# Patient Record
Sex: Female | Born: 1945 | Race: Black or African American | Hispanic: No | Marital: Married | Smoking: Current every day smoker
Health system: Southern US, Community
[De-identification: ages and names within clinical notes are randomized; demographics above are authoritative.]

## PROBLEM LIST (undated history)

## (undated) DIAGNOSIS — I459 Conduction disorder, unspecified: Secondary | ICD-10-CM

## (undated) DIAGNOSIS — E785 Hyperlipidemia, unspecified: Secondary | ICD-10-CM

## (undated) DIAGNOSIS — I509 Heart failure, unspecified: Secondary | ICD-10-CM

## (undated) DIAGNOSIS — J45909 Unspecified asthma, uncomplicated: Secondary | ICD-10-CM

## (undated) DIAGNOSIS — I10 Essential (primary) hypertension: Secondary | ICD-10-CM

## (undated) DIAGNOSIS — I639 Cerebral infarction, unspecified: Secondary | ICD-10-CM

## (undated) DIAGNOSIS — E119 Type 2 diabetes mellitus without complications: Secondary | ICD-10-CM

## (undated) HISTORY — PX: ABDOMINAL HYSTERECTOMY: SHX81

## (undated) HISTORY — PX: BREAST LUMPECTOMY: SHX2

## (undated) HISTORY — PX: EP IMPLANTABLE DEVICE: SHX172B

## (undated) HISTORY — PX: PACEMAKER INSERTION: SHX728

---

## 1998-06-22 ENCOUNTER — Other Ambulatory Visit: Admission: RE | Admit: 1998-06-22 | Discharge: 1998-06-22 | Payer: Self-pay | Admitting: General Surgery

## 1998-12-22 ENCOUNTER — Other Ambulatory Visit: Admission: RE | Admit: 1998-12-22 | Discharge: 1998-12-22 | Payer: Self-pay | Admitting: Obstetrics and Gynecology

## 1999-12-24 ENCOUNTER — Emergency Department (HOSPITAL_COMMUNITY): Admission: EM | Admit: 1999-12-24 | Discharge: 1999-12-25 | Payer: Self-pay | Admitting: *Deleted

## 2000-01-12 ENCOUNTER — Encounter: Admission: RE | Admit: 2000-01-12 | Discharge: 2000-04-11 | Payer: Self-pay | Admitting: Emergency Medicine

## 2000-01-31 ENCOUNTER — Other Ambulatory Visit: Admission: RE | Admit: 2000-01-31 | Discharge: 2000-01-31 | Payer: Self-pay | Admitting: Obstetrics and Gynecology

## 2000-05-17 ENCOUNTER — Encounter: Admission: RE | Admit: 2000-05-17 | Discharge: 2000-08-15 | Payer: Self-pay | Admitting: Emergency Medicine

## 2001-02-01 ENCOUNTER — Emergency Department (HOSPITAL_COMMUNITY): Admission: EM | Admit: 2001-02-01 | Discharge: 2001-02-02 | Payer: Self-pay | Admitting: Emergency Medicine

## 2001-02-02 ENCOUNTER — Encounter: Payer: Self-pay | Admitting: Emergency Medicine

## 2001-02-20 ENCOUNTER — Other Ambulatory Visit: Admission: RE | Admit: 2001-02-20 | Discharge: 2001-02-20 | Payer: Self-pay | Admitting: Obstetrics and Gynecology

## 2002-06-18 ENCOUNTER — Other Ambulatory Visit: Admission: RE | Admit: 2002-06-18 | Discharge: 2002-06-18 | Payer: Self-pay | Admitting: Obstetrics and Gynecology

## 2007-03-15 ENCOUNTER — Other Ambulatory Visit: Payer: Self-pay

## 2007-03-15 ENCOUNTER — Inpatient Hospital Stay: Payer: Self-pay | Admitting: Cardiology

## 2007-03-16 ENCOUNTER — Other Ambulatory Visit: Payer: Self-pay

## 2007-03-17 ENCOUNTER — Other Ambulatory Visit: Payer: Self-pay

## 2007-03-18 ENCOUNTER — Other Ambulatory Visit: Payer: Self-pay

## 2007-03-19 ENCOUNTER — Other Ambulatory Visit: Payer: Self-pay

## 2009-02-05 ENCOUNTER — Encounter: Admission: RE | Admit: 2009-02-05 | Discharge: 2009-02-05 | Payer: Self-pay | Admitting: Internal Medicine

## 2011-11-27 ENCOUNTER — Other Ambulatory Visit: Payer: Self-pay | Admitting: Internal Medicine

## 2011-11-27 DIAGNOSIS — R2681 Unsteadiness on feet: Secondary | ICD-10-CM

## 2011-11-27 DIAGNOSIS — R51 Headache: Secondary | ICD-10-CM

## 2011-11-29 ENCOUNTER — Ambulatory Visit
Admission: RE | Admit: 2011-11-29 | Discharge: 2011-11-29 | Disposition: A | Discharge status: Home / Self Care | Payer: Medicare Other | Source: Ambulatory Visit | Attending: Internal Medicine | Admitting: Internal Medicine

## 2011-11-29 DIAGNOSIS — R51 Headache: Secondary | ICD-10-CM

## 2011-11-29 DIAGNOSIS — R2681 Unsteadiness on feet: Secondary | ICD-10-CM

## 2012-01-05 ENCOUNTER — Ambulatory Visit
Admission: RE | Admit: 2012-01-05 | Discharge: 2012-01-05 | Disposition: A | Discharge status: Home / Self Care | Payer: Medicare Other | Source: Ambulatory Visit | Attending: Internal Medicine | Admitting: Internal Medicine

## 2012-01-05 ENCOUNTER — Other Ambulatory Visit: Payer: Self-pay | Admitting: Internal Medicine

## 2012-01-05 DIAGNOSIS — R059 Cough, unspecified: Secondary | ICD-10-CM

## 2012-01-05 DIAGNOSIS — R05 Cough: Secondary | ICD-10-CM

## 2013-07-04 ENCOUNTER — Encounter: Payer: Self-pay | Admitting: Neurology

## 2013-07-08 NOTE — Progress Notes (Signed)
Quick Note:  Very compliant , residual AHi is 1.2 and patient uses CPAP over 5 hours nightly , no Change to settings. Dashanique Brownstein, MD  ______

## 2013-07-09 ENCOUNTER — Encounter: Payer: Self-pay | Admitting: Neurology

## 2013-07-15 ENCOUNTER — Other Ambulatory Visit: Payer: Self-pay | Admitting: Internal Medicine

## 2013-07-15 DIAGNOSIS — M79604 Pain in right leg: Secondary | ICD-10-CM

## 2013-07-17 ENCOUNTER — Ambulatory Visit
Admission: RE | Admit: 2013-07-17 | Discharge: 2013-07-17 | Disposition: A | Discharge status: Home / Self Care | Payer: Medicare Other | Source: Ambulatory Visit | Attending: Internal Medicine | Admitting: Internal Medicine

## 2013-07-17 DIAGNOSIS — M79604 Pain in right leg: Secondary | ICD-10-CM

## 2013-09-16 ENCOUNTER — Ambulatory Visit
Admission: RE | Admit: 2013-09-16 | Discharge: 2013-09-16 | Disposition: A | Discharge status: Home / Self Care | Payer: Medicare Other | Source: Ambulatory Visit | Attending: Internal Medicine | Admitting: Internal Medicine

## 2013-09-16 ENCOUNTER — Other Ambulatory Visit: Payer: Self-pay | Admitting: Internal Medicine

## 2013-09-16 DIAGNOSIS — R062 Wheezing: Secondary | ICD-10-CM

## 2013-09-16 DIAGNOSIS — R059 Cough, unspecified: Secondary | ICD-10-CM

## 2013-09-16 DIAGNOSIS — R0602 Shortness of breath: Secondary | ICD-10-CM

## 2013-09-16 DIAGNOSIS — R05 Cough: Secondary | ICD-10-CM

## 2013-10-15 ENCOUNTER — Other Ambulatory Visit: Payer: Self-pay | Admitting: Internal Medicine

## 2013-10-15 ENCOUNTER — Ambulatory Visit
Admission: RE | Admit: 2013-10-15 | Discharge: 2013-10-15 | Disposition: A | Discharge status: Home / Self Care | Payer: Medicare Other | Source: Ambulatory Visit | Attending: Internal Medicine | Admitting: Internal Medicine

## 2013-10-15 DIAGNOSIS — R0989 Other specified symptoms and signs involving the circulatory and respiratory systems: Secondary | ICD-10-CM

## 2014-04-27 DIAGNOSIS — E785 Hyperlipidemia, unspecified: Secondary | ICD-10-CM | POA: Diagnosis present

## 2014-04-27 DIAGNOSIS — J45909 Unspecified asthma, uncomplicated: Secondary | ICD-10-CM | POA: Diagnosis present

## 2014-04-27 DIAGNOSIS — E669 Obesity, unspecified: Secondary | ICD-10-CM | POA: Diagnosis present

## 2014-04-27 DIAGNOSIS — I509 Heart failure, unspecified: Secondary | ICD-10-CM

## 2014-12-16 ENCOUNTER — Ambulatory Visit: Payer: Self-pay | Admitting: Neurology

## 2015-01-07 ENCOUNTER — Other Ambulatory Visit: Payer: Self-pay | Admitting: Internal Medicine

## 2015-02-03 ENCOUNTER — Ambulatory Visit: Payer: Self-pay | Admitting: Cardiology

## 2015-02-03 DIAGNOSIS — E119 Type 2 diabetes mellitus without complications: Secondary | ICD-10-CM

## 2015-02-03 DIAGNOSIS — I10 Essential (primary) hypertension: Secondary | ICD-10-CM

## 2015-02-03 DIAGNOSIS — I509 Heart failure, unspecified: Secondary | ICD-10-CM

## 2015-02-11 ENCOUNTER — Ambulatory Visit: Payer: Self-pay | Admitting: Cardiology

## 2015-04-18 NOTE — Op Note (Signed)
PATIENT NAME:  Claire Mcgrath, Claire Mcgrath MR#:  [id number] DATE OF BIRTH:  Oct 25, 1946  DATE OF PROCEDURE:  02/11/2015  PRIMARY CARE PHYSICIAN: Ralene Okoy Moreira, M.D.    PREPROCEDURE DIAGNOSES:  1. Type 2 second degree atrioventricular block.  2. Elective replacement indication.   PROCEDURE: Dual chamber pacemaker generator change-out.   POSTPROCEDURE DIAGNOSIS: Atrial sensing with ventricular pacing.   INDICATION: The patient is a 69 year old female, who is status post dual-chamber pacemaker for type 2 second degree AV block. Recent pacemaker interrogation has shown that the pacemaker was at elective replacement indication without ventricular escape rhythm via interrogation. The procedure, risks, benefits and alternatives of pacemaker generator change-out were explained to the patient and informed written consent was obtained.   DESCRIPTION OF PROCEDURE: She was brought to the operating room in a fasting state. The left pectoral region was prepped and draped in the usual sterile manner. Anesthesia was obtained with 1% Xylocaine locally. A 6 cm incision was performed over the left pectoral region. The old pacemaker generator was retrieved by electrocautery and blunt dissection. The leads were disconnected from the old pacemaker generator and connected to a new dual-chamber rate responsive pacemaker generator (Medtronic Adapta [id number]). The pacemaker pocket was irrigated with gentamicin solution. The new pacemaker generator was positioned into the pocket. The pocket was closed with 2-0 and 4-0 Vicryl, respectively. Steri-Strips and a pressure dressing was applied.    ____________________________ Marcina MillardAlexander Ziasia Lenoir, MD ap:JT D: 02/11/2015 12:48:31 ET T: 02/12/2015 11:22:47 ET JOB#: [id number]  cc: Marcina MillardAlexander Florella Mcneese, MD, <Dictator> Marcina MillardALEXANDER Thalia Turkington MD ELECTRONICALLY SIGNED 02/23/2015 10:08

## 2016-09-04 ENCOUNTER — Observation Stay (HOSPITAL_COMMUNITY)
Admission: EM | Admit: 2016-09-04 | Discharge: 2016-09-06 | Disposition: A | Discharge status: Home / Self Care | Payer: Medicare Other | Attending: Family Medicine | Admitting: Family Medicine

## 2016-09-04 ENCOUNTER — Encounter (HOSPITAL_COMMUNITY): Payer: Self-pay | Admitting: Emergency Medicine

## 2016-09-04 ENCOUNTER — Emergency Department (HOSPITAL_COMMUNITY): Payer: Medicare Other

## 2016-09-04 DIAGNOSIS — I459 Conduction disorder, unspecified: Secondary | ICD-10-CM | POA: Diagnosis not present

## 2016-09-04 DIAGNOSIS — I509 Heart failure, unspecified: Secondary | ICD-10-CM | POA: Diagnosis not present

## 2016-09-04 DIAGNOSIS — E119 Type 2 diabetes mellitus without complications: Secondary | ICD-10-CM

## 2016-09-04 DIAGNOSIS — I69815 Cognitive social or emotional deficit following other cerebrovascular disease: Secondary | ICD-10-CM

## 2016-09-04 DIAGNOSIS — Z91041 Radiographic dye allergy status: Secondary | ICD-10-CM | POA: Insufficient documentation

## 2016-09-04 DIAGNOSIS — Z794 Long term (current) use of insulin: Secondary | ICD-10-CM | POA: Insufficient documentation

## 2016-09-04 DIAGNOSIS — I700 Atherosclerosis of aorta: Secondary | ICD-10-CM | POA: Insufficient documentation

## 2016-09-04 DIAGNOSIS — M6289 Other specified disorders of muscle: Secondary | ICD-10-CM

## 2016-09-04 DIAGNOSIS — Z7982 Long term (current) use of aspirin: Secondary | ICD-10-CM | POA: Diagnosis not present

## 2016-09-04 DIAGNOSIS — J439 Emphysema, unspecified: Secondary | ICD-10-CM | POA: Diagnosis not present

## 2016-09-04 DIAGNOSIS — I639 Cerebral infarction, unspecified: Secondary | ICD-10-CM | POA: Diagnosis present

## 2016-09-04 DIAGNOSIS — Z91013 Allergy to seafood: Secondary | ICD-10-CM | POA: Diagnosis not present

## 2016-09-04 DIAGNOSIS — J45909 Unspecified asthma, uncomplicated: Secondary | ICD-10-CM | POA: Insufficient documentation

## 2016-09-04 DIAGNOSIS — E042 Nontoxic multinodular goiter: Secondary | ICD-10-CM | POA: Diagnosis not present

## 2016-09-04 DIAGNOSIS — Z8249 Family history of ischemic heart disease and other diseases of the circulatory system: Secondary | ICD-10-CM | POA: Diagnosis not present

## 2016-09-04 DIAGNOSIS — E669 Obesity, unspecified: Secondary | ICD-10-CM | POA: Insufficient documentation

## 2016-09-04 DIAGNOSIS — Z950 Presence of cardiac pacemaker: Secondary | ICD-10-CM | POA: Insufficient documentation

## 2016-09-04 DIAGNOSIS — E785 Hyperlipidemia, unspecified: Secondary | ICD-10-CM | POA: Diagnosis present

## 2016-09-04 DIAGNOSIS — I69920 Aphasia following unspecified cerebrovascular disease: Secondary | ICD-10-CM

## 2016-09-04 DIAGNOSIS — F17210 Nicotine dependence, cigarettes, uncomplicated: Secondary | ICD-10-CM | POA: Insufficient documentation

## 2016-09-04 DIAGNOSIS — Z6837 Body mass index (BMI) 37.0-37.9, adult: Secondary | ICD-10-CM | POA: Insufficient documentation

## 2016-09-04 DIAGNOSIS — I10 Essential (primary) hypertension: Secondary | ICD-10-CM | POA: Diagnosis present

## 2016-09-04 DIAGNOSIS — I110 Hypertensive heart disease with heart failure: Secondary | ICD-10-CM | POA: Diagnosis not present

## 2016-09-04 DIAGNOSIS — Z823 Family history of stroke: Secondary | ICD-10-CM | POA: Diagnosis not present

## 2016-09-04 DIAGNOSIS — R531 Weakness: Secondary | ICD-10-CM | POA: Diagnosis present

## 2016-09-04 DIAGNOSIS — Z91018 Allergy to other foods: Secondary | ICD-10-CM | POA: Diagnosis not present

## 2016-09-04 HISTORY — DX: Type 2 diabetes mellitus without complications: E11.9

## 2016-09-04 HISTORY — DX: Conduction disorder, unspecified: I45.9

## 2016-09-04 HISTORY — DX: Hyperlipidemia, unspecified: E78.5

## 2016-09-04 HISTORY — DX: Unspecified asthma, uncomplicated: J45.909

## 2016-09-04 HISTORY — DX: Essential (primary) hypertension: I10

## 2016-09-04 HISTORY — DX: Heart failure, unspecified: I50.9

## 2016-09-04 LAB — DIFFERENTIAL
BASOS PCT: 1 %
Basophils Absolute: 0.1 10*3/uL (ref 0.0–0.1)
EOS PCT: 3 %
Eosinophils Absolute: 0.3 10*3/uL (ref 0.0–0.7)
LYMPHS PCT: 38 %
Lymphs Abs: 4.6 10*3/uL — ABNORMAL HIGH (ref 0.7–4.0)
MONO ABS: 0.8 10*3/uL (ref 0.1–1.0)
Monocytes Relative: 7 %
NEUTROS ABS: 6.4 10*3/uL (ref 1.7–7.7)
NEUTROS PCT: 53 %

## 2016-09-04 LAB — I-STAT CHEM 8, ED
BUN: 19 mg/dL (ref 6–20)
CHLORIDE: 103 mmol/L (ref 101–111)
Calcium, Ion: 1.15 mmol/L (ref 1.15–1.40)
Creatinine, Ser: 1 mg/dL (ref 0.44–1.00)
Glucose, Bld: 134 mg/dL — ABNORMAL HIGH (ref 65–99)
HEMATOCRIT: 42 % (ref 36.0–46.0)
HEMOGLOBIN: 14.3 g/dL (ref 12.0–15.0)
POTASSIUM: 4.3 mmol/L (ref 3.5–5.1)
SODIUM: 141 mmol/L (ref 135–145)
TCO2: 28 mmol/L (ref 0–100)

## 2016-09-04 LAB — COMPREHENSIVE METABOLIC PANEL
ALBUMIN: 3.9 g/dL (ref 3.5–5.0)
ALK PHOS: 52 U/L (ref 38–126)
ALT: 17 U/L (ref 14–54)
ANION GAP: 6 (ref 5–15)
AST: 16 U/L (ref 15–41)
BUN: 15 mg/dL (ref 6–20)
CALCIUM: 9.6 mg/dL (ref 8.9–10.3)
CHLORIDE: 104 mmol/L (ref 101–111)
CO2: 28 mmol/L (ref 22–32)
Creatinine, Ser: 0.98 mg/dL (ref 0.44–1.00)
GFR calc non Af Amer: 57 mL/min — ABNORMAL LOW (ref >60)
GLUCOSE: 137 mg/dL — AB (ref 65–99)
Potassium: 4.4 mmol/L (ref 3.5–5.1)
SODIUM: 138 mmol/L (ref 135–145)
Total Bilirubin: 0.4 mg/dL (ref 0.3–1.2)
Total Protein: 7.5 g/dL (ref 6.5–8.1)

## 2016-09-04 LAB — CBC
HCT: 41.3 % (ref 36.0–46.0)
Hemoglobin: 12.9 g/dL (ref 12.0–15.0)
MCH: 26.6 pg (ref 26.0–34.0)
MCHC: 31.2 g/dL (ref 30.0–36.0)
MCV: 85.2 fL (ref 78.0–100.0)
PLATELETS: 320 10*3/uL (ref 150–400)
RBC: 4.85 MIL/uL (ref 3.87–5.11)
RDW: 15.6 % — AB (ref 11.5–15.5)
WBC: 12.2 10*3/uL — AB (ref 4.0–10.5)

## 2016-09-04 LAB — I-STAT TROPONIN, ED: Troponin i, poc: 0 ng/mL (ref 0.00–0.08)

## 2016-09-04 LAB — PROTIME-INR
INR: 0.95
PROTHROMBIN TIME: 12.7 s (ref 11.4–15.2)

## 2016-09-04 LAB — CBG MONITORING, ED: GLUCOSE-CAPILLARY: 127 mg/dL — AB (ref 65–99)

## 2016-09-04 LAB — APTT: aPTT: 28 seconds (ref 24–36)

## 2016-09-04 MED ORDER — METFORMIN HCL 500 MG PO TABS: 1000.0000 mg | ORAL_TABLET | Freq: Every day | ORAL | Status: DC

## 2016-09-04 MED ORDER — LIRAGLUTIDE 18 MG/3ML ~~LOC~~ SOPN: 1.2000 mg | PEN_INJECTOR | Freq: Every morning | SUBCUTANEOUS | Status: DC

## 2016-09-04 MED ORDER — ASPIRIN EC 325 MG PO TBEC
325.0000 mg | DELAYED_RELEASE_TABLET | Freq: Every day | ORAL | Status: DC
  Administered 2016-09-05 (×2): 325 mg via ORAL
  Filled 2016-09-04 (×2): qty 1

## 2016-09-04 MED ORDER — PRAVASTATIN SODIUM 10 MG PO TABS
10.0000 mg | ORAL_TABLET | Freq: Every day | ORAL | Status: DC
Start: 2016-09-05 — End: 2016-09-06
  Administered 2016-09-05 (×2): 10 mg via ORAL
  Filled 2016-09-04 (×2): qty 1

## 2016-09-04 MED ORDER — INSULIN LISPRO PROT & LISPRO (75-25 MIX) 100 UNIT/ML KWIKPEN: 20.0000 [IU] | PEN_INJECTOR | Freq: Two times a day (BID) | SUBCUTANEOUS | Status: DC

## 2016-09-04 MED ORDER — INSULIN ASPART 100 UNIT/ML ~~LOC~~ SOLN
0.0000 [IU] | Freq: Three times a day (TID) | SUBCUTANEOUS | Status: DC
  Administered 2016-09-05: 11 [IU] via SUBCUTANEOUS
  Administered 2016-09-05: 15 [IU] via SUBCUTANEOUS

## 2016-09-04 MED ORDER — PANTOPRAZOLE SODIUM 40 MG PO TBEC
80.0000 mg | DELAYED_RELEASE_TABLET | Freq: Every day | ORAL | Status: DC
  Administered 2016-09-05 – 2016-09-06 (×2): 80 mg via ORAL
  Filled 2016-09-04 (×2): qty 2

## 2016-09-04 MED ORDER — FLUTICASONE FUROATE-VILANTEROL 100-25 MCG/INH IN AEPB
1.0000 | INHALATION_SPRAY | Freq: Every day | RESPIRATORY_TRACT | Status: DC
  Administered 2016-09-05 – 2016-09-06 (×2): 1 via RESPIRATORY_TRACT
  Filled 2016-09-04: qty 28

## 2016-09-04 MED ORDER — INSULIN NPH (HUMAN) (ISOPHANE) 100 UNIT/ML ~~LOC~~ SUSP
12.0000 [IU] | Freq: Two times a day (BID) | SUBCUTANEOUS | Status: DC
  Administered 2016-09-05 (×2): 12 [IU] via SUBCUTANEOUS
  Filled 2016-09-04: qty 10

## 2016-09-04 NOTE — ED Triage Notes (Signed)
Pt states she started having weakness in bilateral legs on Saturday. States she woke up with her legs feeling weak. Pt feels that she is leaning to the right and keeps dropping things in her right hand.  Pt also had headache with sinus congestion. Denies any other symptoms. Neurally intact.

## 2016-09-04 NOTE — ED Notes (Signed)
Pt has not ambulated while being in the room. IV placement pending. Neurologist and Admitting MD at bedside. Pt is pleasant and optimistic. Pt alert and oriented x4.

## 2016-09-04 NOTE — Consult Note (Signed)
Admission H&P    Chief Complaint: New onset right-sided weakness.  HPI: Claire Mcgrath is an 70 y.o. female history diabetes mellitus, hypertension, hyperlipidemia, asthma and CHF, presenting with weakness involving lower extremities, right greater than left as well as right upper extremity weakness. Lower extremity symptoms were first noticed 2 days ago. She noticed she had particular problems with her right lower extremity and to a lesser extent left upper extremity. One day prior to admission she noticed weakness of her right lower extremity with dropping objects as well as difficulty with writing. She had no change in speech. No facial droop is been reported. She has no previous history of stroke nor TIA. She takes aspirin daily. 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  LSN: 09/02/2016 tPA Given: No: Minimal deficits; beyond time window for treatment consideration mRankin:  Past Medical History:  Diagnosis Date  . Asthma   . CHF (congestive heart failure) ([place])   . Diabetes mellitus without complication ([place])   . Heart block   . Hyperlipidemia   . Hypertension     Past Surgical History:  Procedure Laterality Date  . ABDOMINAL HYSTERECTOMY    . BREAST LUMPECTOMY     left  . EP IMPLANTABLE DEVICE    . PACEMAKER INSERTION      Family History  Problem Relation Age of Onset  . Congestive Heart Failure Mother   . Heart attack Father   . Stroke Maternal Aunt    Social History:  reports that she has been smoking Cigarettes.  She has been smoking about 1.00 pack per day. She has never used smokeless tobacco. She reports that she drinks alcohol. Her drug history is not on file.  Allergies:  Allergies  Allergen Reactions  . Kiwi Extract Anaphylaxis    Feel my throat close up  . Iodine Itching and Swelling       . Shrimp [Shellfish Allergy] Itching and Swelling    Medications: Preadmission medications were reviewed by  me.  ROS: History obtained from the patient  General ROS: negative for - chills, fatigue, fever, night sweats, weight gain or weight loss Psychological ROS: negative for - behavioral disorder, hallucinations, memory difficulties, mood swings or suicidal ideation Ophthalmic ROS: negative for - blurry vision, double vision, eye pain or loss of vision ENT ROS: negative for - epistaxis, nasal discharge, oral lesions, sore throat, tinnitus or vertigo Allergy and Immunology ROS: negative for - hives or itchy/watery eyes Hematological and Lymphatic ROS: negative for - bleeding problems, bruising or swollen lymph nodes Endocrine ROS: negative for - galactorrhea, hair pattern changes, polydipsia/polyuria or temperature intolerance Respiratory ROS: negative for - cough, hemoptysis, shortness of breath or wheezing Cardiovascular ROS: negative for - chest pain, dyspnea on exertion, edema or irregular heartbeat Gastrointestinal ROS: negative for - abdominal pain, diarrhea, hematemesis, nausea/vomiting or stool incontinence Genito-Urinary ROS: negative for - dysuria, hematuria, incontinence or urinary frequency/urgency Musculoskeletal ROS: Low back pain Neurological ROS: as noted in HPI Dermatological ROS: negative for rash and skin lesion changes  Physical Examination: Blood pressure 172/55, pulse 67, resp. rate 18, height 5' 4"  (1.626 m), weight 96.6 kg (213 lb), SpO2 98 %.  HEENT-  Normocephalic, no lesions, without obvious abnormality.  Normal external eye and conjunctiva.  Normal TM's bilaterally.  Normal auditory canals and external ears. Normal external nose, mucus membranes and septum.  Normal pharynx. Neck supple with no masses, nodes, nodules or enlargement. Cardiovascular - regular rate and rhythm, S1,  S2 normal, no murmur, click, rub or gallop Lungs - mild expiratory wheezing, otherwise unremarkable breath sounds Abdomen - soft, non-tender; bowel sounds normal; no masses,  no  organomegaly Extremities - no joint deformities, effusion, or inflammation  Neurologic Examination: Mental Status: Alert, oriented, thought content appropriate.  Speech fluent without evidence of aphasia. Able to follow commands without difficulty. Cranial Nerves: II-Visual fields were normal. III/IV/VI-Pupils were equal and reacted normally to light. Extraocular movements were full and conjugate.    V/VII-no facial numbness and no facial weakness. VIII-normal. X-normal speech and symmetrical palatal movement. XI: trapezius strength/neck flexion strength normal bilaterally XII-midline tongue extension with normal strength. Motor: Reduced right hand grip strength compared to left hand; no drift of right extremities. Sensory: Normal throughout. Deep Tendon Reflexes: Absent in lower extremities. Plantars: Flexor bilaterally Cerebellar: Slightly impaired coordination of right upper extremity compared to left. Carotid auscultation: Normal  Results for orders placed or performed during the hospital encounter of 09/04/16 (from the past 48 hour(s))  Protime-INR     Status: None   Collection Time: 09/04/16  4:26 PM  Result Value Ref Range   Prothrombin Time 12.7 11.4 - 15.2 seconds   INR 0.95   APTT     Status: None   Collection Time: 09/04/16  4:26 PM  Result Value Ref Range   aPTT 28 24 - 36 seconds  CBC     Status: Abnormal   Collection Time: 09/04/16  4:26 PM  Result Value Ref Range   WBC 12.2 (H) 4.0 - 10.5 K/uL   RBC 4.85 3.87 - 5.11 MIL/uL   Hemoglobin 12.9 12.0 - 15.0 g/dL   HCT 41.3 36.0 - 46.0 %   MCV 85.2 78.0 - 100.0 fL   MCH 26.6 26.0 - 34.0 pg   MCHC 31.2 30.0 - 36.0 g/dL   RDW 15.6 (H) 11.5 - 15.5 %   Platelets 320 150 - 400 K/uL  Differential     Status: Abnormal   Collection Time: 09/04/16  4:26 PM  Result Value Ref Range   Neutrophils Relative % 53 %   Neutro Abs 6.4 1.7 - 7.7 K/uL   Lymphocytes Relative 38 %   Lymphs Abs 4.6 (H) 0.7 - 4.0 K/uL   Monocytes  Relative 7 %   Monocytes Absolute 0.8 0.1 - 1.0 K/uL   Eosinophils Relative 3 %   Eosinophils Absolute 0.3 0.0 - 0.7 K/uL   Basophils Relative 1 %   Basophils Absolute 0.1 0.0 - 0.1 K/uL  Comprehensive metabolic panel     Status: Abnormal   Collection Time: 09/04/16  4:26 PM  Result Value Ref Range   Sodium 138 135 - 145 mmol/L   Potassium 4.4 3.5 - 5.1 mmol/L   Chloride 104 101 - 111 mmol/L   CO2 28 22 - 32 mmol/L   Glucose, Bld 137 (H) 65 - 99 mg/dL   BUN 15 6 - 20 mg/dL   Creatinine, Ser 0.98 0.44 - 1.00 mg/dL   Calcium 9.6 8.9 - 10.3 mg/dL   Total Protein 7.5 6.5 - 8.1 g/dL   Albumin 3.9 3.5 - 5.0 g/dL   AST 16 15 - 41 U/L   ALT 17 14 - 54 U/L   Alkaline Phosphatase 52 38 - 126 U/L   Total Bilirubin 0.4 0.3 - 1.2 mg/dL   GFR calc non Af Amer 57 (L) >60 mL/min   GFR calc Af Amer >60 >60 mL/min    Comment: (NOTE) The eGFR has been calculated  using the CKD EPI equation. This calculation has not been validated in all clinical situations. eGFR's persistently <60 mL/min signify possible Chronic Kidney Disease.    Anion gap 6 5 - 15  I-stat troponin, ED     Status: None   Collection Time: 09/04/16  4:37 PM  Result Value Ref Range   Troponin i, poc 0.00 0.00 - 0.08 ng/mL   Comment 3            Comment: Due to the release kinetics of cTnI, a negative result within the first hours of the onset of symptoms does not rule out myocardial infarction with certainty. If myocardial infarction is still suspected, repeat the test at appropriate intervals.   I-Stat Chem 8, ED     Status: Abnormal   Collection Time: 09/04/16  4:39 PM  Result Value Ref Range   Sodium 141 135 - 145 mmol/L   Potassium 4.3 3.5 - 5.1 mmol/L   Chloride 103 101 - 111 mmol/L   BUN 19 6 - 20 mg/dL   Creatinine, Ser 1.00 0.44 - 1.00 mg/dL   Glucose, Bld 134 (H) 65 - 99 mg/dL   Calcium, Ion 1.15 1.15 - 1.40 mmol/L   TCO2 28 0 - 100 mmol/L   Hemoglobin 14.3 12.0 - 15.0 g/dL   HCT 42.0 36.0 - 46.0 %  CBG  monitoring, ED     Status: Abnormal   Collection Time: 09/04/16 10:00 PM  Result Value Ref Range   Glucose-Capillary 127 (H) 65 - 99 mg/dL   Ct Head Wo Contrast  Result Date: 09/04/2016 CLINICAL DATA:  Stroke symptoms.  Wobbly legs. EXAM: CT HEAD WITHOUT CONTRAST TECHNIQUE: Contiguous axial images were obtained from the base of the skull through the vertex without intravenous contrast. COMPARISON:  11/29/2011 FINDINGS: Brain: There is a subcentimeter, ill-defined low-density in the posterior limb left internal capsule extending towards the thalamus. No evidence of cortical infarct. No hemorrhage, hydrocephalus, or mass lesion. Vascular: Atherosclerotic calcification. Skull: Normal. Negative for fracture or focal lesion. Sinuses/Orbits: Bilateral cataract resection Other: None. IMPRESSION: Age-indeterminate lacunar infarct in the left posterior internal capsule. Electronically Signed   By: Monte Fantasia M.D.   On: 09/04/2016 16:48    Assessment: 70 y.o. female with multiple risk factors for stroke presenting with probable acute subcortical left ischemic infarction.  Stroke Risk Factors - diabetes mellitus, hyperlipidemia and hypertension  Plan: 1. HgbA1c, fasting lipid panel 2. CT angiogram of head and neck with contrast 3. PT consult, OT consult, Speech consult 4. Echocardiogram 5. Prophylactic therapy-Antiplatelet med: Aspirin  6. Risk factor modification 7. Telemetry monitoring  C.R. Nicole Kindred, MD  [facility] [phone]  09/04/2016, 11:11 PM

## 2016-09-04 NOTE — ED Notes (Signed)
Left pt to get into gown. Instructed pt to use call bell when ready for me to return

## 2016-09-04 NOTE — ED Provider Notes (Signed)
[facility]-EMERGENCY DEPT Provider Note   CSN: [id number] Arrival date & time: 09/04/16  1514     History   Chief Complaint Chief Complaint  Patient presents with  . Extremity Weakness    HPI Claire Mcgrath is a 70 y.o. female.  The history is provided by the patient.  Extremity Weakness  This is a new problem. The current episode started 2 days ago. The problem occurs constantly. The problem has been gradually worsening. Associated symptoms include headaches (last week, resolved). Pertinent negatives include no chest pain, no abdominal pain and no shortness of breath. Nothing aggravates the symptoms. Nothing relieves the symptoms. She has tried nothing for the symptoms.    Past Medical History:  Diagnosis Date  . Asthma   . CHF (congestive heart failure) ([facility])   . Diabetes mellitus without complication ([facility])   . Hyperlipidemia   . Hypertension     There are no active problems to display for this patient.   Past Surgical History:  Procedure Laterality Date  . ABDOMINAL HYSTERECTOMY    . BREAST LUMPECTOMY     left  . EP IMPLANTABLE DEVICE      OB History    No data available       Home Medications    Prior to Admission medications   Not on File    Family History History reviewed. No pertinent family history.  Social History Social History  Substance Use Topics  . Smoking status: Current Every Day Smoker    Packs/day: 1.00    Types: Cigarettes  . Smokeless tobacco: Never Used  . Alcohol use Yes     Comment: occassional     Allergies   Kiwi extract and Shrimp [shellfish allergy]   Review of Systems Review of Systems  Respiratory: Negative for shortness of breath.   Cardiovascular: Negative for chest pain.  Gastrointestinal: Negative for abdominal pain.  Musculoskeletal: Positive for extremity weakness.  Neurological: Positive for headaches (last week, resolved).  All other systems reviewed and are negative.    Physical Exam Updated Vital  Signs BP (!) 126/52 (BP Location: Right Arm)   Pulse 64   Resp 16   Ht 5\' 4"  (1.626 m)   Wt 213 lb (96.6 kg)   SpO2 99%   BMI 36.56 kg/m   Physical Exam  Constitutional: She is oriented to person, place, and time. She appears well-developed and well-nourished. No distress.  HENT:  Head: Normocephalic.  Eyes: Conjunctivae are normal.  Neck: Neck supple. No tracheal deviation present.  Cardiovascular: Normal rate, regular rhythm and normal heart sounds.   Pulmonary/Chest: Effort normal and breath sounds normal. No respiratory distress.  Abdominal: Soft. She exhibits no distension.  Neurological: She is alert and oriented to person, place, and time. She has normal strength. No cranial nerve deficit or sensory deficit. Coordination normal. GCS eye subscore is 4. GCS verbal subscore is 5. GCS motor subscore is 6.  Skin: Skin is warm and dry.  Psychiatric: She has a normal mood and affect.     ED Treatments / Results  Labs (all labs ordered are listed, but only abnormal results are displayed) Labs Reviewed  CBC - Abnormal; Notable for the following:       Result Value   WBC 12.2 (*)    RDW 15.6 (*)    All other components within normal limits  DIFFERENTIAL - Abnormal; Notable for the following:    Lymphs Abs 4.6 (*)    All other components within normal limits  COMPREHENSIVE METABOLIC PANEL - Abnormal; Notable for the following:    Glucose, Bld 137 (*)    GFR calc non Af Amer 57 (*)    All other components within normal limits  CBG MONITORING, ED - Abnormal; Notable for the following:    Glucose-Capillary 127 (*)    All other components within normal limits  I-STAT CHEM 8, ED - Abnormal; Notable for the following:    Glucose, Bld 134 (*)    All other components within normal limits  PROTIME-INR  APTT  I-STAT TROPOININ, ED    EKG  EKG Interpretation  Date/Time:  Monday September 04 2016 16:19:47 EDT Ventricular Rate:  81 PR Interval:  172 QRS Duration: 166 QT  Interval:  444 QTC Calculation: 515 R Axis:   -73 Text Interpretation:  Atrial-sensed ventricular-paced rhythm with occasional Premature ventricular complexes Abnormal ECG Confirmed by Vipul Cafarelli MD, Miarose Lippert [phone]) on 09/04/2016 9:54:39 PM       Radiology Ct Head Wo Contrast  Result Date: 09/04/2016 CLINICAL DATA:  Stroke symptoms.  Wobbly legs. EXAM: CT HEAD WITHOUT CONTRAST TECHNIQUE: Contiguous axial images were obtained from the base of the skull through the vertex without intravenous contrast. COMPARISON:  11/29/2011 FINDINGS: Brain: There is a subcentimeter, ill-defined low-density in the posterior limb left internal capsule extending towards the thalamus. No evidence of cortical infarct. No hemorrhage, hydrocephalus, or mass lesion. Vascular: Atherosclerotic calcification. Skull: Normal. Negative for fracture or focal lesion. Sinuses/Orbits: Bilateral cataract resection Other: None. IMPRESSION: Age-indeterminate lacunar infarct in the left posterior internal capsule. Electronically Signed   By: Marnee Spring M.D.   On: 09/04/2016 16:48    Procedures Procedures (including critical care time)  Medications Ordered in ED Medications - No data to display   Initial Impression / Assessment and Plan / ED Course  I have reviewed the triage vital signs and the nursing notes.  Pertinent labs & imaging results that were available during my care of the patient were reviewed by me and considered in my medical decision making (see chart for details).  Clinical Course    69 y.o. female presents with 3 days of ongoing right worse than left leg weakness and difficulty with grip in right hand. CT concerning for lacunar infarct in left internal capsule that would fit with symptoms of acute or subacute stroke. Outside of window for any intervention. Pt unable to undergo MRI d/t pacemaker, discussed with neurology and will admit for further stroke workup. Hospitalist was consulted for admission and will  see the patient in the emergency department.   Final Clinical Impressions(s) / ED Diagnoses   Final diagnoses:  Right sided weakness    New Prescriptions New Prescriptions   No medications on file     [name], MD 09/05/16 0101

## 2016-09-05 ENCOUNTER — Observation Stay (HOSPITAL_COMMUNITY): Payer: Medicare Other

## 2016-09-05 ENCOUNTER — Encounter (HOSPITAL_COMMUNITY): Payer: Self-pay | Admitting: Radiology

## 2016-09-05 DIAGNOSIS — I639 Cerebral infarction, unspecified: Secondary | ICD-10-CM

## 2016-09-05 DIAGNOSIS — I110 Hypertensive heart disease with heart failure: Secondary | ICD-10-CM | POA: Diagnosis not present

## 2016-09-05 DIAGNOSIS — Z794 Long term (current) use of insulin: Secondary | ICD-10-CM | POA: Diagnosis not present

## 2016-09-05 DIAGNOSIS — E1159 Type 2 diabetes mellitus with other circulatory complications: Secondary | ICD-10-CM

## 2016-09-05 DIAGNOSIS — E119 Type 2 diabetes mellitus without complications: Secondary | ICD-10-CM | POA: Diagnosis not present

## 2016-09-05 DIAGNOSIS — J45909 Unspecified asthma, uncomplicated: Secondary | ICD-10-CM | POA: Diagnosis not present

## 2016-09-05 DIAGNOSIS — I10 Essential (primary) hypertension: Secondary | ICD-10-CM | POA: Diagnosis not present

## 2016-09-05 DIAGNOSIS — M6289 Other specified disorders of muscle: Secondary | ICD-10-CM | POA: Diagnosis not present

## 2016-09-05 LAB — LIPID PANEL
Cholesterol: 146 mg/dL (ref 0–200)
HDL: 46 mg/dL (ref >40)
LDL Cholesterol: 77 mg/dL (ref 0–99)
TRIGLYCERIDES: 116 mg/dL (ref <150)
Total CHOL/HDL Ratio: 3.2 RATIO
VLDL: 23 mg/dL (ref 0–40)

## 2016-09-05 LAB — GLUCOSE, CAPILLARY
GLUCOSE-CAPILLARY: 319 mg/dL — AB (ref 65–99)
Glucose-Capillary: 411 mg/dL — ABNORMAL HIGH (ref 65–99)
Glucose-Capillary: 329 mg/dL — ABNORMAL HIGH (ref 65–99)
Glucose-Capillary: 436 mg/dL — ABNORMAL HIGH (ref 65–99)

## 2016-09-05 LAB — GLUCOSE, RANDOM: Glucose, Bld: 451 mg/dL — ABNORMAL HIGH (ref 65–99)

## 2016-09-05 LAB — HEMOGLOBIN A1C
HEMOGLOBIN A1C: 9.1 % — AB (ref 4.8–5.6)
MEAN PLASMA GLUCOSE: 214 mg/dL

## 2016-09-05 MED ORDER — PREDNISONE 20 MG PO TABS
50.0000 mg | ORAL_TABLET | Freq: Once | ORAL | Status: AC
  Administered 2016-09-05: 50 mg via ORAL
  Filled 2016-09-05: qty 3

## 2016-09-05 MED ORDER — INSULIN ASPART 100 UNIT/ML ~~LOC~~ SOLN
0.0000 [IU] | Freq: Three times a day (TID) | SUBCUTANEOUS | Status: DC
  Administered 2016-09-05 – 2016-09-06 (×3): 15 [IU] via SUBCUTANEOUS

## 2016-09-05 MED ORDER — PREDNISONE 50 MG PO TABS
50.0000 mg | ORAL_TABLET | Freq: Once | ORAL | Status: AC
  Administered 2016-09-05: 50 mg via ORAL
  Filled 2016-09-05: qty 1

## 2016-09-05 MED ORDER — ENOXAPARIN SODIUM 40 MG/0.4ML ~~LOC~~ SOLN
40.0000 mg | SUBCUTANEOUS | Status: DC
  Administered 2016-09-05 – 2016-09-06 (×2): 40 mg via SUBCUTANEOUS
  Filled 2016-09-05 (×2): qty 0.4

## 2016-09-05 MED ORDER — IOPAMIDOL (ISOVUE-370) INJECTION 76%
INTRAVENOUS | Status: AC
  Administered 2016-09-05: 50 mL
  Filled 2016-09-05: qty 50

## 2016-09-05 MED ORDER — LISINOPRIL-HYDROCHLOROTHIAZIDE 10-12.5 MG PO TABS: 1.0000 | ORAL_TABLET | Freq: Every day | ORAL | Status: DC

## 2016-09-05 MED ORDER — STROKE: EARLY STAGES OF RECOVERY BOOK
Freq: Once | Status: AC
  Administered 2016-09-05: 06:00:00
  Filled 2016-09-05: qty 1

## 2016-09-05 MED ORDER — LISINOPRIL 10 MG PO TABS
10.0000 mg | ORAL_TABLET | Freq: Every day | ORAL | Status: DC
  Administered 2016-09-05 (×2): 10 mg via ORAL
  Filled 2016-09-05 (×2): qty 1

## 2016-09-05 MED ORDER — DIPHENHYDRAMINE HCL 25 MG PO CAPS
50.0000 mg | ORAL_CAPSULE | Freq: Once | ORAL | Status: AC
  Administered 2016-09-05: 50 mg via ORAL
  Filled 2016-09-05: qty 2

## 2016-09-05 MED ORDER — HYDROCHLOROTHIAZIDE 12.5 MG PO CAPS
12.5000 mg | ORAL_CAPSULE | Freq: Every day | ORAL | Status: DC
  Administered 2016-09-05 (×2): 12.5 mg via ORAL
  Filled 2016-09-05 (×2): qty 1

## 2016-09-05 NOTE — Progress Notes (Signed)
Confirmed with Dr. Roseanne RenoStewart he does want imaging studies. Contrast 13 hour [facility]: Prednisone 50mg  PO 13, 7, and 1 hour prior to IV contrast dye Benadryl 50mg  PO 1 hour before IV contrast dye

## 2016-09-05 NOTE — Progress Notes (Signed)
STROKE TEAM PROGRESS NOTE   HISTORY OF PRESENT ILLNESS (per record) Claire Mcgrath is an 70 y.o. female history diabetes mellitus, hypertension, hyperlipidemia, asthma and CHF, presenting with weakness involving lower extremities, right greater than left as well as right upper extremity weakness. Lower extremity symptoms were first noticed 2 days ago. She noticed she had particular problems with her right lower extremity and to a lesser extent left upper extremity. One day prior to admission she noticed weakness of her right lower extremity with dropping objects as well as difficulty with writing. She had no change in speech. No facial droop is been reported. She has no previous history of stroke nor TIA. She takes aspirin daily. 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 She was LKW on 09/02/2016, time unknown. Patient was not administered IV t-PA secondary to  Minimal deficits; beyond time window for treatment consideration. She was admitted  for further evaluation and treatment.   SUBJECTIVE (INTERVAL HISTORY) Her ST is at the bedside, no family present. Patient recounted details of HPI with Dr. Pearlean Brownie.  Overall she feels her condition is stable.    OBJECTIVE Temp:  [97.4 F (36.3 C)] 97.4 F (36.3 C) (09/19 0230) Pulse Rate:  [64-75] 75 (09/19 0826) Cardiac Rhythm: Ventricular paced (09/19 0700) Resp:  [16-25] 18 (09/19 0826) BP: (126-175)/(50-73) 151/65 (09/19 0826) SpO2:  [95 %-99 %] 98 % (09/19 0834) Weight:  [96.6 kg (213 lb)-99.9 kg (220 lb 4.8 oz)] 99.9 kg (220 lb 4.8 oz) (09/19 0230)  CBC:  Recent Labs Lab 09/04/16 1626 09/04/16 1639  WBC 12.2*  --   NEUTROABS 6.4  --   HGB 12.9 14.3  HCT 41.3 42.0  MCV 85.2  --   PLT 320  --     Basic Metabolic Panel:  Recent Labs Lab 09/04/16 1626 09/04/16 1639  NA 138 141  K 4.4 4.3  CL 104 103  CO2 28  --   GLUCOSE 137* 134*  BUN 15 19  CREATININE 0.98 1.00  CALCIUM 9.6  --      Lipid Panel:    Component Value Date/Time   CHOL 146 09/05/2016 0405   TRIG 116 09/05/2016 0405   HDL 46 09/05/2016 0405   CHOLHDL 3.2 09/05/2016 0405   VLDL 23 09/05/2016 0405   LDLCALC 77 09/05/2016 0405   HgbA1c: No results found for: HGBA1C Urine Drug Screen: No results found for: LABOPIA, COCAINSCRNUR, LABBENZ, AMPHETMU, THCU, LABBARB    IMAGING  Dg Chest 2 View  Result Date: 09/05/2016 CLINICAL DATA:  Acute onset of left-sided weakness. Initial encounter. EXAM: CHEST  2 VIEW COMPARISON:  Chest radiograph from 02/03/2015 FINDINGS: The lungs are well-aerated. Mild bibasilar opacities likely reflect atelectasis or scarring. There is no evidence of pleural effusion or pneumothorax. The heart is borderline normal in size. A pacemaker is noted at the left chest wall, with leads ending at the right atrium and right ventricle. No acute osseous abnormalities are seen. IMPRESSION: Mild bibasilar airspace opacities likely reflect atelectasis or scarring. Lungs otherwise grossly clear. Electronically Signed   By: Roanna Raider M.D.   On: 09/05/2016 00:29   Ct Head Wo Contrast  Result Date: 09/04/2016 CLINICAL DATA:  Stroke symptoms.  Wobbly legs. EXAM: CT HEAD WITHOUT CONTRAST TECHNIQUE: Contiguous axial images were obtained from the base of the skull through the vertex without intravenous contrast. COMPARISON:  11/29/2011 FINDINGS: Brain: There is a subcentimeter, ill-defined low-density in the posterior limb left internal capsule  extending towards the thalamus. No evidence of cortical infarct. No hemorrhage, hydrocephalus, or mass lesion. Vascular: Atherosclerotic calcification. Skull: Normal. Negative for fracture or focal lesion. Sinuses/Orbits: Bilateral cataract resection Other: None. IMPRESSION: Age-indeterminate lacunar infarct in the left posterior internal capsule. Electronically Signed   By: Marnee Spring M.D.   On: 09/04/2016 16:48     PHYSICAL EXAM Pleasant elderly african  american lady not in distress. . Afebrile. Head is nontraumatic. Neck is supple without bruit.    Cardiac exam no murmur or gallop. Lungs are clear to auscultation. Distal pulses are well felt. Neurological Exam :  Awake alert normal speech and language function. Pupils equal reactive. Fundi were not visualized. Vision acuity seems adequate. Blinks to threat bilaterally. Mild right lower facial asymmetry on smiling. Tongue midline. No upper extremity drift but mild weakness of right grip and intrinsic hand muscles. Mild bilateral proximal leg weakness. Symmetric distal strength. Sensation is intact bilaterally. Deep tendon reflexes are symmetric. Plantars downgoing. Gait was not tested. ASSESSMENT/PLAN [name] is a 70 y.o. female with history of diabetes mellitus, hypertension, hyperlipidemia, asthma and CHF  presenting with new onset R sided weakness. She did not receive IV t-PA due to minimal deficits; beyond time window for treatment consideration.   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 Patient has been premedicated due to dye allergy  2D Echo  pending   LDL 77  HgbA1c pending  Lovenox 40 mg sq daily for VTE prophylaxis  Diet Carb Modified Fluid consistency: Thin; Room service appropriate? Yes  aspirin 81 mg daily prior to admission, now on aspirin 325 mg daily. Consider change to plavix pending CTA results  Patient counseled to be compliant with her antithrombotic medications  Ongoing aggressive stroke risk factor management  Therapy recommendations:  pending   Disposition:  pending  (lives with husband)  Hypertension  Stable  Permissive hypertension (OK if < 220/120) but gradually normalize in 5-7 days  Long-term BP goal normotensive  Hyperlipidemia  Home meds:  pravachol 20, resumed in hospital  LDL 77, goal < 70  Continue statin at  discharge  Diabetes type II  HgbA1c pending, goal < 7.0  Other Stroke Risk Factors  Advanced age  Cigarette smoker, advised to stop smoking  ETOH use, advised to drink no more than 1 drink(s) a day  Obesity, Body mass index is 37.81 kg/m., recommend weight loss, diet and exercise as appropriate   Family hx stroke (maternal aunt)  CHF  Other Active Problems  Patient reports age related cognitive decline.   Hospital day # 0  Rhoderick Moody [facility] See Amion for Pager information 09/05/2016 2:00 PM  I have personally examined this patient, reviewed notes, independently viewed imaging studies, participated in medical decision making and plan of care.ROS completed by me personally and pertinent positives fully documented  I have made any additions or clarifications directly to the above note. Agree with note above.She presented with 2 day h/o bilateral leg weakness with right hand weakness likely due to a small left brain subcortical infarct. CT scan shows age-indeterminate lacuna in the left thalamus and MRI cannot be obtained due to pacemaker. She remains at risk for neurological worsening, recurrent stroke, TIA needs ongoing stroke evaluation. Greater than 50% time during this 35 minute visit was spent on coordination of care and counseling about stroke risk, prevention and treatment.  Discussed with patient and  answered questions  Delia HeadyPramod Toshiko Kemler, MD Medical Director [facility] Pager: [phone] 09/05/2016 4:37 PM    To contact Stroke Continuity provider, please refer to [email or web address]. After hours, contact General Neurology

## 2016-09-05 NOTE — Care Management Obs Status (Signed)
MEDICARE OBSERVATION STATUS NOTIFICATION   Patient Details  Name: Claire SeatSylvia C Reta MRN: [id number] Date of Birth: [birth year]   Medicare Observation Status Notification Given:  Yes (explained observation letter and answered patient's questions)    Antony HasteBennett, Adler Chartrand Harris, RN 09/05/2016, 8:45 PM

## 2016-09-05 NOTE — H&P (Signed)
History and Physical    Claire SeatSylvia C Kulesza ZOX:[id number] DOB: 1946/01/02 DOA: 09/04/2016   PCP: PROVIDER NOT IN SYSTEM Chief Complaint:  Chief Complaint  Patient presents with  . Extremity Weakness    HPI: Claire Mcgrath is a 70 y.o. female with medical history significant of DM, HTN, HLD, CHF, heart block with pacemaker.  Patient presents to the ED with c/o R sided weakness.  Lower extremity symptoms first noticed when she woke up on Sat.  Now with RUE weakness as well.  Symptoms are persistent.  No change in speech, no facial droop reported.    ED Course: CT head shows age indeterminate left posterior internal capsule infarct.  Review of Systems: As per HPI otherwise 10 point review of systems negative.    Past Medical History:  Diagnosis Date  . Asthma   . CHF (congestive heart failure) (HCC)   . Diabetes mellitus without complication (HCC)   . Heart block   . Hyperlipidemia   . Hypertension     Past Surgical History:  Procedure Laterality Date  . ABDOMINAL HYSTERECTOMY    . BREAST LUMPECTOMY     left  . EP IMPLANTABLE DEVICE    . PACEMAKER INSERTION       reports that she has been smoking Cigarettes.  She has been smoking about 1.00 pack per day. She has never used smokeless tobacco. She reports that she drinks alcohol. Her drug history is not on file.  Allergies  Allergen Reactions  . Kiwi Extract Anaphylaxis    Feel my throat close up  . Iodine Itching and Swelling       . Shrimp [Shellfish Allergy] Itching and Swelling    Family History  Problem Relation Age of Onset  . Congestive Heart Failure Mother   . Heart attack Father   . Stroke Maternal Aunt       Prior to Admission medications   Medication Sig Start Date End Date Taking? Authorizing Provider  aspirin EC 325 MG tablet Take 325 mg by mouth at bedtime.   Yes Historical Provider, MD  fluticasone furoate-vilanterol (BREO ELLIPTA) 100-25 MCG/INH AEPB Inhale 1 puff into the lungs daily as needed.    Yes Historical Provider, MD  Insulin Lispro Prot & Lispro (HUMALOG MIX 75/25 KWIKPEN) (75-25) 100 UNIT/ML Kwikpen Inject 20 Units into the skin 2 (two) times daily.   Yes Historical Provider, MD  Liraglutide (VICTOZA) 18 MG/3ML SOPN Inject 1.2 mg into the skin every morning.   Yes Historical Provider, MD  lisinopril-hydrochlorothiazide (PRINZIDE,ZESTORETIC) 10-12.5 MG tablet Take 1 tablet by mouth at bedtime.   Yes Historical Provider, MD  metFORMIN (GLUCOPHAGE) 1000 MG tablet Take 1,000 mg by mouth at bedtime.   Yes Historical Provider, MD  omeprazole (PRILOSEC) 40 MG capsule Take 40 mg by mouth at bedtime. 01/02/14  Yes Historical Provider, MD  pravastatin (PRAVACHOL) 20 MG tablet Take 10 mg by mouth at bedtime.   Yes Historical Provider, MD    Physical Exam: Vitals:   09/04/16 2215 09/04/16 2230 09/04/16 2315 09/04/16 2330  BP: 175/69 172/55 163/73 (!) 149/54  Pulse: 66 67 68 65  Resp: 25 18 17 19   TempSrc:      SpO2: 99% 98% 98% 97%  Weight:      Height:          Constitutional: NAD, calm, comfortable Eyes: PERRL, lids and conjunctivae normal ENMT: Mucous membranes are moist. Posterior pharynx clear of any exudate or lesions.Normal dentition.  Neck: normal, supple, no  masses, no thyromegaly Respiratory: clear to auscultation bilaterally, no wheezing, no crackles. Normal respiratory effort. No accessory muscle use.  Cardiovascular: Regular rate and rhythm, no murmurs / rubs / gallops. No extremity edema. 2+ pedal pulses. No carotid bruits.  Abdomen: no tenderness, no masses palpated. No hepatosplenomegaly. Bowel sounds positive.  Musculoskeletal: no clubbing / cyanosis. No joint deformity upper and lower extremities. Good ROM, no contractures. Normal muscle tone.  Skin: no rashes, lesions, ulcers. No induration Neurologic: CN 2-12 grossly intact. Sensation intact, DTR normal. Strength 5/5 in all 4.  Psychiatric: Normal judgment and insight. Alert and oriented x 3. Normal mood.     Labs on Admission: I have personally reviewed following labs and imaging studies  CBC:  Recent Labs Lab 09/04/16 1626 09/04/16 1639  WBC 12.2*  --   NEUTROABS 6.4  --   HGB 12.9 14.3  HCT 41.3 42.0  MCV 85.2  --   PLT 320  --    Basic Metabolic Panel:  Recent Labs Lab 09/04/16 1626 09/04/16 1639  NA 138 141  K 4.4 4.3  CL 104 103  CO2 28  --   GLUCOSE 137* 134*  BUN 15 19  CREATININE 0.98 1.00  CALCIUM 9.6  --    GFR: Estimated Creatinine Clearance: 59.1 mL/min (by C-G formula based on SCr of 1 mg/dL). Liver Function Tests:  Recent Labs Lab 09/04/16 1626  AST 16  ALT 17  ALKPHOS 52  BILITOT 0.4  PROT 7.5  ALBUMIN 3.9   No results for input(s): LIPASE, AMYLASE in the last 168 hours. No results for input(s): AMMONIA in the last 168 hours. Coagulation Profile:  Recent Labs Lab 09/04/16 1626  INR 0.95   Cardiac Enzymes: No results for input(s): CKTOTAL, CKMB, CKMBINDEX, TROPONINI in the last 168 hours. BNP (last 3 results) No results for input(s): PROBNP in the last 8760 hours. HbA1C: No results for input(s): HGBA1C in the last 72 hours. CBG:  Recent Labs Lab 09/04/16 2200  GLUCAP 127*   Lipid Profile: No results for input(s): CHOL, HDL, LDLCALC, TRIG, CHOLHDL, LDLDIRECT in the last 72 hours. Thyroid Function Tests: No results for input(s): TSH, T4TOTAL, FREET4, T3FREE, THYROIDAB in the last 72 hours. Anemia Panel: No results for input(s): VITAMINB12, FOLATE, FERRITIN, TIBC, IRON, RETICCTPCT in the last 72 hours. Urine analysis: No results found for: COLORURINE, APPEARANCEUR, LABSPEC, PHURINE, GLUCOSEU, HGBUR, BILIRUBINUR, KETONESUR, PROTEINUR, UROBILINOGEN, NITRITE, LEUKOCYTESUR Sepsis Labs: @LABRCNTIP (procalcitonin:4,lacticidven:4) )No results found for this or any previous visit (from the past 240 hour(s)).   Radiological Exams on Admission: Ct Head Wo Contrast  Result Date: 09/04/2016 CLINICAL DATA:  Stroke symptoms.  Wobbly legs.  EXAM: CT HEAD WITHOUT CONTRAST TECHNIQUE: Contiguous axial images were obtained from the base of the skull through the vertex without intravenous contrast. COMPARISON:  11/29/2011 FINDINGS: Brain: There is a subcentimeter, ill-defined low-density in the posterior limb left internal capsule extending towards the thalamus. No evidence of cortical infarct. No hemorrhage, hydrocephalus, or mass lesion. Vascular: Atherosclerotic calcification. Skull: Normal. Negative for fracture or focal lesion. Sinuses/Orbits: Bilateral cataract resection Other: None. IMPRESSION: Age-indeterminate lacunar infarct in the left posterior internal capsule. Electronically Signed   By: Marnee Spring M.D.   On: 09/04/2016 16:48    EKG: Independently reviewed.  Assessment/Plan Principal Problem:   Acute ischemic stroke [facility]) Active Problems:   Right sided weakness   DM2 (diabetes mellitus, type 2) (HCC)   HTN (hypertension)    1. Acute ischemic stroke - 1. Stroke work up 2.  Due to pace maker cannot get MRI 3. CTA head and neck ordered but likely will need to pre-medicate due to iodine / shrimp allergy per radiology protocols 2. DM2 - 1. Holding home meds 2. NPH 12 BID 3. SSI AC/HS mod dose 3. HTN - 1. Continue home BP meds  DVT prophylaxis: Lovenox Code Status: Full Family Communication: No family in room Consults called: Dr. Roseanne Reno saw patient in ED Admission status: Admit to obs   Hillary Bow DO [facility] Pager [phone] from 7PM-7AM  If 7AM-7PM, please contact the day physician for the patient [email or web address] Password [email or web address]  09/05/2016, 12:02 AM

## 2016-09-05 NOTE — Progress Notes (Signed)
Patient's blood sugar was 411. RN put in order for STAT group. 15 units of insulin given as ordered. MD paged to notify.

## 2016-09-05 NOTE — Progress Notes (Signed)
Called lab to follow up with the random Glucose order. No response at this time. Will call back

## 2016-09-05 NOTE — Care Management Note (Signed)
Case Management Note  Patient Details  Name: Claire Mcgrath MRN: [id number] Date of Birth: 10-29-1946  Subjective/Objective:      Pt in to r/o CVA. She is from home.             Action/Plan: PT/OT recommending HH services. CM following for d/c needs.   Expected Discharge Date:                  Expected Discharge Plan:  Home w Home Health Services  In-House Referral:     Discharge planning Services     Post Acute Care Choice:    Choice offered to:     DME Arranged:    DME Agency:     HH Arranged:    HH Agency:     Status of Service:  In process, will continue to follow  If discussed at Long Length of Stay Meetings, dates discussed:    Additional Comments:  Kermit BaloKelli F Leray Garverick, RN 09/05/2016, 6:26 PM

## 2016-09-05 NOTE — Evaluation (Addendum)
Occupational Therapy Evaluation Patient Details Name: Claire SeatSylvia C Kochanski MRN: [id number] DOB: May 26, 1946 Today's Date: 09/05/2016    History of Present Illness 70 yo female admitted with R Le weakness. CT L posterior internal capsule infarct PMH:  Past Medical History:  Diagnosis Date  . Asthma   . CHF (congestive heart failure) (HCC)   . Diabetes mellitus without complication (HCC)   . Heart block   . Hyperlipidemia   . Hypertension       Clinical Impression   PT admitted with L posterior internal capsule infarct. Pt currently with functional limitiations due to the deficits listed below (see OT problem list). PTA was independent with adls.  Pt will benefit from skilled OT to increase their independence and safety with adls and balance to allow discharge outpatient .     Follow Up Recommendations  Home health OT    Equipment Recommendations  Other (comment) (TBA but likely need DME for showering)    Recommendations for Other Services       Precautions / Restrictions Precautions Precautions: Fall Restrictions Weight Bearing Restrictions: No      Mobility Bed Mobility Overal bed mobility: Needs Assistance Bed Mobility: Supine to Sit     Supine to sit: Supervision     General bed mobility comments: incr time to exit on L side. pt normally exits on L  Transfers Overall transfer level: Needs assistance   Transfers: Sit to/from Stand Sit to Stand: Min assist              Balance Overall balance assessment: Needs assistance Sitting-balance support: No upper extremity supported;Feet supported Sitting balance-Leahy Scale: Good     Standing balance support: During functional activity;No upper extremity supported Standing balance-Leahy Scale: Fair                              ADL Overall ADL's : Needs assistance/impaired Eating/Feeding: Set up;Sitting   Grooming: Set up;Standing                   Toilet Transfer: Minimal  assistance;Ambulation;Regular Teacher, adult educationToilet Toilet Transfer Details (indicate cue type and reason): pt with LOB entering bathroom. pt with widen base of support. pt holding onto rail and reports "i just dont trust that R Leg yet" Toileting- Clothing Manipulation and Hygiene: Set up;Sit to/from stand       Functional mobility during ADLs: Minimal assistance General ADL Comments: Pt currently with R LE deficits affecting balance. OT to address tub transfer next session due to fall risk with task     Vision     Perception     Praxis      Pertinent Vitals/Pain Pain Assessment: No/denies pain     Hand Dominance Right   Extremity/Trunk Assessment Upper Extremity Assessment Upper Extremity Assessment: Overall WFL for tasks assessed   Lower Extremity Assessment Lower Extremity Assessment: RLE deficits/detail;Defer to PT evaluation RLE Deficits / Details: guarding during transfers   Cervical / Trunk Assessment Cervical / Trunk Assessment: Normal   Communication Communication Communication: No difficulties   Cognition Arousal/Alertness: Awake/alert Behavior During Therapy: WFL for tasks assessed/performed Overall Cognitive Status: History of cognitive impairments - at baseline                     General Comments       Exercises       Shoulder Instructions      Home Living Family/patient expects to be  discharged to:: Private residence Living Arrangements: Spouse/significant other Available Help at Discharge: Family;Available 24 hours/day Type of Home: House Home Access: Stairs to enter [employer] of Steps: 2 (2 garage/ 2 in front door)   Home Layout: One level     Bathroom Shower/Tub: Tub/shower unit Shower/tub characteristics: Engineer, building services: Standard     Home Equipment: Environmental consultant - 2 wheels;Cane - single point   Additional Comments: participates in church   Lives With: Spouse    Prior Functioning/Environment Level of Independence:  Independent        Comments: drives        OT Problem List: Decreased strength;Decreased activity tolerance;Impaired balance (sitting and/or standing);Decreased cognition;Decreased safety awareness;Decreased knowledge of use of DME or AE;Decreased knowledge of precautions;Obesity   OT Treatment/Interventions: Self-care/ADL training;Therapeutic exercise;DME and/or AE instruction;Therapeutic activities;Cognitive remediation/compensation;Patient/family education;Balance training    OT Goals(Current goals can be found in the care plan section) Acute Rehab OT Goals Patient Stated Goal: none stated spouse asking if patient can leave today? - OT educated spouse on pending test and will not d/c until all testing is assessed OT Goal Formulation: With patient/family Time For Goal Achievement: 09/19/16 Potential to Achieve Goals: Good  OT Frequency: Min 2X/week   Barriers to D/C:            Co-evaluation              End of Session Equipment Utilized During Treatment: Gait belt Nurse Communication: Mobility status;Precautions  Activity Tolerance: Patient tolerated treatment well Patient left: in chair;with call bell/phone within reach;with family/visitor present   Time: 1043-1105 OT Time Calculation (min): 22 min Charges:  OT General Charges $OT Visit: 1 Procedure OT Evaluation $OT Eval Moderate Complexity: 1 Procedure G-Codes: OT G-codes **NOT FOR INPATIENT CLASS** Functional Assessment Tool Used: clinical judgement Functional Limitation: Self care Self Care Current Status (Z6109): At least 20 percent but less than 40 percent impaired, limited or restricted Self Care Goal Status (U0454): At least 1 percent but less than 20 percent impaired, limited or restricted  Claire Mcgrath 09/05/2016, 11:18 AM   Mateo Flow   OTR/L Pager: [phone] Office: [phone] .

## 2016-09-05 NOTE — Evaluation (Signed)
Speech Language Pathology Evaluation Patient Details Name: Claire Mcgrath MRN: [id number] DOB: 03/25/1946 Today's Date: 09/05/2016 Time: [id number] SLP Time Calculation (min) (ACUTE ONLY): 55 min  Problem List:  Patient Active Problem List   Diagnosis Date Noted  . Right sided weakness 09/04/2016  . Acute ischemic stroke (HCC) 09/04/2016  . DM2 (diabetes mellitus, type 2) (HCC) 09/04/2016  . HTN (hypertension) 09/04/2016   Past Medical History:  Past Medical History:  Diagnosis Date  . Asthma   . CHF (congestive heart failure) (HCC)   . Diabetes mellitus without complication (HCC)   . Heart block   . Hyperlipidemia   . Hypertension    Past Surgical History:  Past Surgical History:  Procedure Laterality Date  . ABDOMINAL HYSTERECTOMY    . BREAST LUMPECTOMY     left  . EP IMPLANTABLE DEVICE    . PACEMAKER INSERTION     HPI:  70 yo female [facility] with gait instability since Saturday and walking to the right and pt dropping items with right hand on Sunday.  Primary MD advised hospital admit.  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  Pt does all home duties including mowing yard/cooking.- she reports her husband is 13 years older than her and he was ill in March in hospital.     Assessment / Plan / Recommendation Clinical Impression  Pt presents with subtle dysarthria characterized by imprecise articulation at consonant cluster level however she is intelligible. MOCA administered and pt scored 24/30 with normal being 26.  Areas of challenges included memory, abstract thought and verbal fluency.  Word finding deficit reported by pt last night, pt was fluent during SLP session.  Delay in memory task was 10 minutes with frequent interruptions however.  She did recall 80% of information neurologist shared with her after his visit with cue for only one pending test.  As pt reports premorbid memory deficits, suspect this is baseline and no  follow up SLP indicated.    Educated pt to compensation strategies- verbally and in writiing using teach back for reinforcement. Spouse arrived toward end of evaluation and SLP encouraged spouse/pt to assure all home duties being managed/having someone occasionally check.  All education completed.  Thanks for letting me assist with this most pleasant patient.      SLP Assessment  Patient does not need any further Speech Lanaguage Pathology Services    Follow Up Recommendations  None    Frequency and Duration           SLP Evaluation Cognition  Overall Cognitive Status: History of cognitive impairments - at baseline (pt reports memory deficits at baseline stating her husband tell her she cant remember anything) Arousal/Alertness: Awake/alert Orientation Level: Oriented to person;Oriented to place;Oriented to time;Oriented to situation Attention: Sustained Sustained Attention: Appears intact Memory: Impaired Memory Impairment: Retrieval deficit;Storage deficit (pt recalled 2/5 words Independently, 2 with category cue, did not recall one, time lapse 10 minutes - not 5 minutes) Awareness: Appears intact (aware of changes) Problem Solving: Appears intact Safety/Judgment: Appears intact       Comprehension  Auditory Comprehension Overall Auditory Comprehension: Appears within functional limits for tasks assessed Yes/No Questions: Not tested Commands: Within Functional Limits Conversation: Complex Visual Recognition/Discrimination Discrimination: Within Function Limits Reading Comprehension Reading Status: Within funtional limits    Expression Expression Primary Mode of Expression: Verbal Verbal Expression Overall Verbal Expression: Appears within functional limits for tasks assessed Initiation: No impairment Level of Generative/Spontaneous Verbalization:  Conversation Repetition: No impairment Naming: No impairment Pragmatics: No impairment Written Expression Dominant Hand:  Right Written Expression: Within Functional Limits (for drawing and clock task)   Oral / Motor  Oral Motor/Sensory Function Overall Oral Motor/Sensory Function: Within functional limits Motor Speech Overall Motor Speech: Appears within functional limits for tasks assessed Respiration: Within functional limits Phonation: Normal Resonance: Within functional limits Articulation: Within functional limitis Intelligibility: Intelligible (slight dysarthria with consonant clusters) Motor Planning: Witnin functional limits   GO          Functional Limitations: Memory Memory Current Status (Z6109): At least 1 percent but less than 20 percent impaired, limited or restricted Memory Goal Status (U0454): At least 1 percent but less than 20 percent impaired, limited or restricted Memory Discharge Status [phone]): At least 1 percent but less than 20 percent impaired, limited or restricted         Mills Koller, MS [facility] SLP [phone]

## 2016-09-05 NOTE — Evaluation (Signed)
Physical Therapy Evaluation Patient Details Name: Claire Mcgrath MRN: [id number] DOB: 15-Aug-1946 Today's Date: 09/05/2016   History of Present Illness  70 yo female admitted with R Le weakness. CT L posterior internal capsule infarct PMH:  Clinical Impression  Pt presenting with R LE weakness and quick onset of fatigue making pt a increased falls risk as she required minA to maintain standing until chair could be brought to her. Pt with good home set up and support. Acute PT to follow.    Follow Up Recommendations Home health PT;Supervision/Assistance - 24 hour    Equipment Recommendations  Rolling walker with 5" wheels    Recommendations for Other Services       Precautions / Restrictions Precautions Precautions: Fall Restrictions Weight Bearing Restrictions: No      Mobility  Bed Mobility               General bed mobility comments: pt upin chair upon PT arrival  Transfers Overall transfer level: Needs assistance Equipment used: Rolling walker (2 wheeled) Transfers: Sit to/from Stand Sit to Stand: Min assist         General transfer comment: pt used hands appropriately and demo'd no knee buckling  Ambulation/Gait Ambulation/Gait assistance: Min assist Ambulation Distance (Feet): 50 Feet (x2) Assistive device: Rolling walker (2 wheeled) Gait Pattern/deviations: Step-to pattern;Decreased stride length Gait velocity: slow   General Gait Details: pt quickly became fatigues and R LE started to buckle on both trials requiring minA to assist to chair  Stairs            Wheelchair Mobility    Modified Rankin (Stroke Patients Only) Modified Rankin (Stroke Patients Only) Pre-Morbid Rankin Score: No symptoms Modified Rankin: Moderately severe disability     Balance Overall balance assessment: Needs assistance Sitting-balance support: No upper extremity supported Sitting balance-Leahy Scale: Good     Standing balance support: During functional  activity Standing balance-Leahy Scale: Poor Standing balance comment: needs RW for safe ambulation                             Pertinent Vitals/Pain Pain Assessment: No/denies pain    Home Living Family/patient expects to be discharged to:: Private residence Living Arrangements: Spouse/significant other Available Help at Discharge: Family;Available 24 hours/day Type of Home: House Home Access: Stairs to enter   [employer] of Steps: 2 (2 garage/ 2 in front door) Home Layout: One level Home Equipment: Walker - 2 wheels;Cane - single point Additional Comments: participates in church     Prior Function Level of Independence: Independent         Comments: drives     Hand Dominance   Dominant Hand: Right    Extremity/Trunk Assessment   Upper Extremity Assessment: Defer to OT evaluation           Lower Extremity Assessment: RLE deficits/detail RLE Deficits / Details: grossly 4/5, fatigues quickly    Cervical / Trunk Assessment: Normal  Communication   Communication: No difficulties  Cognition Arousal/Alertness: Awake/alert Behavior During Therapy: WFL for tasks assessed/performed Overall Cognitive Status: History of cognitive impairments - at baseline                      General Comments General comments (skin integrity, edema, etc.): pt became emotional stating "i wanted to better than this" pt given encouragement    Exercises     Assessment/Plan    PT Assessment Patient needs continued  PT services  PT Problem List Decreased strength;Decreased range of motion;Decreased activity tolerance;Decreased balance;Decreased mobility;Decreased knowledge of use of DME;Decreased safety awareness;Decreased coordination;Decreased cognition;Decreased knowledge of precautions          PT Treatment Interventions DME instruction;Gait training;Stair training;Functional mobility training;Therapeutic activities;Therapeutic exercise;Balance  training;Neuromuscular re-education    PT Goals (Current goals can be found in the Care Plan section)  Acute Rehab PT Goals Patient Stated Goal: home PT Goal Formulation: With patient Time For Goal Achievement: 09/12/16 Potential to Achieve Goals: Good    Frequency Min 4X/week   Barriers to discharge        Co-evaluation               End of Session Equipment Utilized During Treatment: Gait belt Activity Tolerance: Patient limited by fatigue Patient left: in chair;with call bell/phone within reach;with chair alarm set Nurse Communication: Mobility status    Functional Assessment Tool Used: clinical judgement Functional Limitation: Mobility: Walking and moving around Mobility: Walking and Moving Around Current Status [phone](G8978): At least 20 percent but less than 40 percent impaired, limited or restricted Mobility: Walking and Moving Around Goal Status [phone](G8979): At least 1 percent but less than 20 percent impaired, limited or restricted    Time: 1130-1153 PT Time Calculation (min) (ACUTE ONLY): 23 min   Charges:   PT Evaluation $PT Eval Moderate Complexity: 1 Procedure PT Treatments $Gait Training: 8-22 mins   PT G Codes:   PT G-Codes **NOT FOR INPATIENT CLASS** Functional Assessment Tool Used: clinical judgement Functional Limitation: Mobility: Walking and moving around Mobility: Walking and Moving Around Current Status (H0865(G8978): At least 20 percent but less than 40 percent impaired, limited or restricted Mobility: Walking and Moving Around Goal Status [phone](G8979): At least 1 percent but less than 20 percent impaired, limited or restricted    Marcene BrawnChadwell, Amariyon Maynes Marie 09/05/2016, 4:25 PM  Lewis ShockAshly Annaleise Burger, PT, DPT Pager #: [phone] Office #: [phone]

## 2016-09-05 NOTE — ED Notes (Signed)
Per CT pt need to take prednisone again at 0820 Pt also needs to take benadryl and prednisone 1 hr before CT scan with contrast. Orders will hopefully reflect

## 2016-09-05 NOTE — Progress Notes (Signed)
Pt admitted from ED with stroke diagnosis, pt alert and oriented, denies any pain at this time, pt settled in bed with call light at bedside, pt reassured, will however continue to monitor. Obasogie-Asidi, Dacian Orrico Efe

## 2016-09-05 NOTE — Progress Notes (Signed)
Subjective: Patient admitted this morning, see detailed H&P by Dr. Julian ReilGardner 70 y.o. female with medical history significant of DM, HTN, HLD, CHF, heart block with pacemaker.  Patient presents to the ED with c/o R sided weakness.  Lower extremity symptoms first noticed when she woke up on Sat.  Now with RUE weakness as well.  Symptoms are persistent.  No change in speech, no facial droop reported.  Patient continues to have weakness of right upper extremity  Vitals:   09/05/16 0826 09/05/16 1028  BP: (!) 151/65 (!) 158/67  Pulse: 75 71  Resp: 18 16  Temp:        A/P Acute ischemic stroke Diabetes mellitus Hypertension  MRI not ordered as patient has pacemaker, CTA head and neck ordered, premedicated due to iodine/shrimp allergy    Meredeth IdeGagan S Iran Rowe [facility] [phone]

## 2016-09-06 ENCOUNTER — Observation Stay (HOSPITAL_BASED_OUTPATIENT_CLINIC_OR_DEPARTMENT_OTHER): Payer: Medicare Other

## 2016-09-06 DIAGNOSIS — I639 Cerebral infarction, unspecified: Secondary | ICD-10-CM | POA: Diagnosis not present

## 2016-09-06 DIAGNOSIS — M6289 Other specified disorders of muscle: Secondary | ICD-10-CM | POA: Diagnosis not present

## 2016-09-06 DIAGNOSIS — I6789 Other cerebrovascular disease: Secondary | ICD-10-CM | POA: Diagnosis not present

## 2016-09-06 DIAGNOSIS — I10 Essential (primary) hypertension: Secondary | ICD-10-CM | POA: Diagnosis not present

## 2016-09-06 DIAGNOSIS — E1159 Type 2 diabetes mellitus with other circulatory complications: Secondary | ICD-10-CM | POA: Diagnosis not present

## 2016-09-06 LAB — ECHOCARDIOGRAM COMPLETE
Height: 64 in
WEIGHTICAEL: 3524.8 [oz_av]

## 2016-09-06 LAB — GLUCOSE, CAPILLARY
GLUCOSE-CAPILLARY: 125 mg/dL — AB (ref 65–99)
Glucose-Capillary: 321 mg/dL — ABNORMAL HIGH (ref 65–99)
Glucose-Capillary: 319 mg/dL — ABNORMAL HIGH (ref 65–99)

## 2016-09-06 MED ORDER — INSULIN NPH (HUMAN) (ISOPHANE) 100 UNIT/ML ~~LOC~~ SUSP
20.0000 [IU] | Freq: Two times a day (BID) | SUBCUTANEOUS | Status: DC
  Administered 2016-09-06: 20 [IU] via SUBCUTANEOUS

## 2016-09-06 MED ORDER — CLOPIDOGREL BISULFATE 75 MG PO TABS
75.0000 mg | ORAL_TABLET | Freq: Every day | ORAL | Status: DC
  Administered 2016-09-06: 75 mg via ORAL
  Filled 2016-09-06: qty 1

## 2016-09-06 MED ORDER — CLOPIDOGREL BISULFATE 75 MG PO TABS: 75.0000 mg | ORAL_TABLET | Freq: Every day | ORAL | 0 refills | Status: DC

## 2016-09-06 NOTE — Progress Notes (Signed)
Pt d/c to home. After visit care, presctriptions, and follow up visits reviewed with pt. Pt understood and confirmed learning with the teachback method. PIV removed without complication. Pt escorted off of unit to husband's car and care.

## 2016-09-06 NOTE — Progress Notes (Addendum)
STROKE TEAM PROGRESS NOTE   HISTORY OF PRESENT ILLNESS (per record) Claire Mcgrath is an 70 y.o. female history diabetes mellitus, hypertension, hyperlipidemia, asthma and CHF, presenting with weakness involving lower extremities, right greater than left as well as right upper extremity weakness. Lower extremity symptoms were first noticed 2 days ago. She noticed she had particular problems with her right lower extremity and to a lesser extent left upper extremity. One day prior to admission she noticed weakness of her right lower extremity with dropping objects as well as difficulty with writing. She had no change in speech. No facial droop is been reported. She has no previous history of stroke nor TIA. She takes aspirin daily. 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 She was LKW on 09/02/2016, time unknown. Patient was not administered IV t-PA secondary to  Minimal deficits; beyond time window for treatment consideration. She was admitted  for further evaluation and treatment.   SUBJECTIVE (INTERVAL HISTORY) Her RN is at the bedside, no family present.    Overall she feels her condition is stable.    OBJECTIVE Temp:  [97.7 F (36.5 C)-98.5 F (36.9 C)] 97.7 F (36.5 C) (09/20 1509) Pulse Rate:  [63-73] 63 (09/20 1509) Cardiac Rhythm: Ventricular paced (09/20 0700) Resp:  [18-20] 18 (09/20 1509) BP: (104-150)/(51-79) 126/57 (09/20 1509) SpO2:  [97 %-100 %] 98 % (09/20 1509)  CBC:   Recent Labs Lab 09/04/16 1626 09/04/16 1639  WBC 12.2*  --   NEUTROABS 6.4  --   HGB 12.9 14.3  HCT 41.3 42.0  MCV 85.2  --   PLT 320  --     Basic Metabolic Panel:   Recent Labs Lab 09/04/16 1626 09/04/16 1639 09/05/16 1420  NA 138 141  --   K 4.4 4.3  --   CL 104 103  --   CO2 28  --   --   GLUCOSE 137* 134* 451*  BUN 15 19  --   CREATININE 0.98 1.00  --   CALCIUM 9.6  --   --     Lipid Panel:     Component Value Date/Time   CHOL 146  09/05/2016 0405   TRIG 116 09/05/2016 0405   HDL 46 09/05/2016 0405   CHOLHDL 3.2 09/05/2016 0405   VLDL 23 09/05/2016 0405   LDLCALC 77 09/05/2016 0405   HgbA1c:  Lab Results  Component Value Date   HGBA1C 9.1 (H) 09/05/2016   Urine Drug Screen: No results found for: LABOPIA, COCAINSCRNUR, LABBENZ, AMPHETMU, THCU, LABBARB    IMAGING  Ct Angio Head W/cm &/or Wo Cm  Result Date: 09/05/2016 CLINICAL DATA:  70 year old female with bilateral lower extremity weakness for several days. Initial encounter. Age indeterminate lacunar infarct left thalamus/ posterior limb internal capsule on noncontrast head CT yesterday. EXAM: CT ANGIOGRAPHY HEAD AND NECK TECHNIQUE: Multidetector CT imaging of the head and neck was performed using the standard protocol during bolus administration of intravenous contrast. Multiplanar CT image reconstructions and MIPs were obtained to evaluate the vascular anatomy. Carotid stenosis measurements (when applicable) are obtained utilizing NASCET criteria, using the distal internal carotid diameter as the denominator. CONTRAST:  50 mL Isovue 370 COMPARISON:  Noncontrast head CT 09/04/2016 FINDINGS: CTA NECK Skeleton: No acute osseous abnormality identified. Visualized paranasal sinuses and mastoids are stable and well pneumatized. Upper chest: Left chest cardiac pacemaker type device. Mild to moderate perihilar peribronchial thickening. Mild bilateral paraseptal pulmonary emphysema. No superior mediastinal lymphadenopathy. Other  neck: Thyromegaly. There are 2 cm hypodense nodules in both the left lobe posteriorly and at the junction of the left lobe and isthmus. Negative larynx, pharynx, parapharyngeal spaces, retropharyngeal space, sublingual space, submandibular glands and parotid glands. No cervical lymphadenopathy. Aortic arch: 3 vessel arch configuration. Moderate Calcified aortic atherosclerosis. Right carotid system: No brachiocephalic or right CCA origin stenosis. Mildly  tortuous proximal right CCA. Soft plaque at the right carotid bifurcation and ICA origin without stenosis. Left carotid system: No left CCA origin stenosis despite soft and calcified plaque. Mildly tortuous proximal left CCA. Soft and calcified plaque at the left carotid bifurcation but no proximal left ICA stenosis results. Mildly tortuous cervical left ICA. Vertebral arteries: No proximal right subclavian artery stenosis. Minimal soft plaque at the right vertebral artery origin without stenosis. Negative right vertebral artery to the skullbase. No proximal left subclavian artery stenosis despite soft and calcified plaque. Normal left vertebral artery origin. Negative cervical left vertebral artery. CTA HEAD Posterior circulation: Normal PICA origins, the left is somewhat early. No distal vertebral stenosis. Patent vertebrobasilar junction. Mild basilar artery irregularity. No basilar stenosis. Normal SCA origins. Mild irregularity and stenosis at both PCA origins an the left P1 segment. Moderate irregularity and stenosis in the left P2 segment (series 407, image 48 and series 405, image 22. This could be implicated in the left thalamostriate ischemia. Posterior communicating arteries are diminutive or absent. Distal PCA branches are within normal limits. Anterior circulation: Both ICA siphons are patent, but there is up to moderate calcified siphon plaque bilaterally. Still no hemodynamically significant siphon stenosis is identified. Normal ophthalmic artery origins. Normal MCA and ACA origins. Tortuous A1 segments. Anterior communicating artery and bilateral ACA branches are within normal limits. Left MCA M1 segment, bifurcation and left MCA branches are within normal limits. Right MCA M1 segment, early bifurcation, and right MCA branches are within normal limits. Venous sinuses: Patent. Anatomic variants: None. Delayed phase: Stable hypodensity at the left lateral thalamus near the posterior limb of the left  internal capsule. Stable gray-white matter differentiation elsewhere. No cortically based acute infarct identified. No abnormal enhancement identified. IMPRESSION: 1. Negative for emergent large vessel occlusion. 2. Moderate left PCA P2 segment irregularity and stenosis. This could reflect atherosclerosis or thromboembolic disease and might be associated with the age indeterminate left thalamostriate artery territory lacunar infarct described yesterday. 3. Bilateral carotid bifurcation mostly soft plaque, and ICA siphon calcified plaque, without hemodynamically significant stenosis. 4. Minimal to mild vertebral and basilar artery atherosclerosis without stenosis. 5.  Calcified aortic atherosclerosis. 6. Thyromegaly with 2 thyroid nodules which meet consensus criteria for routine follow-up Thyroid Ultrasound. 7. Pulmonary peribronchial thickening and paraseptal emphysema. Electronically Signed   By: Odessa Fleming M.D.   On: 09/05/2016 15:25   Dg Chest 2 View  Result Date: 09/05/2016 CLINICAL DATA:  Acute onset of left-sided weakness. Initial encounter. EXAM: CHEST  2 VIEW COMPARISON:  Chest radiograph from 02/03/2015 FINDINGS: The lungs are well-aerated. Mild bibasilar opacities likely reflect atelectasis or scarring. There is no evidence of pleural effusion or pneumothorax. The heart is borderline normal in size. A pacemaker is noted at the left chest wall, with leads ending at the right atrium and right ventricle. No acute osseous abnormalities are seen. IMPRESSION: Mild bibasilar airspace opacities likely reflect atelectasis or scarring. Lungs otherwise grossly clear. Electronically Signed   By: Roanna Raider M.D.   On: 09/05/2016 00:29   Ct Head Wo Contrast  Result Date: 09/04/2016 CLINICAL DATA:  Stroke symptoms.  Wobbly legs. EXAM: CT HEAD WITHOUT CONTRAST TECHNIQUE: Contiguous axial images were obtained from the base of the skull through the vertex without intravenous contrast. COMPARISON:  11/29/2011  FINDINGS: Brain: There is a subcentimeter, ill-defined low-density in the posterior limb left internal capsule extending towards the thalamus. No evidence of cortical infarct. No hemorrhage, hydrocephalus, or mass lesion. Vascular: Atherosclerotic calcification. Skull: Normal. Negative for fracture or focal lesion. Sinuses/Orbits: Bilateral cataract resection Other: None. IMPRESSION: Age-indeterminate lacunar infarct in the left posterior internal capsule. Electronically Signed   By: Marnee Spring M.D.   On: 09/04/2016 16:48   Ct Angio Neck W/cm &/or Wo/cm  Result Date: 09/05/2016 CLINICAL DATA:  70 year old female with bilateral lower extremity weakness for several days. Initial encounter. Age indeterminate lacunar infarct left thalamus/ posterior limb internal capsule on noncontrast head CT yesterday. EXAM: CT ANGIOGRAPHY HEAD AND NECK TECHNIQUE: Multidetector CT imaging of the head and neck was performed using the standard protocol during bolus administration of intravenous contrast. Multiplanar CT image reconstructions and MIPs were obtained to evaluate the vascular anatomy. Carotid stenosis measurements (when applicable) are obtained utilizing NASCET criteria, using the distal internal carotid diameter as the denominator. CONTRAST:  50 mL Isovue 370 COMPARISON:  Noncontrast head CT 09/04/2016 FINDINGS: CTA NECK Skeleton: No acute osseous abnormality identified. Visualized paranasal sinuses and mastoids are stable and well pneumatized. Upper chest: Left chest cardiac pacemaker type device. Mild to moderate perihilar peribronchial thickening. Mild bilateral paraseptal pulmonary emphysema. No superior mediastinal lymphadenopathy. Other neck: Thyromegaly. There are 2 cm hypodense nodules in both the left lobe posteriorly and at the junction of the left lobe and isthmus. Negative larynx, pharynx, parapharyngeal spaces, retropharyngeal space, sublingual space, submandibular glands and parotid glands. No  cervical lymphadenopathy. Aortic arch: 3 vessel arch configuration. Moderate Calcified aortic atherosclerosis. Right carotid system: No brachiocephalic or right CCA origin stenosis. Mildly tortuous proximal right CCA. Soft plaque at the right carotid bifurcation and ICA origin without stenosis. Left carotid system: No left CCA origin stenosis despite soft and calcified plaque. Mildly tortuous proximal left CCA. Soft and calcified plaque at the left carotid bifurcation but no proximal left ICA stenosis results. Mildly tortuous cervical left ICA. Vertebral arteries: No proximal right subclavian artery stenosis. Minimal soft plaque at the right vertebral artery origin without stenosis. Negative right vertebral artery to the skullbase. No proximal left subclavian artery stenosis despite soft and calcified plaque. Normal left vertebral artery origin. Negative cervical left vertebral artery. CTA HEAD Posterior circulation: Normal PICA origins, the left is somewhat early. No distal vertebral stenosis. Patent vertebrobasilar junction. Mild basilar artery irregularity. No basilar stenosis. Normal SCA origins. Mild irregularity and stenosis at both PCA origins an the left P1 segment. Moderate irregularity and stenosis in the left P2 segment (series 407, image 48 and series 405, image 22. This could be implicated in the left thalamostriate ischemia. Posterior communicating arteries are diminutive or absent. Distal PCA branches are within normal limits. Anterior circulation: Both ICA siphons are patent, but there is up to moderate calcified siphon plaque bilaterally. Still no hemodynamically significant siphon stenosis is identified. Normal ophthalmic artery origins. Normal MCA and ACA origins. Tortuous A1 segments. Anterior communicating artery and bilateral ACA branches are within normal limits. Left MCA M1 segment, bifurcation and left MCA branches are within normal limits. Right MCA M1 segment, early bifurcation, and right  MCA branches are within normal limits. Venous sinuses: Patent. Anatomic variants: None. Delayed phase: Stable hypodensity at the left lateral thalamus near the posterior limb of  the left internal capsule. Stable gray-white matter differentiation elsewhere. No cortically based acute infarct identified. No abnormal enhancement identified. IMPRESSION: 1. Negative for emergent large vessel occlusion. 2. Moderate left PCA P2 segment irregularity and stenosis. This could reflect atherosclerosis or thromboembolic disease and might be associated with the age indeterminate left thalamostriate artery territory lacunar infarct described yesterday. 3. Bilateral carotid bifurcation mostly soft plaque, and ICA siphon calcified plaque, without hemodynamically significant stenosis. 4. Minimal to mild vertebral and basilar artery atherosclerosis without stenosis. 5.  Calcified aortic atherosclerosis. 6. Thyromegaly with 2 thyroid nodules which meet consensus criteria for routine follow-up Thyroid Ultrasound. 7. Pulmonary peribronchial thickening and paraseptal emphysema. Electronically Signed   By: Odessa Fleming M.D.   On: 09/05/2016 15:25     PHYSICAL EXAM Pleasant elderly african american lady not in distress. . Afebrile. Head is nontraumatic. Neck is supple without bruit.    Cardiac exam no murmur or gallop. Lungs are clear to auscultation. Distal pulses are well felt. Neurological Exam :  Awake alert normal speech and language function. Pupils equal reactive. Fundi were not visualized. Vision acuity seems adequate. Blinks to threat bilaterally. Mild right lower facial asymmetry on smiling. Tongue midline. No upper extremity drift but mild weakness of right grip and intrinsic hand muscles. Mild bilateral proximal leg weakness. Symmetric distal strength. Sensation is intact bilaterally. Deep tendon reflexes are symmetric. Plantars downgoing. Gait was not tested. ASSESSMENT/PLAN Claire Mcgrath is a 70 y.o. female with  history of diabetes mellitus, hypertension, hyperlipidemia, asthma and CHF  presenting with new onset R sided weakness. She did not receive IV t-PA due to minimal deficits; beyond time window for treatment consideration.   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 Patient has been premedicated due to dye allergy  2D Echo  pending   LDL 77  HgbA1c 9.1  Lovenox 40 mg sq daily for VTE prophylaxis Diet Carb Modified Fluid consistency: Thin; Room service appropriate? Yes  aspirin 81 mg daily prior to admission, now on aspirin 325 mg daily. Consider change to plavix   Patient counseled to be compliant with her antithrombotic medications  Ongoing aggressive stroke risk factor management  Therapy recommendations:  pending   Disposition:  pending  (lives with husband)  Hypertension  Stable  Permissive hypertension (OK if < 220/120) but gradually normalize in 5-7 days  Long-term BP goal normotensive  Hyperlipidemia  Home meds:  pravachol 20, resumed in hospital  LDL 77, goal < 70  Continue statin at discharge  Diabetes type II  HgbA1c pending, goal < 7.0  Other Stroke Risk Factors  Advanced age  Cigarette smoker, advised to stop smoking  ETOH use, advised to drink no more than 1 drink(s) a day  Obesity, Body mass index is 37.81 kg/m., recommend weight loss, diet and exercise as appropriate   Family hx stroke (maternal aunt)  CHF  Other Active Problems  Patient reports age related cognitive decline.   Hospital day # 0  Bexlee Bergdoll  [facility] See Amion for Pager information 09/06/2016 3:24 PM  I have personally examined this patient, reviewed notes, independently viewed imaging studies, participated in medical decision making and plan of care.ROS completed by me personally and pertinent positives fully  documented  I have made any additions or clarifications directly to the above note. Agree with note above.She presented with  2 day h/o bilateral leg weakness with right hand weakness likely due to a small left brain subcortical infarct. CT scan shows age-indeterminate lacuna in the left thalamus and MRI cannot be obtained due to pacemaker. She remains at risk for neurological worsening, recurrent stroke, TIA needs ongoing stroke evaluation. Greater than 50% time during this 15 minute visit was spent on coordination of care and counseling about stroke risk, prevention and treatment. Discussed with patient and  answered questions.Stroke team will sign off. Call for questions.  Delia Heady, MD Medical Director [facility] Pager: [phone] 09/06/2016 3:24 PM    To contact Stroke Continuity provider, please refer to [email or web address]. After hours, contact General Neurology

## 2016-09-06 NOTE — Care Management Note (Signed)
Case Management Note  Patient Details  Name: Claire Mcgrath MRN: [id number] Date of Birth: [birth year]  Subjective/Objective:                    Action/Plan: Plan is for patient to discharge home with self care. CM consulted to arrange outpatient therapy. [place] in [place] but would [facility]. Orders placed in EPIC and on AVS.   Expected Discharge Date:                  Expected Discharge Plan:  Home w Home Health Services  In-House Referral:     Discharge planning Services  CM Consult  Post Acute Care Choice:    Choice offered to:     DME Arranged:    DME Agency:     HH Arranged:    HH Agency:     Status of Service:  Completed, signed off  If discussed at [employer] Length of Stay Meetings, dates discussed:    Additional Comments:  Kermit BaloKelli F Genasis Zingale, RN 09/06/2016, 4:32 PM

## 2016-09-06 NOTE — Progress Notes (Signed)
Physical Therapy Treatment Patient Details Name: Claire Mcgrath MRN: [id number] DOB: 06/30/1946 Today's Date: 09/06/2016    History of Present Illness 70 yo female admitted with R Le weakness. CT L posterior internal capsule infarct PMH:    PT Comments    Pt with improved ambulation endurance but con't to demo R LE weakness as demo'd during stair negotiation when R knee buckled. Pt requires bilat HRs to complete stair negotiation safely. HEP given. Acute PT to follow.  Follow Up Recommendations  Outpatient PT;Supervision/Assistance - 24 hour     Equipment Recommendations  Rolling walker with 5" wheels    Recommendations for Other Services       Precautions / Restrictions Precautions Precautions: Fall Restrictions Weight Bearing Restrictions: No    Mobility  Bed Mobility Overal bed mobility: Needs Assistance Bed Mobility: Supine to Sit     Supine to sit: Supervision     General bed mobility comments: hob elevated, use of bed rail, increased time  Transfers Overall transfer level: Needs assistance Equipment used: Rolling walker (2 wheeled) Transfers: Sit to/from Stand Sit to Stand: Min assist         General transfer comment: pt used hands appropriately and demo'd no knee buckling  Ambulation/Gait Ambulation/Gait assistance: Min guard Ambulation Distance (Feet): 80 Feet (x2) Assistive device: Rolling walker (2 wheeled) Gait Pattern/deviations: Step-through pattern;Decreased stride length Gait velocity: slow   General Gait Details: pt with improved gait pattern however and ambulation tolerance but is dependent on UE on RW   Stairs Stairs: Yes Stairs assistance: Min assist Stair Management: Two rails;Step to pattern Number of Stairs: 2 General stair comments: attempted to R HHA and no hand rail to mimic stairs at home however pt's R knee buckled requiring maxA to regain balance and prevent hitting the floor. pt reached for handrail as well. Pt reports  having 2 steps in front she can use because there are 2 handrails, pt able to complete 2 steps with mina  Wheelchair Mobility    Modified Rankin (Stroke Patients Only) Modified Rankin (Stroke Patients Only) Pre-Morbid Rankin Score: No symptoms Modified Rankin: Moderate disability     Balance Overall balance assessment: Needs assistance Sitting-balance support: No upper extremity supported;Feet supported Sitting balance-Leahy Scale: Good                              Cognition Arousal/Alertness: Awake/alert Behavior During Therapy: WFL for tasks assessed/performed Overall Cognitive Status: History of cognitive impairments - at baseline       Memory: Decreased short-term memory              Exercises General Exercises - Lower Extremity Ankle Circles/Pumps: AROM;Both;20 reps;Seated Long Arc Quad: AROM;Both;20 reps;Seated (with 5 sec hold) Hip Flexion/Marching: PROM;Both;20 reps;Seated    General Comments        Pertinent Vitals/Pain Pain Assessment: No/denies pain    Home Living                      Prior Function            PT Goals (current goals can now be found in the care plan section) Acute Rehab PT Goals Patient Stated Goal: home Progress towards PT goals: Progressing toward goals    Frequency    Min 4X/week      PT Plan Discharge plan needs to be updated    Co-evaluation  End of Session Equipment Utilized During Treatment: Gait belt Activity Tolerance: Patient tolerated treatment well Patient left: in chair;with call bell/phone within reach;with chair alarm set     Time: 1610-9604 PT Time Calculation (min) (ACUTE ONLY): 31 min  Charges:  $Gait Training: 8-22 mins $Therapeutic Exercise: 8-22 mins                    G Codes:      Marcene Brawn 09/06/2016, 2:04 PM   Lewis Shock, PT, DPT Pager #: [phone] Office #: [phone]

## 2016-09-06 NOTE — Progress Notes (Signed)
Inpatient Diabetes Program Recommendations  AACE/ADA: New Consensus Statement on Inpatient Glycemic Control (2015)  Target Ranges:  Prepandial:   less than 140 mg/dL      Peak postprandial:   less than 180 mg/dL (1-2 hours)      Critically ill patients:  140 - 180 mg/dL   Lab Results  Component Value Date   GLUCAP 321 (H) 09/06/2016   HGBA1C 9.1 (H) 09/05/2016    Review of Glycemic Control:  Results for Fletcher AnonRIPPY, Zenobia C (MRN [id number]) as of 09/06/2016 11:05  Ref. Range 09/05/2016 07:17 09/05/2016 11:36 09/05/2016 16:15 09/05/2016 21:43 09/06/2016 06:39  Glucose-Capillary Latest Ref Range: 65 - 99 mg/dL 147319 (H) 829411 (H) 562329 (H) 436 (H) 321 (H)    Diabetes history: Type 2 diabetes Outpatient Diabetes medications: Humalog 75/25 20 units bid, Victoza 1.2 mg daily Current orders for Inpatient glycemic control:  Novolog resistant tid with meals, NPH 20 units bid  Inpatient Diabetes Program Recommendations:    Note that blood sugars elevated.  Consider d/c of NPH and add Novolog 70/30 25 units bid.    Thanks, Beryl MeagerJenny Mel Tadros, RN, BC-ADM Inpatient Diabetes Coordinator Pager [phone] (8a-5p)

## 2016-09-06 NOTE — Discharge Summary (Signed)
Physician Discharge Summary  Claire Mcgrath ZOX:[id number] DOB: [birth year] DOA: 09/04/2016  PCP: PROVIDER NOT IN SYSTEM  Admit date: 09/04/2016 Discharge date: 09/06/2016  Admitted From: Home Disposition: Home    Recommendations for Outpatient Follow-up:  1. Follow up with PCP in 1-2 weeks: HbA1c 9.1% and pt is aware that control of diabetes is important for secondary stroke prevention. 2. Please obtain thyroid function tests and follow up imaging for 2 incidental thyroid nodules found on imaging (see below) 3. Consider augmenting statin therapy (LDL 77 on pravastatin), no changes made. 4. Monitor for bleeding. Aspirin was stopped and replaced by plavix.  Home Health: Declined by pt's family Equipment/Devices: Rolling walker with 5" wheels   Discharge Condition: stable CODE STATUS: Full Diet recommendation: Heart healthy, carbohydrate modified  Brief/Interim Summary: Claire C Rippyis an 70 y.o.femalehistory diabetes mellitus, hypertension, hyperlipidemia, asthma and CHF, presenting with weakness involving lower extremities, right greater than left as well as right upper extremity weakness. Lower extremity symptoms were first noticed 2 days ago. She noticed she had particular problems with her right lower extremity and to a lesser extent left upper extremity. One day prior to admission she noticed weakness of her right lower extremity with dropping objects as well as difficulty with writing. She had no change in speech. No facial droop is been reported. She has no previous history of stroke nor TIA. She takes aspirin daily. 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 She was LKW on 09/02/2016, time unknown. Patient was not administered IV t-PA secondary to  Minimal deficits; beyond time window for treatment consideration. She was admitted  for further evaluation and treatment.  MRI/MRA was contraindicated due to pacemaker. CTA head and neck  showed no large vessel stenosis or occlusion. Echo showed no cardioembolic source.  Discharge Diagnoses:  Principal Problem:   Acute ischemic stroke [facility]) Active Problems:   Right sided weakness   DM2 (diabetes mellitus, type 2) (HCC)   HTN (hypertension)   Asthma   Hyperlipemia   CHF (congestive heart failure) (HCC)   Obesity  Discharge Instructions Discharge Instructions    Ambulatory referral to Neurology    Complete by:  As directed    An appointment is requested in approximately: 8 weeks   Ambulatory referral to Physical Therapy    Complete by:  As directed    Diet - low sodium heart healthy    Complete by:  As directed    Discharge instructions    Complete by:  As directed    You were admitted for right sided weakness. You need to continue therapy as an outpatient. This will be coordinated with you after discharge.  - You need to keep close follow up with your primary care provider to manage your diabetes (Your A1c was 9.1%!) because uncontrolled diabetes predisposes you to a stroke.  - You will need to follow up for blood pressure control as well - START taking PLAVIX every day, this replaces aspirin - STOP taking aspirin  There were 2 thyroid nodules incidentally found on the imaging of your neck. The significance of this is unknown, and the recommendation is to monitor these as an outpatient. It is very important that you make sure that your PCP is aware of these nodules and you continue the work up.   Increase activity slowly    Complete by:  As directed        Medication List    STOP taking these medications  aspirin EC 325 MG tablet     TAKE these medications   clopidogrel 75 MG tablet Commonly known as:  PLAVIX Take 1 tablet (75 mg total) by mouth daily. Start taking on:  09/07/2016   fluticasone furoate-vilanterol 100-25 MCG/INH Aepb Commonly known as:  BREO ELLIPTA Inhale 1 puff into the lungs daily as needed.   HUMALOG MIX 75/25 KWIKPEN (75-25) 100  UNIT/ML Kwikpen Generic drug:  Insulin Lispro Prot & Lispro Inject 20 Units into the skin 2 (two) times daily.   lisinopril-hydrochlorothiazide 10-12.5 MG tablet Commonly known as:  PRINZIDE,ZESTORETIC Take 1 tablet by mouth at bedtime.   metFORMIN 1000 MG tablet Commonly known as:  GLUCOPHAGE Take 1,000 mg by mouth at bedtime.   omeprazole 40 MG capsule Commonly known as:  PRILOSEC Take 40 mg by mouth at bedtime.   pravastatin 20 MG tablet Commonly known as:  PRAVACHOL Take 10 mg by mouth at bedtime.   VICTOZA 18 MG/3ML Sopn Generic drug:  Liraglutide Inject 1.2 mg into the skin every morning.      Follow-up Information    SETHI,PRAMOD, MD Follow up in 2 month(s).   Specialties:  Neurology, Radiology Why:  You will be contacted to schedule appointment Contact information: [address] [place] [address] [phone]        Outpt Rehabilitation [facility] .   Specialty:  Rehabilitation Why:  they will contact you for the first visit Contact information: [address] [id number] mc [place] [address] [phone]         Allergies  Allergen Reactions  . Kiwi Extract Anaphylaxis    Feel my throat close up  . Iodine Itching and Swelling       . Shrimp [Shellfish Allergy] Itching and Swelling    Consultations:  Stroke team, Dr. Pearlean Brownie  Procedures/Studies: Ct Angio Head W/cm &/or Wo Cm  Result Date: 09/05/2016 CLINICAL DATA:  70 year old female with bilateral lower extremity weakness for several days. Initial encounter. Age indeterminate lacunar infarct left thalamus/ posterior limb internal capsule on noncontrast head CT yesterday. EXAM: CT ANGIOGRAPHY HEAD AND NECK TECHNIQUE: Multidetector CT imaging of the head and neck was performed using the standard protocol during bolus administration of intravenous contrast. Multiplanar CT image reconstructions and MIPs were obtained to evaluate the  vascular anatomy. Carotid stenosis measurements (when applicable) are obtained utilizing NASCET criteria, using the distal internal carotid diameter as the denominator. CONTRAST:  50 mL Isovue 370 COMPARISON:  Noncontrast head CT 09/04/2016 FINDINGS: CTA NECK Skeleton: No acute osseous abnormality identified. Visualized paranasal sinuses and mastoids are stable and well pneumatized. Upper chest: Left chest cardiac pacemaker type device. Mild to moderate perihilar peribronchial thickening. Mild bilateral paraseptal pulmonary emphysema. No superior mediastinal lymphadenopathy. Other neck: Thyromegaly. There are 2 cm hypodense nodules in both the left lobe posteriorly and at the junction of the left lobe and isthmus. Negative larynx, pharynx, parapharyngeal spaces, retropharyngeal space, sublingual space, submandibular glands and parotid glands. No cervical lymphadenopathy. Aortic arch: 3 vessel arch configuration. Moderate Calcified aortic atherosclerosis. Right carotid system: No brachiocephalic or right CCA origin stenosis. Mildly tortuous proximal right CCA. Soft plaque at the right carotid bifurcation and ICA origin without stenosis. Left carotid system: No left CCA origin stenosis despite soft and calcified plaque. Mildly tortuous proximal left CCA. Soft and calcified plaque at the left carotid bifurcation but no proximal left ICA stenosis results. Mildly tortuous cervical left ICA. Vertebral arteries: No proximal right subclavian artery stenosis. Minimal soft plaque  at the right vertebral artery origin without stenosis. Negative right vertebral artery to the skullbase. No proximal left subclavian artery stenosis despite soft and calcified plaque. Normal left vertebral artery origin. Negative cervical left vertebral artery. CTA HEAD Posterior circulation: Normal PICA origins, the left is somewhat early. No distal vertebral stenosis. Patent vertebrobasilar junction. Mild basilar artery irregularity. No basilar  stenosis. Normal SCA origins. Mild irregularity and stenosis at both PCA origins an the left P1 segment. Moderate irregularity and stenosis in the left P2 segment (series 407, image 48 and series 405, image 22. This could be implicated in the left thalamostriate ischemia. Posterior communicating arteries are diminutive or absent. Distal PCA branches are within normal limits. Anterior circulation: Both ICA siphons are patent, but there is up to moderate calcified siphon plaque bilaterally. Still no hemodynamically significant siphon stenosis is identified. Normal ophthalmic artery origins. Normal MCA and ACA origins. Tortuous A1 segments. Anterior communicating artery and bilateral ACA branches are within normal limits. Left MCA M1 segment, bifurcation and left MCA branches are within normal limits. Right MCA M1 segment, early bifurcation, and right MCA branches are within normal limits. Venous sinuses: Patent. Anatomic variants: None. Delayed phase: Stable hypodensity at the left lateral thalamus near the posterior limb of the left internal capsule. Stable gray-white matter differentiation elsewhere. No cortically based acute infarct identified. No abnormal enhancement identified. IMPRESSION: 1. Negative for emergent large vessel occlusion. 2. Moderate left PCA P2 segment irregularity and stenosis. This could reflect atherosclerosis or thromboembolic disease and might be associated with the age indeterminate left thalamostriate artery territory lacunar infarct described yesterday. 3. Bilateral carotid bifurcation mostly soft plaque, and ICA siphon calcified plaque, without hemodynamically significant stenosis. 4. Minimal to mild vertebral and basilar artery atherosclerosis without stenosis. 5.  Calcified aortic atherosclerosis. 6. Thyromegaly with 2 thyroid nodules which meet consensus criteria for routine follow-up Thyroid Ultrasound. 7. Pulmonary peribronchial thickening and paraseptal emphysema. Electronically  Signed   By: Odessa Fleming M.D.   On: 09/05/2016 15:25   Dg Chest 2 View  Result Date: 09/05/2016 CLINICAL DATA:  Acute onset of left-sided weakness. Initial encounter. EXAM: CHEST  2 VIEW COMPARISON:  Chest radiograph from 02/03/2015 FINDINGS: The lungs are well-aerated. Mild bibasilar opacities likely reflect atelectasis or scarring. There is no evidence of pleural effusion or pneumothorax. The heart is borderline normal in size. A pacemaker is noted at the left chest wall, with leads ending at the right atrium and right ventricle. No acute osseous abnormalities are seen. IMPRESSION: Mild bibasilar airspace opacities likely reflect atelectasis or scarring. Lungs otherwise grossly clear. Electronically Signed   By: Roanna Raider M.D.   On: 09/05/2016 00:29   Ct Head Wo Contrast  Result Date: 09/04/2016 CLINICAL DATA:  Stroke symptoms.  Wobbly legs. EXAM: CT HEAD WITHOUT CONTRAST TECHNIQUE: Contiguous axial images were obtained from the base of the skull through the vertex without intravenous contrast. COMPARISON:  11/29/2011 FINDINGS: Brain: There is a subcentimeter, ill-defined low-density in the posterior limb left internal capsule extending towards the thalamus. No evidence of cortical infarct. No hemorrhage, hydrocephalus, or mass lesion. Vascular: Atherosclerotic calcification. Skull: Normal. Negative for fracture or focal lesion. Sinuses/Orbits: Bilateral cataract resection Other: None. IMPRESSION: Age-indeterminate lacunar infarct in the left posterior internal capsule. Electronically Signed   By: Marnee Spring M.D.   On: 09/04/2016 16:48   Ct Angio Neck W/cm &/or Wo/cm  Result Date: 09/05/2016 CLINICAL DATA:  70 year old female with bilateral lower extremity weakness for several days. Initial encounter. Age indeterminate  lacunar infarct left thalamus/ posterior limb internal capsule on noncontrast head CT yesterday. EXAM: CT ANGIOGRAPHY HEAD AND NECK TECHNIQUE: Multidetector CT imaging of the head  and neck was performed using the standard protocol during bolus administration of intravenous contrast. Multiplanar CT image reconstructions and MIPs were obtained to evaluate the vascular anatomy. Carotid stenosis measurements (when applicable) are obtained utilizing NASCET criteria, using the distal internal carotid diameter as the denominator. CONTRAST:  50 mL Isovue 370 COMPARISON:  Noncontrast head CT 09/04/2016 FINDINGS: CTA NECK Skeleton: No acute osseous abnormality identified. Visualized paranasal sinuses and mastoids are stable and well pneumatized. Upper chest: Left chest cardiac pacemaker type device. Mild to moderate perihilar peribronchial thickening. Mild bilateral paraseptal pulmonary emphysema. No superior mediastinal lymphadenopathy. Other neck: Thyromegaly. There are 2 cm hypodense nodules in both the left lobe posteriorly and at the junction of the left lobe and isthmus. Negative larynx, pharynx, parapharyngeal spaces, retropharyngeal space, sublingual space, submandibular glands and parotid glands. No cervical lymphadenopathy. Aortic arch: 3 vessel arch configuration. Moderate Calcified aortic atherosclerosis. Right carotid system: No brachiocephalic or right CCA origin stenosis. Mildly tortuous proximal right CCA. Soft plaque at the right carotid bifurcation and ICA origin without stenosis. Left carotid system: No left CCA origin stenosis despite soft and calcified plaque. Mildly tortuous proximal left CCA. Soft and calcified plaque at the left carotid bifurcation but no proximal left ICA stenosis results. Mildly tortuous cervical left ICA. Vertebral arteries: No proximal right subclavian artery stenosis. Minimal soft plaque at the right vertebral artery origin without stenosis. Negative right vertebral artery to the skullbase. No proximal left subclavian artery stenosis despite soft and calcified plaque. Normal left vertebral artery origin. Negative cervical left vertebral artery. CTA HEAD  Posterior circulation: Normal PICA origins, the left is somewhat early. No distal vertebral stenosis. Patent vertebrobasilar junction. Mild basilar artery irregularity. No basilar stenosis. Normal SCA origins. Mild irregularity and stenosis at both PCA origins an the left P1 segment. Moderate irregularity and stenosis in the left P2 segment (series 407, image 48 and series 405, image 22. This could be implicated in the left thalamostriate ischemia. Posterior communicating arteries are diminutive or absent. Distal PCA branches are within normal limits. Anterior circulation: Both ICA siphons are patent, but there is up to moderate calcified siphon plaque bilaterally. Still no hemodynamically significant siphon stenosis is identified. Normal ophthalmic artery origins. Normal MCA and ACA origins. Tortuous A1 segments. Anterior communicating artery and bilateral ACA branches are within normal limits. Left MCA M1 segment, bifurcation and left MCA branches are within normal limits. Right MCA M1 segment, early bifurcation, and right MCA branches are within normal limits. Venous sinuses: Patent. Anatomic variants: None. Delayed phase: Stable hypodensity at the left lateral thalamus near the posterior limb of the left internal capsule. Stable gray-white matter differentiation elsewhere. No cortically based acute infarct identified. No abnormal enhancement identified. IMPRESSION: 1. Negative for emergent large vessel occlusion. 2. Moderate left PCA P2 segment irregularity and stenosis. This could reflect atherosclerosis or thromboembolic disease and might be associated with the age indeterminate left thalamostriate artery territory lacunar infarct described yesterday. 3. Bilateral carotid bifurcation mostly soft plaque, and ICA siphon calcified plaque, without hemodynamically significant stenosis. 4. Minimal to mild vertebral and basilar artery atherosclerosis without stenosis. 5.  Calcified aortic atherosclerosis. 6.  Thyromegaly with 2 thyroid nodules which meet consensus criteria for routine follow-up Thyroid Ultrasound. 7. Pulmonary peribronchial thickening and paraseptal emphysema. Electronically Signed   By: Odessa Fleming M.D.   On: 09/05/2016 15:25  Echo - Left ventricle: The cavity size was normal. Wall thickness was   increased in a pattern of mild LVH. Systolic function was normal.   The estimated ejection fraction was in the range of 55% to 65%.   Wall motion was normal; there were no regional wall motion   abnormalities.  Subjective: Pt feels well. No new deficits  Discharge Exam: Vitals:   09/06/16 1018 09/06/16 1509  BP: 104/79 (!) 126/57  Pulse: 70 63  Resp: 18 18  Temp: 97.8 F (36.6 C) 97.7 F (36.5 C)   Vitals:   09/06/16 0108 09/06/16 0607 09/06/16 1018 09/06/16 1509  BP: (!) 141/51 (!) 124/56 104/79 (!) 126/57  Pulse: 70 70 70 63  Resp: 18 18 18 18   Temp: 98.4 F (36.9 C) 98 F (36.7 C) 97.8 F (36.6 C) 97.7 F (36.5 C)  TempSrc: Oral Oral Oral Oral  SpO2: 97% 100% 98% 98%  Weight:      Height:       General: Pt is alert, awake, not in acute distress Cardiovascular: RRR, S1/S2 +, no rubs, no gallops Respiratory: CTA bilaterally, no wheezing, no rhonchi Abdominal: Soft, NT, ND, bowel sounds + Extremities: no edema, no cyanosis Neuro: Alert, oriented, normal speech. Very mild weakness of right grip and bilateral hip flexors.  The results of significant diagnostics from this hospitalization (including imaging, microbiology, ancillary and laboratory) are listed below for reference.    Microbiology: No results found for this or any previous visit (from the past 240 hour(s)).   Labs: BNP (last 3 results) No results for input(s): BNP in the last 8760 hours. Basic Metabolic Panel:  Recent Labs Lab 09/04/16 1626 09/04/16 1639 09/05/16 1420  NA 138 141  --   K 4.4 4.3  --   CL 104 103  --   CO2 28  --   --   GLUCOSE 137* 134* 451*  BUN 15 19  --   CREATININE  0.98 1.00  --   CALCIUM 9.6  --   --    Liver Function Tests:  Recent Labs Lab 09/04/16 1626  AST 16  ALT 17  ALKPHOS 52  BILITOT 0.4  PROT 7.5  ALBUMIN 3.9   No results for input(s): LIPASE, AMYLASE in the last 168 hours. No results for input(s): AMMONIA in the last 168 hours. CBC:  Recent Labs Lab 09/04/16 1626 09/04/16 1639  WBC 12.2*  --   NEUTROABS 6.4  --   HGB 12.9 14.3  HCT 41.3 42.0  MCV 85.2  --   PLT 320  --    Cardiac Enzymes: No results for input(s): CKTOTAL, CKMB, CKMBINDEX, TROPONINI in the last 168 hours. BNP: Invalid input(s): POCBNP CBG:  Recent Labs Lab 09/05/16 1136 09/05/16 1615 09/05/16 2143 09/06/16 0639 09/06/16 1136  GLUCAP 411* 329* 436* 321* 319*   D-Dimer No results for input(s): DDIMER in the last 72 hours. Hgb A1c  Recent Labs  09/05/16 0405  HGBA1C 9.1*   Lipid Profile  Recent Labs  09/05/16 0405  CHOL 146  HDL 46  LDLCALC 77  TRIG 116  CHOLHDL 3.2   Thyroid function studies No results for input(s): TSH, T4TOTAL, T3FREE, THYROIDAB in the last 72 hours.  Invalid input(s): FREET3 Anemia work up No results for input(s): VITAMINB12, FOLATE, FERRITIN, TIBC, IRON, RETICCTPCT in the last 72 hours. Urinalysis No results found for: COLORURINE, APPEARANCEUR, LABSPEC, PHURINE, GLUCOSEU, HGBUR, BILIRUBINUR, KETONESUR, PROTEINUR, UROBILINOGEN, NITRITE, LEUKOCYTESUR Sepsis Labs Invalid input(s): PROCALCITONIN,  WBC,  LACTICIDVEN Microbiology No results found for this or any previous visit (from the past 240 hour(s)).  Time coordinating discharge: Over 30 minutes  Hazeline Junkeryan Assyria Morreale, MD  [facility] 09/06/2016, 4:37 PM Pager [phone]  If 7PM-7AM, please contact night-coverage [email or web address] Password [email or web address]

## 2016-09-06 NOTE — Progress Notes (Signed)
  Echocardiogram 2D Echocardiogram has been performed.  Arvil ChacoFoster, Alejandrina Raimer 09/06/2016, 2:48 PM

## 2016-09-15 ENCOUNTER — Ambulatory Visit: Payer: Medicare Other | Attending: Family Medicine | Admitting: Physical Therapy

## 2016-09-15 ENCOUNTER — Encounter: Payer: Self-pay | Admitting: Physical Therapy

## 2016-09-15 DIAGNOSIS — I69351 Hemiplegia and hemiparesis following cerebral infarction affecting right dominant side: Secondary | ICD-10-CM

## 2016-09-15 DIAGNOSIS — R2681 Unsteadiness on feet: Secondary | ICD-10-CM | POA: Insufficient documentation

## 2016-09-15 DIAGNOSIS — R2689 Other abnormalities of gait and mobility: Secondary | ICD-10-CM

## 2016-09-15 DIAGNOSIS — M6281 Muscle weakness (generalized): Secondary | ICD-10-CM | POA: Diagnosis present

## 2016-09-15 NOTE — Therapy (Signed)
[facility] [address] [place], [place], [address] Phone: [phone]   Fax:  [phone]  Physical Therapy Evaluation  Patient Details  Name: Claire Mcgrath MRN: [id number] Date of Birth: June 22, 1946 Referring Provider: Ralene Ok, MD (send to PCP, per pt request, as referral from hospitalist)  Encounter Date: 09/15/2016      PT End of Session - 09/15/16 1627    Visit Number 1   Number of Visits 17   Date for PT Re-Evaluation 11/14/16   Authorization Type UHC Medicare   Authorization Time Period G-Code every 10th visit   PT Start Time 1447   PT Stop Time 1535   PT Time Calculation (min) 48 min   Equipment Utilized During Treatment Gait belt   Activity Tolerance Patient tolerated treatment well   Behavior During Therapy [facility] for tasks assessed/performed      Past Medical History:  Diagnosis Date  . Asthma   . CHF (congestive heart failure) (HCC)   . Diabetes mellitus without complication (HCC)   . Heart block   . Hyperlipidemia   . Hypertension     Past Surgical History:  Procedure Laterality Date  . ABDOMINAL HYSTERECTOMY    . BREAST LUMPECTOMY     left  . EP IMPLANTABLE DEVICE    . PACEMAKER INSERTION      There were no vitals filed for this visit.       Subjective Assessment - 09/15/16 1457    Subjective Pt reports she woke up to go to the bathroom and her knees "felt funny".  She states that she was waking to the right at church and needed help to get where she was going.   Limitations Walking;Standing;House hold activities   Patient Stated Goals "Get back to doing the things I used to do before the stroke."   Currently in Pain? No/denies   Multiple Pain Sites No            [facility] PT Assessment - 09/15/16 1445      Assessment   Medical Diagnosis R sided weakness   Referring Provider Ralene Ok, MD  send to PCP, per pt request, as referral from hospitalist   Onset Date/Surgical  Date 09/04/16   Hand Dominance Right     Balance Screen   Has the patient fallen in the past 6 months No   How many times? 0   Has the patient had a decrease in activity level because of a fear of falling?  Yes   Is the patient reluctant to leave their home because of a fear of falling?  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     ROM / Strength   AROM / PROM / Strength AROM;Strength     AROM   Overall AROM  Within functional limits for tasks performed     Strength   Overall Strength Within functional limits for  tasks performed   Strength Assessment Site Shoulder;Hip;Knee;Ankle   Right/Left Shoulder Right;Left   Right Shoulder Flexion 4-/5   Left Shoulder Flexion 4/5   Right/Left Hip Right;Left   Right Hip Flexion 3/5   Right Hip ABduction 3/5   Left Hip Flexion 3+/5   Left Hip ABduction 4-/5   Right/Left Knee Left;Right   Right Knee Flexion 3+/5   Right Knee Extension 3+/5   Left Knee Flexion 4/5   Left Knee Extension 4/5   Right/Left Ankle Left;Right   Right Ankle Dorsiflexion 3+/5   Left Ankle Dorsiflexion 4/5     Transfers   Transfers Sit to Stand;Stand to Sit;Stand Pivot Transfers   Sit to Stand 5: Supervision;With upper extremity assist;With armrests;From chair/3-in-1   Stand to Sit 5: Supervision;With upper extremity assist;With armrests;To chair/3-in-1   Stand Pivot Transfers 5: Supervision;With armrests     Ambulation/Gait   Ambulation/Gait Yes    Ambulation/Gait Assistance 5: Supervision   Ambulation Distance (Feet) 30 Feet  3x30   Assistive device Rolling walker   Gait Pattern Step-through pattern;Decreased arm swing - left;Decreased arm swing - right;Decreased stride length;Decreased hip/knee flexion - right;Decreased hip/knee flexion - left;Shuffle;Trunk flexed;Wide base of support   Ambulation Surface Level;Indoor   Gait velocity 0.90 ft/sec  Indicates household ambulator     Standardized Balance Assessment   Standardized Balance Assessment Berg Balance Test;Timed Up and Go Test     Berg Balance Test   Sit to Stand Able to stand  independently using hands   Standing Unsupported Able to stand 2 minutes with supervision   Sitting with Back Unsupported but Feet Supported on Floor or Stool Able to sit safely and securely 2 minutes   Stand to Sit Uses backs of legs against chair to control descent   Transfers Needs one person to assist   Standing Unsupported with Eyes Closed Able to stand 10 seconds with supervision   Standing Ubsupported with Feet Together Able to place feet together independently but unable to hold for 30 seconds   From Standing, Reach Forward with Outstretched Arm Can reach forward >12 cm safely (5")   From Standing Position, Pick up Object from Floor Able to pick up shoe, needs supervision   From Standing Position, Turn to Look Behind Over each Shoulder Turn sideways only but maintains balance   Turn 360 Degrees Needs close supervision or verbal cueing   Standing Unsupported, Alternately Place Feet on Step/Stool Able to complete >2 steps/needs minimal assist   Standing Unsupported, One Foot in [employer] balance while stepping or standing   Standing on One Leg Tries to lift leg/unable to hold 3 seconds but remains standing independently   Total Score 29   Berg comment: Indicates significant fall risk     Timed Up and Go Test   TUG Normal TUG   Normal TUG (seconds) 28.37  Indicates high fall risk                            PT Education - 09/15/16 1626    Education provided Yes   Education Details Pt educated on POC, diagnosis, and future prognosis with PT.   Person(s) Educated Patient   Methods Explanation   Comprehension Verbalized understanding          PT Short Term Goals - 09/15/16 1639      PT SHORT TERM GOAL #1   Title Pt will be independent and verbalize understanding of initial HEP  to continue progress in therapy. (Target Date for all STGs: 10/13/16)   Time 4   Period Weeks   Status New     PT SHORT TERM GOAL #2   Title Pt will improve Berg Balance score to > or = 32/56 to indicate decr risk of falls.   Time 4   Period Weeks   Status New     PT SHORT TERM GOAL #3   Title Pt will improve TUG to < or = 24 sec to indicate decreased risk of falls.   Time 4   Period Weeks   Status New     PT SHORT TERM GOAL #4   Title Pt will improve gait speed to > or = 1.5 ft/sec to indicate incr in functional mobility and decr risk of falls.   Time 4   Period Weeks   Status New     PT SHORT TERM GOAL #5   Title Pt will ambulate 54400ft with supervision and LRAD indoors over level surfaces and ramps to indicate incr functional mobility and incr safety when ambulating at home.   Time 4   Period Weeks   Status New           PT Long Term Goals - 09/15/16 1644      PT LONG TERM GOAL #1   Title Pt will be independent and verbalize understanding with HEP and on-going fitnees program to maintain progress gained in therapy and improve overall health.   Time 8   Period Weeks   Status New     PT LONG TERM GOAL #2   Title Pt will improve Berg Balance score to > or = 40/56 to indicate a decr risk for falls.   Time 8   Period Weeks   Status New     PT LONG TERM GOAL #3   Title Pt will improve TUG to < or = 20 sec to indicate decr risk of falls.   Time 8   Period Weeks   Status New     PT LONG TERM GOAL #4   Title Pt will incr gait speed to >/= 1.8  ft/sec to indicate incr functional mobility ad decr risk of falls.   Time 8   Period Weeks   Status New     PT LONG TERM GOAL #5   Title Pt will ambulate 102100ft mod I with LRAD outdoors over level and unlevel surfaces, grass, gravel, ramps, and curbs to incr functional mobility and allow her to ambulate safely in the community and church.   Time 8   Period Weeks   Status New               Plan - 09/15/16 1628    Clinical Impression Statement Pt is a 70 year old female who presents to outpatient PT with a diagnosis of R sided weakness (09/04/16) due to unspecified CVA and resulting hospitalization (09/04/16 - 09/06/16).  PMH significant for DM type 2, HTN, asthma, obesity, hyperlipidemia, CHF, and pacemaker.  Pt demonstrated signinficant deficits in static balance as evidence by a Berg Balance score of 29/56 indicating she is a significant fall risk.  She also completed the TUG in 28.37 sec again indicating she is a high fall risk.  Pt is categorized as a household ambulator as her gait speed was recorded at 0.90 ft/sec.  During MMT testing of B LE's she demonstrates a decr in strength in the L LE that could potentially be contributing to her lack  o balance and decr gait speed.  Pt appears to have a lack of confidence in her gait and balance that will also incr her risk of falling.  Pt's condition appears to be evolving and of moderate complexity.  She would benefit from skilled PT to address said impairments.   Rehab Potential Good   Clinical Impairments Affecting Rehab Potential Pacemaker, HTN, CHF, asthma,    PT Frequency 2x / week   PT Duration 8 weeks   PT Treatment/Interventions ADLs/Self Care Home Management;Functional mobility training;Stair training;Gait training;DME Instruction;Therapeutic activities;Therapeutic exercise;Balance training;Neuromuscular re-education;Patient/family education;Orthotic Fit/Training;Manual techniques;Energy conservation   PT Next Visit Plan Initiate HEP  and begin static balance exercises and functional gait training   Consulted and Agree with Plan of Care Patient      Patient will benefit from skilled therapeutic intervention in order to improve the following deficits and impairments:  Abnormal gait, Cardiopulmonary status limiting activity, Decreased activity tolerance, Decreased balance, Decreased mobility, Decreased knowledge of use of DME, Decreased endurance, Decreased safety awareness, Decreased strength, Impaired perceived functional ability, Impaired flexibility, Improper body mechanics, Obesity, Pain  Visit Diagnosis: Hemiplegia and hemiparesis following cerebral infarction affecting right dominant side (HCC) - Plan: PT plan of care cert/re-cert  Unsteadiness on feet - Plan: PT plan of care cert/re-cert  Other abnormalities of gait and mobility - Plan: PT plan of care cert/re-cert  Muscle weakness (generalized) - Plan: PT plan of care cert/re-cert      G-Codes - 2016/10/09 1721    Functional Assessment Tool Used TUG = 28/37 sec with RW; Berg = 29/56; gait velocity = 0.9 ft/sec with RW   Functional Limitation Mobility: Walking and moving around   Mobility: Walking and Moving Around Current Status [phone]) At least 40 percent but less than 60 percent impaired, limited or restricted   Mobility: Walking and Moving Around Goal Status [phone]) At least 20 percent but less than 40 percent impaired, limited or restricted       Problem List Patient Active Problem List   Diagnosis Date Noted  . Right sided weakness 09/04/2016  . Acute ischemic stroke (HCC) 09/04/2016  . DM2 (diabetes mellitus, type 2) (HCC) 09/04/2016  . HTN (hypertension) 09/04/2016  . Asthma 04/27/2014  . Hyperlipemia 04/27/2014  . CHF (congestive heart failure) (HCC) 04/27/2014  . Obesity 04/27/2014    Claire Mcgrath, Claire Mcgrath 10-09-2016, 5:29 PM  [facility] [address] [place], [place],  [address] Phone: [phone]   Fax:  [phone]  Name: Claire Mcgrath MRN: [id number] Date of Birth: [birth year]

## 2016-09-19 ENCOUNTER — Encounter: Payer: Self-pay | Admitting: Physical Therapy

## 2016-09-19 ENCOUNTER — Telehealth: Payer: Self-pay | Admitting: Physical Therapy

## 2016-09-19 ENCOUNTER — Ambulatory Visit: Payer: Medicare Other | Attending: Family Medicine | Admitting: Physical Therapy

## 2016-09-19 DIAGNOSIS — R2689 Other abnormalities of gait and mobility: Secondary | ICD-10-CM

## 2016-09-19 DIAGNOSIS — I69351 Hemiplegia and hemiparesis following cerebral infarction affecting right dominant side: Secondary | ICD-10-CM

## 2016-09-19 DIAGNOSIS — R2681 Unsteadiness on feet: Secondary | ICD-10-CM

## 2016-09-19 DIAGNOSIS — M6281 Muscle weakness (generalized): Secondary | ICD-10-CM | POA: Diagnosis present

## 2016-09-19 NOTE — Patient Instructions (Addendum)
Bridge    Lie back, legs bent, arms at sides. Lift hips up off mat, hold for 5 seconds. Slowly lower hips back down. Repeat __10_ times. Do __1-2__ sessions per day.  http://pm.exer.us/55   Copyright  VHI. All rights reserved.    Functional Quadriceps: Sit to Stand    Sit on edge of chair, feet flat on floor. Stand up completely, knees and back straight. Slowly sit back down. Use arms only as needed for balance, have your legs do most if not all of the work. Repeat _10_ times per set. Do _1_ sets per session. Do _1-2_ sessions per day.  http://orth.exer.us/735   Copyright  VHI. All rights reserved.    Single Leg - Eyes Open    Holding onto something sturdy for balance, lift right leg while standing on left leg. Progress to removing hands from support surface for longer periods of time. Hold__10__ seconds.  Repeat with other leg: standing on right leg and lifting up the left foot while holding onto something sturdy. Progress to lifting hands up as your balance allows. Hold for 10 seconds.  Repeat __3__ times each leg.  Do __1-2__ sessions per day.  Copyright  VHI. All rights reserved.    Tandem Stance    Holding on for balance to something sturdy: 1. Right foot in front of left, heel touching toe, looking straight ahead. Hold this position for 10 seconds. Progressively lift hands off support as balance allows.  2. Left foot in front of right, heel touching toe, looking straight ahead. Hold this position for 10 seconds. Progressively lift hands off support as balance allows.  Perform 3 times with each foot in the front. 1-2 times a day.  Copyright  VHI. All rights reserved.

## 2016-09-19 NOTE — Telephone Encounter (Signed)
Dr. Ludwig ClarksMoreira,  A physical [facility]. The patient reports increased difficulty with handwriting. Therefore, pt may benefit from outpatient OT services.   If you agree, please place an order for occupational therapy.   Thank you,  Jorje GuildBlair Cameka Rae, PT, DPT [facility] [address] [place], [place], [address] Phone: [phone]   Fax:  [phone] 09/19/16, 5:35 PM

## 2016-09-19 NOTE — Therapy (Signed)
[facility] [address] [place], [place], [address] Phone: [phone]   Fax:  [phone]  Physical Therapy Treatment  Patient Details  Name: Claire Mcgrath MRN: [id number] Date of Birth: [birth year] Referring Provider: Ralene Ok, MD (send to PCP, per pt request, as referral from hospitalist)  Encounter Date: 09/19/2016      PT End of Session - 09/19/16 1108    Visit Number 2   Number of Visits 17   Date for PT Re-Evaluation 11/14/16   Authorization Type UHC Medicare   Authorization Time Period G-Code every 10th visit   PT Start Time 1105   PT Stop Time 1145   PT Time Calculation (min) 40 min   Equipment Utilized During Treatment Gait belt   Activity Tolerance Patient tolerated treatment well   Behavior During Therapy WFL for tasks assessed/performed      Past Medical History:  Diagnosis Date  . Asthma   . CHF (congestive heart failure) (HCC)   . Diabetes mellitus without complication (HCC)   . Heart block   . Hyperlipidemia   . Hypertension     Past Surgical History:  Procedure Laterality Date  . ABDOMINAL HYSTERECTOMY    . BREAST LUMPECTOMY     left  . EP IMPLANTABLE DEVICE    . PACEMAKER INSERTION      There were no vitals filed for this visit.      Subjective Assessment - 09/19/16 1108    Subjective No new complaints. No falls or pain to report.   Limitations Walking;Standing;House hold activities   Patient Stated Goals "Get back to doing the things I used to do before the stroke."   Currently in Pain? No/denies   Pain Score 0-No pain             [facility] Adult PT Treatment/Exercise - 09/19/16 1132      Transfers   Transfers Sit to Stand;Stand to Sit   Sit to Stand 5: Supervision;With upper extremity assist;With armrests;From chair/3-in-1   Sit to Stand Details Verbal cues for sequencing   Sit to Stand Details (indicate cue type and reason) cues to scoot to edge of surface and for  anterior weight shifting to assist with standing.   Stand to Sit 5: Supervision;With upper extremity assist;With armrests;To chair/3-in-1   Stand to Sit Details (indicate cue type and reason) Verbal cues for precautions/safety   Stand to Sit Details cues to fully turn to align with surface and to reach back with sitting down.      Ambulation/Gait   Ambulation/Gait Yes   Ambulation/Gait Assistance 5: Supervision   Ambulation/Gait Assistance Details pt with good foot clearnance today with heel>toe progression. narrowed base of support today with decreased step/stride length noted. Excessive UE reliance on RW, cues needed to relax shoulders and maintain upright posture.    Ambulation Distance (Feet) 230 Feet   Assistive device Rolling walker   Gait Pattern Step-through pattern;Decreased stride length;Decreased hip/knee flexion - right;Decreased hip/knee flexion - left;Trunk flexed;Narrow base of support   Ambulation Surface Level;Indoor     Issued the following to HEP today:  Bridge    Lie back, legs bent, arms at sides. Lift hips up off mat, hold for 5 seconds. Slowly lower hips back down. Repeat __10_ times. Do __1-2__ sessions per day.  http://pm.exer.us/55   Copyright  VHI. All rights reserved.    Functional Quadriceps: Sit to Stand    Sit on edge of chair, feet flat on floor. Stand  up completely, knees and back straight. Slowly sit back down. Use arms only as needed for balance, have your legs do most if not all of the work. Repeat _10_ times per set. Do _1_ sets per session. Do _1-2_ sessions per day.  http://orth.exer.us/735   Copyright  VHI. All rights reserved.    Single Leg - Eyes Open    Holding onto something sturdy for balance, lift right leg while standing on left leg. Progress to removing hands from support surface for longer periods of time. Hold__10__ seconds.  Repeat with other leg: standing on right leg and lifting up the left foot while holding onto  something sturdy. Progress to lifting hands up as your balance allows. Hold for 10 seconds.  Repeat __3__ times each leg.  Do __1-2__ sessions per day.  Copyright  VHI. All rights reserved.    Tandem Stance    Holding on for balance to something sturdy: 1. Right foot in front of left, heel touching toe, looking straight ahead. Hold this position for 10 seconds. Progressively lift hands off support as balance allows.  2. Left foot in front of right, heel touching toe, looking straight ahead. Hold this position for 10 seconds. Progressively lift hands off support as balance allows.  Perform 3 times with each foot in the front. 1-2 times a day.  Copyright  VHI. All rights reserved.           PT Education - 09/19/16 1130    Education provided Yes   Education Details HEP: for LE strengthening and balance   Person(s) Educated Patient   Methods Explanation;Demonstration;Verbal cues;Handout   Comprehension Verbalized understanding;Returned demonstration;Verbal cues required;Need further instruction          PT Short Term Goals - 09/15/16 1639      PT SHORT TERM GOAL #1   Title Pt will be independent and verbalize understanding of initial HEP to continue progress in therapy. (Target Date for all STGs: 10/13/16)   Time 4   Period Weeks   Status New     PT SHORT TERM GOAL #2   Title Pt will improve Berg Balance score to > or = 32/56 to indicate decr risk of falls.   Time 4   Period Weeks   Status New     PT SHORT TERM GOAL #3   Title Pt will improve TUG to < or = 24 sec to indicate decreased risk of falls.   Time 4   Period Weeks   Status New     PT SHORT TERM GOAL #4   Title Pt will improve gait speed to > or = 1.5 ft/sec to indicate incr in functional mobility and decr risk of falls.   Time 4   Period Weeks   Status New     PT SHORT TERM GOAL #5   Title Pt will ambulate 577ft with supervision and LRAD indoors over level surfaces and ramps to indicate incr  functional mobility and incr safety when ambulating at home.   Time 4   Period Weeks   Status New           PT Long Term Goals - 09/15/16 1644      PT LONG TERM GOAL #1   Title Pt will be independent and verbalize understanding with HEP and on-going fitnees program to maintain progress gained in therapy and improve overall health.   Time 8   Period Weeks   Status New     PT LONG TERM GOAL #  2   Title Pt will improve Berg Balance score to > or = 40/56 to indicate a decr risk for falls.   Time 8   Period Weeks   Status New     PT LONG TERM GOAL #3   Title Pt will improve TUG to < or = 20 sec to indicate decr risk of falls.   Time 8   Period Weeks   Status New     PT LONG TERM GOAL #4   Title Pt will incr gait speed to >/= 1.8 ft/sec to indicate incr functional mobility ad decr risk of falls.   Time 8   Period Weeks   Status New     PT LONG TERM GOAL #5   Title Pt will ambulate 107400ft mod I with LRAD outdoors over level and unlevel surfaces, grass, gravel, ramps, and curbs to incr functional mobility and allow her to ambulate safely in the community and church.   Time 8   Period Weeks   Status New            Plan - 09/19/16 1108    Clinical Impression Statement today's session focused on initiation of HEP for LE strengthening and static standing balance. No issues reported or noted with performance in session. Remainder of session worked on gait with RW. Pt with increased distance today and less gait deviations noted than on eval. Pt is making steady progress toward goals and should benefit from continued PT to progress toward unmet goals.   Rehab Potential Good   Clinical Impairments Affecting Rehab Potential Pacemaker, HTN, CHF, asthma,    PT Frequency 2x / week   PT Duration 8 weeks   PT Treatment/Interventions ADLs/Self Care Home Management;Functional mobility training;Stair training;Gait training;DME Instruction;Therapeutic activities;Therapeutic  exercise;Balance training;Neuromuscular re-education;Patient/family education;Orthotic Fit/Training;Manual techniques;Energy conservation   PT Next Visit Plan work on dynamic standing balance in parallel bars/at counter, static standing balance in corner and gait with RW   Consulted and Agree with Plan of Care Patient      Patient will benefit from skilled therapeutic intervention in order to improve the following deficits and impairments:  Abnormal gait, Cardiopulmonary status limiting activity, Decreased activity tolerance, Decreased balance, Decreased mobility, Decreased knowledge of use of DME, Decreased endurance, Decreased safety awareness, Decreased strength, Impaired perceived functional ability, Impaired flexibility, Improper body mechanics, Obesity, Pain  Visit Diagnosis: 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 Right sided weakness 09/04/2016  . Acute ischemic stroke (HCC) 09/04/2016  . DM2 (diabetes mellitus, type 2) (HCC) 09/04/2016  . HTN (hypertension) 09/04/2016  . Asthma 04/27/2014  . Hyperlipemia 04/27/2014  . CHF (congestive heart failure) (HCC) 04/27/2014  . Obesity 04/27/2014    Sallyanne KusterKathy Mariano Doshi, PTA, [facility] [address], [address] [place], [place] [address] [phone] 09/19/16, 12:00 PM   Name: Claire Mcgrath MRN: [id number] Date of Birth: [birth year]

## 2016-09-20 ENCOUNTER — Encounter: Payer: Self-pay | Admitting: Physical Therapy

## 2016-09-20 ENCOUNTER — Ambulatory Visit: Payer: Medicare Other | Admitting: Physical Therapy

## 2016-09-20 DIAGNOSIS — R2689 Other abnormalities of gait and mobility: Secondary | ICD-10-CM

## 2016-09-20 DIAGNOSIS — M6281 Muscle weakness (generalized): Secondary | ICD-10-CM

## 2016-09-20 DIAGNOSIS — I69351 Hemiplegia and hemiparesis following cerebral infarction affecting right dominant side: Secondary | ICD-10-CM

## 2016-09-20 DIAGNOSIS — R2681 Unsteadiness on feet: Secondary | ICD-10-CM

## 2016-09-20 NOTE — Therapy (Signed)
[facility] [address] [place], [place], [address] Phone: [phone]   Fax:  [phone]  Physical Therapy Treatment  Patient Details  Name: Claire Mcgrath MRN: [id number] Date of Birth: [birth year] Referring Provider: Ralene Ok, MD (send to PCP, per pt request, as referral from hospitalist)  Encounter Date: 09/20/2016      PT End of Session - 09/20/16 1107    Visit Number 3   Number of Visits 17   Date for PT Re-Evaluation 11/14/16   Authorization Type UHC Medicare   Authorization Time Period G-Code every 10th visit   PT Start Time 1103   PT Stop Time 1143   PT Time Calculation (min) 40 min   Equipment Utilized During Treatment Gait belt   Activity Tolerance Patient tolerated treatment well   Behavior During Therapy WFL for tasks assessed/performed      Past Medical History:  Diagnosis Date  . Asthma   . CHF (congestive heart failure) (HCC)   . Diabetes mellitus without complication (HCC)   . Heart block   . Hyperlipidemia   . Hypertension     Past Surgical History:  Procedure Laterality Date  . ABDOMINAL HYSTERECTOMY    . BREAST LUMPECTOMY     left  . EP IMPLANTABLE DEVICE    . PACEMAKER INSERTION      There were no vitals filed for this visit.      Subjective Assessment - 09/20/16 1106    Subjective No new complaints. No falls or pain to report. Tired today after not sleeping well last night.    Limitations Walking;Standing;House hold activities   Patient Stated Goals "Get back to doing the things I used to do before the stroke."   Currently in Pain? No/denies   Pain Score 0-No pain              [facility] Adult PT Treatment/Exercise - 09/20/16 1107      Transfers   Transfers Sit to Stand;Stand to Sit   Sit to Stand 5: Supervision;With upper extremity assist;With armrests;From chair/3-in-1   Stand to Sit 5: Supervision;With upper extremity assist;With armrests;To chair/3-in-1     Ambulation/Gait   Ambulation/Gait Yes   Ambulation/Gait Assistance 5: Supervision   Ambulation Distance (Feet) 460 Feet   Assistive device Rolling walker   Gait Pattern Step-through pattern;Decreased stride length;Decreased hip/knee flexion - right;Decreased hip/knee flexion - left;Trunk flexed;Narrow base of support   Ambulation Surface Level;Indoor     High Level Balance   High Level Balance Activities Side stepping;Tandem walking;Marching forwards;Marching backwards  tandem fwd/bwd, toe walking fwd/bwd, heel walking fwd/bwd   High Level Balance Comments at counter: bil UE support with light touch for side stepping left<>right x 3 laps each way; single UE support with other x 3 laps each way with min guard to min assist for balance and cues on posture and weight shifting. cues to slow down for improved control and balance     Knee/Hip Exercises: Standing   Heel Raises Both;1 set;10 reps;5 seconds;Limitations   Heel Raises Limitations iwth UE support on counter top, cues on posture and ex form   Hip Abduction AROM;Stengthening;Both;1 set;10 reps;Knee straight;Limitations   Abduction Limitations with red theraband resistance, alternating LE's x 10 each with bil UE support on counter top   Hip Extension AROM;Stengthening;Both;1 set;10 reps;Knee straight;Limitations              PT Short Term Goals - 09/15/16 1639      PT SHORT  TERM GOAL #1   Title Pt will be independent and verbalize understanding of initial HEP to continue progress in therapy. (Target Date for all STGs: 10/13/16)   Time 4   Period Weeks   Status New     PT SHORT TERM GOAL #2   Title Pt will improve Berg Balance score to > or = 32/56 to indicate decr risk of falls.   Time 4   Period Weeks   Status New     PT SHORT TERM GOAL #3   Title Pt will improve TUG to < or = 24 sec to indicate decreased risk of falls.   Time 4   Period Weeks   Status New     PT SHORT TERM GOAL #4   Title Pt will improve gait  speed to > or = 1.5 ft/sec to indicate incr in functional mobility and decr risk of falls.   Time 4   Period Weeks   Status New     PT SHORT TERM GOAL #5   Title Pt will ambulate 58600ft with supervision and LRAD indoors over level surfaces and ramps to indicate incr functional mobility and incr safety when ambulating at home.   Time 4   Period Weeks   Status New           PT Long Term Goals - 09/15/16 1644      PT LONG TERM GOAL #1   Title Pt will be independent and verbalize understanding with HEP and on-going fitnees program to maintain progress gained in therapy and improve overall health.   Time 8   Period Weeks   Status New     PT LONG TERM GOAL #2   Title Pt will improve Berg Balance score to > or = 40/56 to indicate a decr risk for falls.   Time 8   Period Weeks   Status New     PT LONG TERM GOAL #3   Title Pt will improve TUG to < or = 20 sec to indicate decr risk of falls.   Time 8   Period Weeks   Status New     PT LONG TERM GOAL #4   Title Pt will incr gait speed to >/= 1.8 ft/sec to indicate incr functional mobility ad decr risk of falls.   Time 8   Period Weeks   Status New     PT LONG TERM GOAL #5   Title Pt will ambulate 102700ft mod I with LRAD outdoors over level and unlevel surfaces, grass, gravel, ramps, and curbs to incr functional mobility and allow her to ambulate safely in the community and church.   Time 8   Period Weeks   Status New            Plan - 09/20/16 1107    Clinical Impression Statement Today's session continued to address gait with RW, LE strengthening and balance with decreased UE support. No issues reported. Pt does need cues to relax and reminders to breathe with exercises and balance activities. Pt was challenged by balance activities today. Pt is making steady progress toward goals and should benefit from continued PT to progress toward unmet goals.                              Rehab Potential Good   Clinical Impairments  Affecting Rehab Potential Pacemaker, HTN, CHF, asthma,    PT Frequency 2x / week   PT  Duration 8 weeks   PT Treatment/Interventions ADLs/Self Care Home Management;Functional mobility training;Stair training;Gait training;DME Instruction;Therapeutic activities;Therapeutic exercise;Balance training;Neuromuscular re-education;Patient/family education;Orthotic Fit/Training;Manual techniques;Energy conservation   PT Next Visit Plan work on dynamic standing balance in parallel bars/at counter, static standing balance in corner and gait with RW, ? introduce cane for gait in short distances   Consulted and Agree with Plan of Care Patient      Patient will benefit from skilled therapeutic intervention in order to improve the following deficits and impairments:  Abnormal gait, Cardiopulmonary status limiting activity, Decreased activity tolerance, Decreased balance, Decreased mobility, Decreased knowledge of use of DME, Decreased endurance, Decreased safety awareness, Decreased strength, Impaired perceived functional ability, Impaired flexibility, Improper body mechanics, Obesity, Pain  Visit Diagnosis: 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 Right sided weakness 09/04/2016  . Acute ischemic stroke (HCC) 09/04/2016  . DM2 (diabetes mellitus, type 2) (HCC) 09/04/2016  . HTN (hypertension) 09/04/2016  . Asthma 04/27/2014  . Hyperlipemia 04/27/2014  . CHF (congestive heart failure) (HCC) 04/27/2014  . Obesity 04/27/2014    Sallyanne Kuster, PTA, [facility] [address] [place], [place] [address] [phone] 09/20/16, 12:31 PM   Name: Alencia Gordon Warrior MRN: [id number] Date of Birth: 1946/01/06

## 2016-09-28 ENCOUNTER — Encounter: Payer: Self-pay | Admitting: Physical Therapy

## 2016-09-28 ENCOUNTER — Ambulatory Visit: Payer: Medicare Other | Admitting: Physical Therapy

## 2016-09-28 DIAGNOSIS — R2689 Other abnormalities of gait and mobility: Secondary | ICD-10-CM

## 2016-09-28 DIAGNOSIS — M6281 Muscle weakness (generalized): Secondary | ICD-10-CM

## 2016-09-28 DIAGNOSIS — R2681 Unsteadiness on feet: Secondary | ICD-10-CM

## 2016-09-28 DIAGNOSIS — I69351 Hemiplegia and hemiparesis following cerebral infarction affecting right dominant side: Secondary | ICD-10-CM | POA: Diagnosis not present

## 2016-09-29 ENCOUNTER — Ambulatory Visit: Payer: Medicare Other | Admitting: Physical Therapy

## 2016-09-29 ENCOUNTER — Encounter: Payer: Self-pay | Admitting: Physical Therapy

## 2016-09-29 DIAGNOSIS — I69351 Hemiplegia and hemiparesis following cerebral infarction affecting right dominant side: Secondary | ICD-10-CM | POA: Diagnosis not present

## 2016-09-29 DIAGNOSIS — R2689 Other abnormalities of gait and mobility: Secondary | ICD-10-CM

## 2016-09-29 DIAGNOSIS — R2681 Unsteadiness on feet: Secondary | ICD-10-CM

## 2016-09-29 DIAGNOSIS — M6281 Muscle weakness (generalized): Secondary | ICD-10-CM

## 2016-09-29 NOTE — Therapy (Signed)
[facility] [address] [place], [place], [address] Phone: [phone]   Fax:  [phone]  Physical Therapy Treatment  Patient Details  Name: Claire Mcgrath MRN: [id number] Date of Birth: 1946-02-26 Referring Provider: Ralene Ok, MD (send to PCP, per pt request, as referral from hospitalist)  Encounter Date: 09/29/2016      PT End of Session - 09/29/16 1111    Visit Number 5   Number of Visits 17   Date for PT Re-Evaluation 11/14/16   Authorization Type UHC Medicare   Authorization Time Period G-Code every 10th visit   PT Start Time 1105   PT Stop Time 1145   PT Time Calculation (min) 40 min   Equipment Utilized During Treatment Gait belt   Activity Tolerance Patient tolerated treatment well   Behavior During Therapy WFL for tasks assessed/performed      Past Medical History:  Diagnosis Date  . Asthma   . CHF (congestive heart failure) (HCC)   . Diabetes mellitus without complication (HCC)   . Heart block   . Hyperlipidemia   . Hypertension     Past Surgical History:  Procedure Laterality Date  . ABDOMINAL HYSTERECTOMY    . BREAST LUMPECTOMY     left  . EP IMPLANTABLE DEVICE    . PACEMAKER INSERTION      There were no vitals filed for this visit.      Subjective Assessment - 09/29/16 1110    Subjective No new complaints. To session today with straight cane. Reports has been walking in home with nothing and community with cane since yesterday's session;.   Limitations Walking;Standing;House hold activities   Patient Stated Goals "Get back to doing the things I used to do before the stroke."   Currently in Pain? No/denies   Pain Score 0-No pain             [facility] Adult PT Treatment/Exercise - 09/29/16 1112      Transfers   Transfers Sit to Stand;Stand to Sit   Sit to Stand 6: Modified independent (Device/Increase time);With upper extremity assist;From bed;From chair/3-in-1;With  armrests   Stand to Sit 6: Modified independent (Device/Increase time);With upper extremity assist;With armrests;To bed;To chair/3-in-1     Ambulation/Gait   Ambulation/Gait Yes   Ambulation/Gait Assistance 5: Supervision   Ambulation Distance (Feet) 450 Feet  x1, 500 x1   Assistive device None   Gait Pattern Step-through pattern;Decreased stride length;Decreased hip/knee flexion - right;Decreased hip/knee flexion - left;Trunk flexed;Narrow base of support   Ambulation Surface Level;Indoor;Unlevel;Outdoor;Paved        09/29/16 1132  Balance Exercises: Standing  Rockerboard Anterior/posterior;Lateral;Head turns;EO;EC;Other time (comment);10 reps  Balance Exercises: Standing  Rebounder Limitations performed both ways on balance board without UE support with min guard to min assist for balance: EO rocking board with emphasis on tall posture; holding board steady- EC no head movements progressing to EO head movements up<>down, left<>right progressing to EC head movements both ways.                                       PT Education - 09/29/16 1528    Education provided Yes   Education Details gave pt information on silver sneakers and on [employer] near her for community fitness post therapy   Person(s) Educated Patient   Methods Explanation;Demonstration;Verbal cues;Handout   Comprehension Returned demonstration;Verbalized understanding  PT Short Term Goals - 09/15/16 1639      PT SHORT TERM GOAL #1   Title Pt will be independent and verbalize understanding of initial HEP to continue progress in therapy. (Target Date for all STGs: 10/13/16)   Time 4   Period Weeks   Status New     PT SHORT TERM GOAL #2   Title Pt will improve Berg Balance score to > or = 32/56 to indicate decr risk of falls.   Time 4   Period Weeks   Status New     PT SHORT TERM GOAL #3   Title Pt will improve TUG to < or = 24 sec to indicate decreased risk of falls.   Time 4   Period  Weeks   Status New     PT SHORT TERM GOAL #4   Title Pt will improve gait speed to > or = 1.5 ft/sec to indicate incr in functional mobility and decr risk of falls.   Time 4   Period Weeks   Status New     PT SHORT TERM GOAL #5   Title Pt will ambulate 539ft with supervision and LRAD indoors over level surfaces and ramps to indicate incr functional mobility and incr safety when ambulating at home.   Time 4   Period Weeks   Status New           PT Long Term Goals - 09/15/16 1644      PT LONG TERM GOAL #1   Title Pt will be independent and verbalize understanding with HEP and on-going fitnees program to maintain progress gained in therapy and improve overall health.   Time 8   Period Weeks   Status New     PT LONG TERM GOAL #2   Title Pt will improve Berg Balance score to > or = 40/56 to indicate a decr risk for falls.   Time 8   Period Weeks   Status New     PT LONG TERM GOAL #3   Title Pt will improve TUG to < or = 20 sec to indicate decr risk of falls.   Time 8   Period Weeks   Status New     PT LONG TERM GOAL #4   Title Pt will incr gait speed to >/= 1.8 ft/sec to indicate incr functional mobility ad decr risk of falls.   Time 8   Period Weeks   Status New     PT LONG TERM GOAL #5   Title Pt will ambulate 1061ft mod I with LRAD outdoors over level and unlevel surfaces, grass, gravel, ramps, and curbs to incr functional mobility and allow her to ambulate safely in the community and church.   Time 8   Period Weeks   Status New            Plan - 09/29/16 1111    Clinical Impression Statement Today's session continued to address gait and balance deficits. Pt needed increased assistance for balance on complaint surfaces vs on stable floor. No significant balance losses noted with session. Pt continues to make steady progress toward goals and should benefit from continued PT to progress toward unmet goals.   Rehab Potential Good   Clinical Impairments  Affecting Rehab Potential Pacemaker, HTN, CHF, asthma,    PT Frequency 2x / week   PT Duration 8 weeks   PT Treatment/Interventions ADLs/Self Care Home Management;Functional mobility training;Stair training;Gait training;DME Instruction;Therapeutic activities;Therapeutic exercise;Balance training;Neuromuscular re-education;Patient/family education;Orthotic Fit/Training;Manual techniques;Energy conservation  PT Next Visit Plan work on dynamic standing balance, static standing balance in corner and gait without AD   Consulted and Agree with Plan of Care Patient      Patient will benefit from skilled therapeutic intervention in order to improve the following deficits and impairments:  Abnormal gait, Cardiopulmonary status limiting activity, Decreased activity tolerance, Decreased balance, Decreased mobility, Decreased knowledge of use of DME, Decreased endurance, Decreased safety awareness, Decreased strength, Impaired perceived functional ability, Impaired flexibility, Improper body mechanics, Obesity, Pain  Visit Diagnosis: 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 Right sided weakness 09/04/2016  . Acute ischemic stroke (HCC) 09/04/2016  . DM2 (diabetes mellitus, type 2) (HCC) 09/04/2016  . HTN (hypertension) 09/04/2016  . Asthma 04/27/2014  . Hyperlipemia 04/27/2014  . CHF (congestive heart failure) (HCC) 04/27/2014  . Obesity 04/27/2014    Sallyanne KusterKathy Audry Pecina, PTA, [facility] [address] [place], [place] [address] [phone] 10/01/16, 10:24 PM   Name: Claire Mcgrath MRN: [id number] Date of Birth: 1946-07-13

## 2016-09-29 NOTE — Therapy (Signed)
[facility] [address] [place], [place], [address] Phone: [phone]   Fax:  [phone]  Physical Therapy Treatment  Patient Details  Name: Claire Mcgrath MRN: [id number] Date of Birth: 1946/03/04 Referring Provider: Ralene Ok, MD (send to PCP, per pt request, as referral from hospitalist)  Encounter Date: 09/28/2016      PT End of Session - 09/28/16 1024    Visit Number 4   Number of Visits 17   Date for PT Re-Evaluation 11/14/16   Authorization Type [employer]HC Medicare   Authorization Time Period G-Code every 10th visit   PT Start Time 1018   PT Stop Time 1100   PT Time Calculation (min) 42 min   Equipment Utilized During Treatment Gait belt   Activity Tolerance Patient tolerated treatment well   Behavior During Therapy [facility] for tasks assessed/performed      Past Medical History:  Diagnosis Date  . Asthma   . CHF (congestive heart failure) (HCC)   . Diabetes mellitus without complication (HCC)   . Heart block   . Hyperlipidemia   . Hypertension     Past Surgical History:  Procedure Laterality Date  . ABDOMINAL HYSTERECTOMY    . BREAST LUMPECTOMY     left  . EP IMPLANTABLE DEVICE    . PACEMAKER INSERTION      There were no vitals filed for this visit.      Subjective Assessment - 09/28/16 1024    Subjective No new complaints. No falls or pain to report. Has been walking around her house without an AD for the past few days without any issues- not holding onto walls/furniture as well.    Limitations Walking;Standing;House hold activities   Patient Stated Goals "Get back to doing the things I used to do before the stroke."   Currently in Pain? No/denies   Pain Score 0-No pain            [facility] Adult PT Treatment/Exercise - 09/28/16 1025      Transfers   Transfers Sit to Stand;Stand to Sit   Sit to Stand 6: Modified independent (Device/Increase time);With upper extremity assist;From bed;From  chair/3-in-1;With armrests   Stand to Sit 6: Modified independent (Device/Increase time);With upper extremity assist;With armrests;To bed;To chair/3-in-1     Ambulation/Gait   Ambulation/Gait Yes   Ambulation/Gait Assistance 5: Supervision;4: Min guard   Ambulation/Gait Assistance Details cues needed for sequencing with cane, occasional balance loss with pt self correcting when using cane. mostly supervison for gait with no AD                             Ambulation Distance (Feet) 450 Feet  x1 with cane; 650 x1, 500 in/outdoor without AD   Assistive device Straight cane;None   Gait Pattern Step-through pattern;Decreased stride length;Decreased hip/knee flexion - right;Decreased hip/knee flexion - left;Trunk flexed;Narrow base of support   Ambulation Surface Level;Indoor   Stairs Yes   Stairs Assistance 5: Supervision;4: Min guard   Stairs Assistance Details (indicate cue type and reason) supervison with step to pattern on 1st rep; min guard progressing to supervision with reciprocal stepping for last 2 reps.   Stair Management Technique One rail Right;One rail Left;Step to pattern;Alternating pattern;Forwards   Number of Stairs 4  X 3 reps             PT Short Term Goals - 09/15/16 1639      PT SHORT  TERM GOAL #1   Title Pt will be independent and verbalize understanding of initial HEP to continue progress in therapy. (Target Date for all STGs: 10/13/16)   Time 4   Period Weeks   Status New     PT SHORT TERM GOAL #2   Title Pt will improve Berg Balance score to > or = 32/56 to indicate decr risk of falls.   Time 4   Period Weeks   Status New     PT SHORT TERM GOAL #3   Title Pt will improve TUG to < or = 24 sec to indicate decreased risk of falls.   Time 4   Period Weeks   Status New     PT SHORT TERM GOAL #4   Title Pt will improve gait speed to > or = 1.5 ft/sec to indicate incr in functional mobility and decr risk of falls.   Time 4   Period Weeks   Status New      PT SHORT TERM GOAL #5   Title Pt will ambulate 56400ft with supervision and LRAD indoors over level surfaces and ramps to indicate incr functional mobility and incr safety when ambulating at home.   Time 4   Period Weeks   Status New           PT Long Term Goals - 09/15/16 1644      PT LONG TERM GOAL #1   Title Pt will be independent and verbalize understanding with HEP and on-going fitnees program to maintain progress gained in therapy and improve overall health.   Time 8   Period Weeks   Status New     PT LONG TERM GOAL #2   Title Pt will improve Berg Balance score to > or = 40/56 to indicate a decr risk for falls.   Time 8   Period Weeks   Status New     PT LONG TERM GOAL #3   Title Pt will improve TUG to < or = 20 sec to indicate decr risk of falls.   Time 8   Period Weeks   Status New     PT LONG TERM GOAL #4   Title Pt will incr gait speed to >/= 1.8 ft/sec to indicate incr functional mobility ad decr risk of falls.   Time 8   Period Weeks   Status New     PT LONG TERM GOAL #5   Title Pt will ambulate 103900ft mod I with LRAD outdoors over level and unlevel surfaces, grass, gravel, ramps, and curbs to incr functional mobility and allow her to ambulate safely in the community and church.   Time 8   Period Weeks   Status New            Plan - 09/28/16 1024    Clinical Impression Statement today's session addressed gait with LRAD. Pt stable without AD for short house hold and communtiy distances. Did have some difficulty sequencing straight cane that improved with instruction and practice in session. Pt advised it's okay to use no AD in home and cane vs RW in community depending on her fatigue level that day. Pt verbalized understanding. Pt is making steady progress toward goals and should benefit from continued PT to progress toward unmet goals.  Rehab Potential Good   Clinical Impairments Affecting Rehab Potential  Pacemaker, HTN, CHF, asthma,    PT Frequency 2x / week   PT Duration 8 weeks   PT Treatment/Interventions ADLs/Self Care Home Management;Functional mobility training;Stair training;Gait training;DME Instruction;Therapeutic activities;Therapeutic exercise;Balance training;Neuromuscular re-education;Patient/family education;Orthotic Fit/Training;Manual techniques;Energy conservation   PT Next Visit Plan work on dynamic standing balance, static standing balance in corner and gait without AD   Consulted and Agree with Plan of Care Patient      Patient will benefit from skilled therapeutic intervention in order to improve the following deficits and impairments:  Abnormal gait, Cardiopulmonary status limiting activity, Decreased activity tolerance, Decreased balance, Decreased mobility, Decreased knowledge of use of DME, Decreased endurance, Decreased safety awareness, Decreased strength, Impaired perceived functional ability, Impaired flexibility, Improper body mechanics, Obesity, Pain  Visit Diagnosis: 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 Right sided weakness 09/04/2016  . Acute ischemic stroke (HCC) 09/04/2016  . DM2 (diabetes mellitus, type 2) (HCC) 09/04/2016  . HTN (hypertension) 09/04/2016  . Asthma 04/27/2014  . Hyperlipemia 04/27/2014  . CHF (congestive heart failure) (HCC) 04/27/2014  . Obesity 04/27/2014    Sallyanne Kuster, PTA, [facility] [address] [place], [place] [address] [phone] 09/29/16, 10:35 AM   Name: Claire Mcgrath MRN: [id number] Date of Birth: Nov 01, 1946

## 2016-10-02 ENCOUNTER — Encounter: Payer: Self-pay | Admitting: Physical Therapy

## 2016-10-02 ENCOUNTER — Ambulatory Visit: Payer: Medicare Other | Admitting: Physical Therapy

## 2016-10-02 DIAGNOSIS — R2681 Unsteadiness on feet: Secondary | ICD-10-CM

## 2016-10-02 DIAGNOSIS — R2689 Other abnormalities of gait and mobility: Secondary | ICD-10-CM

## 2016-10-02 DIAGNOSIS — I69351 Hemiplegia and hemiparesis following cerebral infarction affecting right dominant side: Secondary | ICD-10-CM | POA: Diagnosis not present

## 2016-10-02 NOTE — Patient Instructions (Signed)
Balance: Eyes Closed - Bilateral (Varied Surfaces)    Stand, feet shoulder width, close eyes. Maintain balance 30 seconds. Repeat 3 times per set. Do 1-2 sets per session. Do 5-7 sessions per week. Repeat on compliant surface: pillow  Copyright  VHI. All rights reserved.  Feet Apart, Head Motion - Eyes Closed    With eyes closed and feet shoulder width apart, move head slowly, up and down. Repeat ____ times per session. Do ____ sessions per day.  Copyright  VHI. All rights reserved.  Feet Together, Head Motion - Eyes Closed      With eyes closed and feet together, move head slowly, up and down. Repeat ____ times per session. Do ____ sessions per day.  Copyright  VHI. All rights reserved.  Feet Partial Heel-Toe, Head Motion - Eyes Closed    With eyes closed and right foot partially in front of the other, move head slowly, up and down. Repeat ____ times per session. Do ____ sessions per day.  Copyright  VHI. All rights reserved.

## 2016-10-02 NOTE — Therapy (Signed)
[facility] [address] [place], [place], [address] Phone: [phone]   Fax:  [phone]  Physical Therapy Treatment  Patient Details  Name: Claire Mcgrath MRN: [id number] Date of Birth: 1946-09-22 Referring Provider: Ralene Ok, MD (send to PCP, per pt request, as referral from hospitalist)  Encounter Date: 10/02/2016      PT End of Session - 10/02/16 1506    Visit Number 6   Number of Visits 17   Date for PT Re-Evaluation 11/14/16   Authorization Type [employer] Medicare   Authorization Time Period G-Code every 10th visit   PT Start Time 1100   PT Stop Time 1145   PT Time Calculation (min) 45 min   Equipment Utilized During Treatment Gait belt   Activity Tolerance Patient tolerated treatment well   Behavior During Therapy [facility] for tasks assessed/performed      Past Medical History:  Diagnosis Date  . Asthma   . CHF (congestive heart failure) (HCC)   . Diabetes mellitus without complication (HCC)   . Heart block   . Hyperlipidemia   . Hypertension     Past Surgical History:  Procedure Laterality Date  . ABDOMINAL HYSTERECTOMY    . BREAST LUMPECTOMY     left  . EP IMPLANTABLE DEVICE    . PACEMAKER INSERTION      There were no vitals filed for this visit.      Subjective Assessment - 10/02/16 1104    Subjective Pt reports she has been doing a lot of household activities including cleaning, cooking, and doing dishes, She reports no issues or falls since last visit.   Limitations Walking;Standing;House hold activities   Patient Stated Goals "Get back to doing the things I used to do before the stroke."   Currently in Pain? No/denies   Multiple Pain Sites No                         [facility] Adult PT Treatment/Exercise - 10/02/16 1100      Transfers   Transfers Sit to Stand;Stand to [employer] Transfers   Sit to Stand 6: Modified independent (Device/Increase time);With upper  extremity assist;From bed   Stand to Sit 6: Modified independent (Device/Increase time);With upper extremity assist;To bed   Stand Pivot Transfers 6: Modified independent (Device/Increase time)     Ambulation/Gait   Ambulation/Gait Yes   Ambulation/Gait Assistance 5: Supervision   Ambulation/Gait Assistance Details Verbal cuing needed to incr B arm swing.   Ambulation Distance (Feet) 120 Feet  2x120', 1x115'   Assistive device Straight cane;None   Gait Pattern Step-through pattern;Decreased arm swing - right;Decreased arm swing - left;Decreased stride length;Decreased hip/knee flexion - right;Trunk flexed;Narrow base of support   Ambulation Surface Level;Indoor   Stairs Yes   Stairs Assistance 5: Supervision   Stairs Assistance Details (indicate cue type and reason) Verbal cuing to contol decent by slowing down when descending stairs and prevent uncontrolled motions.   Stair Management Technique One rail Left;One rail Right  L going up; R going down   Number of Stairs 16   Height of Stairs 6   Ramp 5: Supervision   Curb 5: Supervision   Curb Details (indicate cue type and reason) Verbal cuing for sequencing with correct LE placement             Balance Exercises - 10/02/16 1459      Balance Exercises: Standing   Balance Beam Pt performed vertical,  horizontal, and diagonal head turns with eyes open and eyes closed while on foam balance beam.  She also performed ant/post alternating step downs to improve dynamic balance.  Pt required minA and intermittent UE suppor when experiencing LOB.   Step Over Hurdles / Cones Pt performed 4 laps each down and back stepping over 4 hurdles while forward walking and side stepping.  Verbal cuing needed for sequencing and foot placement as well as to encourage pt to incr hip/knee flex in order to fully clear hurdles.           PT Education - 10/02/16 1504    Education provided Yes   Education Details Pt educated on future POC including  assessment of STG/LTGs and possible d/c next week.   Person(s) Educated Patient   Methods Explanation   Comprehension Verbalized understanding          PT Short Term Goals - 09/15/16 1639      PT SHORT TERM GOAL #1   Title Pt will be independent and verbalize understanding of initial HEP to continue progress in therapy. (Target Date for all STGs: 10/13/16)   Time 4   Period Weeks   Status New     PT SHORT TERM GOAL #2   Title Pt will improve Berg Balance score to > or = 32/56 to indicate decr risk of falls.   Time 4   Period Weeks   Status New     PT SHORT TERM GOAL #3   Title Pt will improve TUG to < or = 24 sec to indicate decreased risk of falls.   Time 4   Period Weeks   Status New     PT SHORT TERM GOAL #4   Title Pt will improve gait speed to > or = 1.5 ft/sec to indicate incr in functional mobility and decr risk of falls.   Time 4   Period Weeks   Status New     PT SHORT TERM GOAL #5   Title Pt will ambulate 52500ft with supervision and LRAD indoors over level surfaces and ramps to indicate incr functional mobility and incr safety when ambulating at home.   Time 4   Period Weeks   Status New           PT Long Term Goals - 09/15/16 1644      PT LONG TERM GOAL #1   Title Pt will be independent and verbalize understanding with HEP and on-going fitnees program to maintain progress gained in therapy and improve overall health.   Time 8   Period Weeks   Status New     PT LONG TERM GOAL #2   Title Pt will improve Berg Balance score to > or = 40/56 to indicate a decr risk for falls.   Time 8   Period Weeks   Status New     PT LONG TERM GOAL #3   Title Pt will improve TUG to < or = 20 sec to indicate decr risk of falls.   Time 8   Period Weeks   Status New     PT LONG TERM GOAL #4   Title Pt will incr gait speed to >/= 1.8 ft/sec to indicate incr functional mobility ad decr risk of falls.   Time 8   Period Weeks   Status New     PT LONG TERM GOAL #5    Title Pt will ambulate 103300ft mod I with LRAD outdoors over level and unlevel surfaces, grass,  gravel, ramps, and curbs to incr functional mobility and allow her to ambulate safely in the community and church.   Time 8   Period Weeks   Status New               Plan - 10/02/16 1507    Clinical Impression Statement Pt continues to improve each session and has made significant gains toward her goals.  She states that she feels like she is close to her prior level of function and that her only real concern is minor balance issues.  During the session she performed well and was able to tolerate higher level balance activities designed to challenge her dynamic balance capabilities.  She continues to ambulate at home without an AD while using her [facility] when in the community.  PT and pt agreed to assess goals next week and determine if d/c would be appropriate at that time.  She would continue to benefit from skilled PT to address her remaining impairments and help transition pt toward d/c.   Rehab Potential Good   Clinical Impairments Affecting Rehab Potential Pacemaker, HTN, CHF, asthma,    PT Frequency 2x / week   PT Duration 8 weeks   PT Treatment/Interventions ADLs/Self Care Home Management;Functional mobility training;Stair training;Gait training;DME Instruction;Therapeutic activities;Therapeutic exercise;Balance training;Neuromuscular re-education;Patient/family education;Orthotic Fit/Training;Manual techniques;Energy conservation   PT Next Visit Plan work toward STGs for possible d/c next week; challenge dynamic gait and balance   Consulted and Agree with Plan of Care Patient      Patient will benefit from skilled therapeutic intervention in order to improve the following deficits and impairments:  Abnormal gait, Cardiopulmonary status limiting activity, Decreased activity tolerance, Decreased balance, Decreased mobility, Decreased knowledge of use of DME, Decreased endurance, Decreased  safety awareness, Decreased strength, Impaired perceived functional ability, Impaired flexibility, Improper body mechanics, Obesity, Pain  Visit Diagnosis: Hemiplegia and hemiparesis following cerebral infarction affecting right dominant side (HCC)  Unsteadiness on feet  Other abnormalities of gait and mobility     Problem List Patient Active Problem List   Diagnosis Date Noted  . Right sided weakness 09/04/2016  . Acute ischemic stroke (HCC) 09/04/2016  . DM2 (diabetes mellitus, type 2) (HCC) 09/04/2016  . HTN (hypertension) 09/04/2016  . Asthma 04/27/2014  . Hyperlipemia 04/27/2014  . CHF (congestive heart failure) (HCC) 04/27/2014  . Obesity 04/27/2014    Vilinda Flake, SPT 10/02/2016, 4:23 PM  [facility] [address] [place], [place], [address] Phone: [phone]   Fax:  [phone]  Name: Claire Mcgrath MRN: [id number] Date of Birth: October 23, 1946

## 2016-10-06 ENCOUNTER — Ambulatory Visit: Payer: Medicare Other | Admitting: Physical Therapy

## 2016-10-06 ENCOUNTER — Emergency Department (HOSPITAL_COMMUNITY)
Admission: EM | Admit: 2016-10-06 | Discharge: 2016-10-06 | Disposition: A | Discharge status: Home / Self Care | Payer: Medicare Other | Attending: Emergency Medicine | Admitting: Emergency Medicine

## 2016-10-06 ENCOUNTER — Encounter (HOSPITAL_COMMUNITY): Payer: Self-pay | Admitting: Emergency Medicine

## 2016-10-06 ENCOUNTER — Emergency Department (HOSPITAL_COMMUNITY): Payer: Medicare Other

## 2016-10-06 DIAGNOSIS — Z794 Long term (current) use of insulin: Secondary | ICD-10-CM | POA: Insufficient documentation

## 2016-10-06 DIAGNOSIS — R42 Dizziness and giddiness: Secondary | ICD-10-CM | POA: Diagnosis present

## 2016-10-06 DIAGNOSIS — Z950 Presence of cardiac pacemaker: Secondary | ICD-10-CM | POA: Diagnosis not present

## 2016-10-06 DIAGNOSIS — F17210 Nicotine dependence, cigarettes, uncomplicated: Secondary | ICD-10-CM | POA: Diagnosis not present

## 2016-10-06 DIAGNOSIS — E119 Type 2 diabetes mellitus without complications: Secondary | ICD-10-CM | POA: Insufficient documentation

## 2016-10-06 DIAGNOSIS — Z7984 Long term (current) use of oral hypoglycemic drugs: Secondary | ICD-10-CM | POA: Diagnosis not present

## 2016-10-06 DIAGNOSIS — J45909 Unspecified asthma, uncomplicated: Secondary | ICD-10-CM | POA: Insufficient documentation

## 2016-10-06 DIAGNOSIS — I110 Hypertensive heart disease with heart failure: Secondary | ICD-10-CM | POA: Insufficient documentation

## 2016-10-06 DIAGNOSIS — Z8673 Personal history of transient ischemic attack (TIA), and cerebral infarction without residual deficits: Secondary | ICD-10-CM | POA: Diagnosis not present

## 2016-10-06 DIAGNOSIS — I509 Heart failure, unspecified: Secondary | ICD-10-CM | POA: Insufficient documentation

## 2016-10-06 HISTORY — DX: Cerebral infarction, unspecified: I63.9

## 2016-10-06 LAB — URINALYSIS, ROUTINE W REFLEX MICROSCOPIC
Bilirubin Urine: NEGATIVE
GLUCOSE, UA: NEGATIVE mg/dL
HGB URINE DIPSTICK: NEGATIVE
Ketones, ur: NEGATIVE mg/dL
LEUKOCYTES UA: NEGATIVE
Nitrite: NEGATIVE
PH: 5.5 (ref 5.0–8.0)
PROTEIN: NEGATIVE mg/dL
SPECIFIC GRAVITY, URINE: 1.004 — AB (ref 1.005–1.030)

## 2016-10-06 LAB — DIFFERENTIAL
BASOS PCT: 0 %
Basophils Absolute: 0 10*3/uL (ref 0.0–0.1)
Eosinophils Absolute: 0.2 10*3/uL (ref 0.0–0.7)
Eosinophils Relative: 2 %
LYMPHS ABS: 4 10*3/uL (ref 0.7–4.0)
Lymphocytes Relative: 35 %
MONO ABS: 0.6 10*3/uL (ref 0.1–1.0)
MONOS PCT: 5 %
NEUTROS ABS: 6.7 10*3/uL (ref 1.7–7.7)
Neutrophils Relative %: 58 %

## 2016-10-06 LAB — CBC
HEMATOCRIT: 35 % — AB (ref 36.0–46.0)
HEMOGLOBIN: 11.3 g/dL — AB (ref 12.0–15.0)
MCH: 27 pg (ref 26.0–34.0)
MCHC: 32.3 g/dL (ref 30.0–36.0)
MCV: 83.5 fL (ref 78.0–100.0)
Platelets: 292 10*3/uL (ref 150–400)
RBC: 4.19 MIL/uL (ref 3.87–5.11)
RDW: 15.1 % (ref 11.5–15.5)
WBC: 11.6 10*3/uL — AB (ref 4.0–10.5)

## 2016-10-06 LAB — BASIC METABOLIC PANEL
ANION GAP: 9 (ref 5–15)
BUN: 13 mg/dL (ref 6–20)
CHLORIDE: 106 mmol/L (ref 101–111)
CO2: 23 mmol/L (ref 22–32)
Calcium: 9.1 mg/dL (ref 8.9–10.3)
Creatinine, Ser: 0.94 mg/dL (ref 0.44–1.00)
GFR calc Af Amer: >60 mL/min (ref >60)
GFR calc non Af Amer: >60 mL/min (ref >60)
GLUCOSE: 129 mg/dL — AB (ref 65–99)
POTASSIUM: 4.3 mmol/L (ref 3.5–5.1)
Sodium: 138 mmol/L (ref 135–145)

## 2016-10-06 MED ORDER — MECLIZINE HCL 25 MG PO TABS: 25.0000 mg | ORAL_TABLET | Freq: Two times a day (BID) | ORAL | 0 refills | Status: DC | PRN

## 2016-10-06 MED ORDER — MECLIZINE HCL 25 MG PO TABS
25.0000 mg | ORAL_TABLET | Freq: Once | ORAL | Status: AC
  Administered 2016-10-06: 25 mg via ORAL
  Filled 2016-10-06: qty 1

## 2016-10-06 NOTE — ED Notes (Signed)
Pt attempted to ambulate but upon rising became dizzy. Pt placed back into bed and bedpan used.

## 2016-10-06 NOTE — ED Triage Notes (Signed)
Pt brought in [facility] from Home with c/o dizziness worse when laying down, after feeling generally unwell for the last few days. Pt has a hx of hemorrhagic stroke 9/25. Per EMS she had a headache on the right parietal area but has progressively started feeling better. Stroke screen negative per EMS.

## 2016-10-06 NOTE — ED Provider Notes (Signed)
[facility]-EMERGENCY DEPT Provider Note   CSN: [id number] Arrival date & time: 10/06/16  0249     History   Chief Complaint Chief Complaint  Patient presents with  . Dizziness  . Headache    HPI Claire Mcgrath is a 70 y.o. female.  The history is provided by the patient.  Dizziness  Quality:  Room spinning Severity:  Moderate Onset quality:  Sudden Timing:  Intermittent Progression:  Waxing and waning Chronicity:  New Context: head movement   Relieved by: rest. Worsened by:  Movement Associated symptoms: no chest pain, no hearing loss, no tinnitus, no vision changes and no weakness   Risk factors: hx of stroke   Patient with h/o stroke presents with "spinning" vertigo like symptoms She has felt "unwell" for past day and after dinner last evening she had onset of vertigo.  It is worse with head movement No visual changes No new/focal weakness No cp/sob  Pt reports mild HA that is now resolving Past Medical History:  Diagnosis Date  . Asthma   . CHF (congestive heart failure) (HCC)   . Diabetes mellitus without complication (HCC)   . Heart block   . Hyperlipidemia   . Hypertension   . Stroke [facility])     Patient Active Problem List   Diagnosis Date Noted  . Right sided weakness 09/04/2016  . Acute ischemic stroke (HCC) 09/04/2016  . DM2 (diabetes mellitus, type 2) (HCC) 09/04/2016  . HTN (hypertension) 09/04/2016  . Asthma 04/27/2014  . Hyperlipemia 04/27/2014  . CHF (congestive heart failure) (HCC) 04/27/2014  . Obesity 04/27/2014    Past Surgical History:  Procedure Laterality Date  . ABDOMINAL HYSTERECTOMY    . BREAST LUMPECTOMY     left  . EP IMPLANTABLE DEVICE    . PACEMAKER INSERTION      OB History    No data available       Home Medications    Prior to Admission medications   Medication Sig Start Date End Date Taking? Authorizing Provider  clopidogrel (PLAVIX) 75 MG tablet Take 1 tablet (75 mg total) by mouth daily. 09/07/16  Yes Tyrone Nine, MD  fluticasone furoate-vilanterol (BREO ELLIPTA) 100-25 MCG/INH AEPB Inhale 1 puff into the lungs daily as needed.   Yes Historical Provider, MD  Insulin Lispro Prot & Lispro (HUMALOG MIX 75/25 KWIKPEN) (75-25) 100 UNIT/ML Kwikpen Inject 20 Units into the skin 2 (two) times daily.   Yes Historical Provider, MD  Liraglutide (VICTOZA) 18 MG/3ML SOPN Inject 1.2 mg into the skin every morning.   Yes Historical Provider, MD  lisinopril-hydrochlorothiazide (PRINZIDE,ZESTORETIC) 10-12.5 MG tablet Take 1 tablet by mouth at bedtime.   Yes Historical Provider, MD  metFORMIN (GLUCOPHAGE) 1000 MG tablet Take 1,000 mg by mouth at bedtime.   Yes Historical Provider, MD  omeprazole (PRILOSEC) 40 MG capsule Take 40 mg by mouth at bedtime. 01/02/14  Yes Historical Provider, MD  pravastatin (PRAVACHOL) 20 MG tablet Take 10 mg by mouth at bedtime.   Yes Historical Provider, MD    Family History Family History  Problem Relation Age of Onset  . Congestive Heart Failure Mother   . Heart attack Father   . Stroke Maternal Aunt     Social History Social History  Substance Use Topics  . Smoking status: Current Every Day Smoker    Packs/day: 1.00    Types: Cigarettes  . Smokeless tobacco: Never Used  . Alcohol use Yes     Comment: occassional  Allergies   Kiwi extract; Iodine; and Shrimp [shellfish allergy]   Review of Systems Review of Systems  Constitutional: Negative for fever.  HENT: Negative for hearing loss and tinnitus.   Cardiovascular: Negative for chest pain.  Neurological: Positive for dizziness. Negative for weakness.  All other systems reviewed and are negative.    Physical Exam Updated Vital Signs BP 147/55   Pulse (!) 59   Temp 98 F (36.7 C) (Oral)   Resp 15   Ht 5\' 4"  (1.626 m)   Wt 101.2 kg   SpO2 97%   BMI 38.28 kg/m   Physical Exam CONSTITUTIONAL: Well developed/well nourished HEAD: Normocephalic/atraumatic EYES: EOMI/PERRL, no nystagmus, no  ptosis ENMT: Mucous membranes moist, bilateral TM clear/intact NECK: supple no meningeal signs CV: S1/S2 noted, no murmurs/rubs/gallops noted LUNGS: Lungs are clear to auscultation bilaterally, no apparent distress ABDOMEN: soft, nontender, no rebound or guarding GU:no cva tenderness NEURO:Awake/alert, face symmetric, no arm drift is noted Mild drift to right LE (chronic per patient) Cranial nerves 3/4/5/6/06/25/09/11/12 tested and intact No past pointing Sensation to light touch intact in all extremities EXTREMITIES: pulses normal, full ROM SKIN: warm, color normal PSYCH: no abnormalities of mood noted    ED Treatments / Results  Labs (all labs ordered are listed, but only abnormal results are displayed) Labs Reviewed  CBC - Abnormal; Notable for the following:       Result Value   WBC 11.6 (*)    Hemoglobin 11.3 (*)    HCT 35.0 (*)    All other components within normal limits  URINALYSIS, ROUTINE W REFLEX MICROSCOPIC (NOT AT [facility]) - Abnormal; Notable for the following:    APPearance CLOUDY (*)    Specific Gravity, Urine 1.004 (*)    All other components within normal limits  BASIC METABOLIC PANEL - Abnormal; Notable for the following:    Glucose, Bld 129 (*)    All other components within normal limits  DIFFERENTIAL    EKG  EKG Interpretation  Date/Time:  Friday October 06 2016 03:11:56 EDT Ventricular Rate:  60 PR Interval:    QRS Duration: 157 QT Interval:  454 QTC Calculation: 454 R Axis:   -69 Text Interpretation:  Ventricular-paced rhythm No further analysis attempted due to paced rhythm Confirmed by Bebe Shaggy  MD, Jaycelyn Orrison ([id number]) on 10/06/2016 3:25:21 AM       Radiology Ct Head Wo Contrast  Result Date: 10/06/2016 CLINICAL DATA:  Initial evaluation for acute dizziness. History of recent stroke. EXAM: CT HEAD WITHOUT CONTRAST TECHNIQUE: Contiguous axial images were obtained from the base of the skull through the vertex without intravenous contrast.  COMPARISON:  Prior CT from 09/05/2016. FINDINGS: Brain: Cerebral volume within normal limits for age. Mild chronic microvascular ischemic disease. No acute intracranial hemorrhage. No evidence for acute large vessel territory infarct. No mass lesion, midline shift, or mass effect. No hydrocephalus. No extra-axial fluid collection. Vascular: No hyperdense vessel. Skull: Scalp soft tissues within normal limits.  Calvarium intact. Sinuses/Orbits: Globes and orbital soft tissues within normal limits. Patient is status post lens extraction bilaterally. Paranasal sinuses and mastoid air cells are clear. IMPRESSION: 1. No acute intracranial process. 2. Mild chronic microvascular ischemic disease. Electronically Signed   By: Rise Mu M.D.   On: 10/06/2016 06:07    Procedures Procedures   Medications Ordered in ED Medications  meclizine (ANTIVERT) tablet 25 mg (25 mg Oral Given 10/06/16 0545)     Initial Impression / Assessment and Plan / ED Course  I have  reviewed the triage vital signs and the nursing notes.  Pertinent labs  results that were available during my care of the patient were reviewed by me and considered in my medical decision making (see chart for details).  Clinical Course    6:05 AM Pt with h/o stroke in 08/2016 and she reports residual weakness in right LE She now has vertigo like syndrome No signs of acute stroke at this time Imaging/labs pending at this time 7:37 AM Pt is improved She is awake/alert. No distress No new weakness She was re-ambulated and felt improved.  No ataxia.  No dizziness upon ambulation Due to pacemaker, she would be unable to have MRI but since her symptoms resolved, my suspicion for occult CVA is low She feels comfortable with d/c home Will send home on antivert, and warned about drowsiness We discussed strict ER return precautions   Final Clinical Impressions(s) / ED Diagnoses   Final diagnoses:  Vertigo    New  Prescriptions New Prescriptions   MECLIZINE (ANTIVERT) 25 MG TABLET    Take 1 tablet (25 mg total) by mouth 2 (two) times daily as needed for dizziness.     Zadie Rhineonald Tellis Spivak, MD 10/06/16 [phone]

## 2016-10-06 NOTE — Discharge Instructions (Signed)

## 2016-10-06 NOTE — ED Notes (Signed)
Pt ambulated approx 650ft in hallway with walker and back to room unassisted with no reports of dizziness.

## 2016-10-09 ENCOUNTER — Encounter: Payer: Self-pay | Admitting: Physical Therapy

## 2016-10-09 ENCOUNTER — Ambulatory Visit: Payer: Medicare Other | Admitting: Physical Therapy

## 2016-10-09 DIAGNOSIS — R2681 Unsteadiness on feet: Secondary | ICD-10-CM

## 2016-10-09 DIAGNOSIS — I69351 Hemiplegia and hemiparesis following cerebral infarction affecting right dominant side: Secondary | ICD-10-CM

## 2016-10-09 DIAGNOSIS — R2689 Other abnormalities of gait and mobility: Secondary | ICD-10-CM

## 2016-10-09 DIAGNOSIS — M6281 Muscle weakness (generalized): Secondary | ICD-10-CM

## 2016-10-09 NOTE — Therapy (Signed)
[facility] [address] [place], [place], [address] Phone: [phone]   Fax:  [phone]  Physical Therapy Treatment  Patient Details  Name: Claire Mcgrath MRN: [id number] Date of Birth: 1946-12-15 Referring Provider: Jilda Panda, MD (send to PCP, per pt request, as referral from hospitalist)  Encounter Date: 10/09/2016      PT End of Session - 10/09/16 1421    Visit Number 7   Number of Visits 17   Date for PT Re-Evaluation 11/14/16   Authorization Type [employer] Medicare   Authorization Time Period G-Code every 10th visit   PT Start Time 1317   PT Stop Time 1404   PT Time Calculation (min) 47 min   Activity Tolerance Patient tolerated treatment well   Behavior During Therapy [facility] for tasks assessed/performed      Past Medical History:  Diagnosis Date  . Asthma   . CHF (congestive heart failure) ([place])   . Diabetes mellitus without complication ([place])   . Heart block   . Hyperlipidemia   . Hypertension   . Stroke [facility])     Past Surgical History:  Procedure Laterality Date  . ABDOMINAL HYSTERECTOMY    . BREAST LUMPECTOMY     left  . EP IMPLANTABLE DEVICE    . PACEMAKER INSERTION      There were no vitals filed for this visit.      Subjective Assessment - 10/09/16 1314    Subjective Pt reports being dizzy last Friday with her BP being "very high" and needd to call EMS.  She was in the hospital Thurs night through Fri morning stating that the doctor told her she had vertigo.   Limitations Walking;Standing;House hold activities   Patient Stated Goals "Get back to doing the things I used to do before the stroke."   Currently in Pain? No/denies   Multiple Pain Sites No                         [facility] Adult PT Treatment/Exercise - 10/09/16 1315      Transfers   Transfers Sit to Stand;Stand to [employer] Transfers   Sit to Stand 7: Independent   Stand to Sit 7: Independent   Stand  Pivot Transfers 7: Independent     Ambulation/Gait   Ambulation/Gait Yes   Ambulation/Gait Assistance 7: Independent   Ambulation Distance (Feet) 1000 Feet  1x1000', 2x100', 1x20'   Assistive device None   Gait Pattern Within Functional Limits   Ambulation Surface Level;Unlevel;Indoor;Outdoor;Paved;Grass;Gravel   Gait velocity 3.27 ft/ sec  Indicates community ambulator   Ramp 7: Independent   Curb 7: Independent     Standardized Balance Assessment   Standardized Balance Assessment Berg Balance Test;Timed Up and Go Test     Berg Balance Test   Sit to Stand Able to stand without using hands and stabilize independently   Standing Unsupported Able to stand safely 2 minutes   Sitting with Back Unsupported but Feet Supported on Floor or Stool Able to sit safely and securely 2 minutes   Stand to Sit Sits safely with minimal use of hands   Transfers Able to transfer safely, minor use of hands   Standing Unsupported with Eyes Closed Able to stand 10 seconds safely   Standing Ubsupported with Feet Together Able to place feet together independently and stand 1 minute safely   From Standing, Reach Forward with Outstretched Arm Can reach confidently >25 cm (10")  From Standing Position, Pick up Object from [place] to pick up shoe safely and easily   From Standing Position, Turn to Look Behind Over each Shoulder Looks behind from both sides and weight shifts well   Turn 360 Degrees Able to turn 360 degrees safely in 4 seconds or less   Standing Unsupported, Alternately Place Feet on Step/Stool Able to stand independently and safely and complete 8 steps in 20 seconds   Standing Unsupported, One Foot in [place] to place foot tandem independently and hold 30 seconds   Standing on One Leg Able to lift leg independently and hold 5-10 seconds   Total Score 55     Timed Up and Go Test   TUG Normal TUG   Normal TUG (seconds) 12.5                PT Education - 10/09/16 1419     Education provided Yes   Education Details Pt educated on recognizing signs and symptoms of a stroke and initiate exercise routine via [employer].   Person(s) Educated Patient   Methods Explanation   Comprehension Verbalized understanding          PT Short Term Goals - 10/09/16 1626      PT SHORT TERM GOAL #1   Title Pt will be independent and verbalize understanding of initial HEP to continue progress in therapy. (Target Date for all STGs: 10/13/16)   Time 4   Period Weeks   Status Achieved     PT SHORT TERM GOAL #2   Title Pt will improve Berg Balance score to > or = 32/56 to indicate decr risk of falls.   Baseline 10/23: Merrilee Jansky = 55/56   Time 4   Period Weeks   Status Achieved     PT SHORT TERM GOAL #3   Title Pt will improve TUG to < or = 24 sec to indicate decreased risk of falls.   Baseline 10/23: TUG = 12.5 without AD   Time 4   Period Weeks   Status Achieved     PT SHORT TERM GOAL #4   Title Pt will improve gait speed to > or = 1.5 ft/sec to indicate incr in functional mobility and decr risk of falls.   Baseline 10/23: gait velocity = 3.27 ft/sec without AD   Time 4   Period Weeks   Status Achieved     PT SHORT TERM GOAL #5   Title Pt will ambulate 51f with supervision and LRAD indoors over level surfaces and ramps to indicate incr functional mobility and incr safety when ambulating at home.   Baseline Met 10/23.   Time 4   Period Weeks   Status Achieved           PT Long Term Goals - 10/09/16 1408      PT LONG TERM GOAL #1   Title Pt will be independent and verbalize understanding with HEP and on-going fitnees program to maintain progress gained in therapy and improve overall health.   Baseline GOAL MET: 10/09/16   Time 8   Period Weeks   Status Achieved     PT LONG TERM GOAL #2   Title Pt will improve Berg Balance score to > or = 40/56 to indicate a decr risk for falls.   Baseline GOAL MET: 10/09/16   Time 8   Period Weeks    Status Achieved     PT LONG TERM GOAL #3   Title Pt  will improve TUG to < or = 20 sec to indicate decr risk of falls.   Baseline GOAL MET: 10-15-2016   Time 8   Period Weeks   Status Achieved     PT LONG TERM GOAL #4   Title Pt will incr gait speed to >/= 1.8 ft/sec to indicate incr functional mobility ad decr risk of falls.   Baseline GOAL MET: 10-15-2016   Time 8   Period Weeks   Status Achieved     PT LONG TERM GOAL #5   Title Pt will ambulate [age]f mod I with LRAD outdoors over level and unlevel surfaces, grass, gravel, ramps, and curbs to incr functional mobility and allow her to ambulate safely in the community and church.   Baseline GOAL MET: 129-Oct-2017  Time 8   Period Weeks   Status Achieved               Plan - 1October 29, 20171422    Clinical Impression Statement Pt was able to meet all LTGs during session and verbalized satisfaction with progress made in therapy stating that she is able to do all ADLs as she was before her stroke.  Pt understands the signs and symptoms of a stroke and what to do if experiencing stroke related symptoms.  Due to her ability to achieve all her LTGs she will be d/c from therapy after today's session.   Rehab Potential Good   Clinical Impairments Affecting Rehab Potential Pacemaker, HTN, CHF, asthma,    PT Frequency 2x / week   PT Duration 8 weeks   PT Treatment/Interventions ADLs/Self Care Home Management;Functional mobility training;Stair training;Gait training;DME Instruction;Therapeutic activities;Therapeutic exercise;Balance training;Neuromuscular re-education;Patient/family education;Orthotic Fit/Training;Manual techniques;Energy conservation   Consulted and Agree with Plan of Care Patient      Patient will benefit from skilled therapeutic intervention in order to improve the following deficits and impairments:  Abnormal gait, Cardiopulmonary status limiting activity, Decreased activity tolerance, Decreased balance, Decreased mobility,  Decreased knowledge of use of DME, Decreased endurance, Decreased safety awareness, Decreased strength, Impaired perceived functional ability, Impaired flexibility, Improper body mechanics, Obesity, Pain  Visit Diagnosis: Hemiplegia and hemiparesis following cerebral infarction affecting right dominant side (HCC)  Unsteadiness on feet  Other abnormalities of gait and mobility  Muscle weakness (generalized)       G-Codes - 1Oct 29, 20171625    Functional Assessment Tool Used TUG = 12.5 sec without assistive device; Berg = 55/56; gait velocity = 3.27 ft/sec   Functional Limitation Mobility: Walking and moving around   Mobility: Walking and Moving Around Goal Status [phone] At least 20 percent but less than 40 percent impaired, limited or restricted   Mobility: Walking and Moving Around Discharge Status [phone] At least 1 percent but less than 20 percent impaired, limited or restricted      Problem List Patient Active Problem List   Diagnosis Date Noted  . Right sided weakness 09/04/2016  . Acute ischemic stroke ([place] 09/04/2016  . DM2 (diabetes mellitus, type 2) [place] 09/04/2016  . HTN (hypertension) 09/04/2016  . Asthma 04/27/2014  . Hyperlipemia 04/27/2014  . CHF ([place]estive heart failure) ([place] 04/27/2014  . Obesity 04/27/2014   PHYSICAL THERAPY DISCHARGE SUMMARY  Visits from Start of Care: 7  Current functional level related to goals / functional outcomes: See above   Remaining deficits: See above   Education / Equipment: See above  Plan: Patient agrees to discharge.  Patient goals were met. Patient is being discharged due to meeting the stated rehab goals.  ?????  Katherine Mantle , SPT 10/09/2016, 5:04 PM  [facility] [address] [place], [place], [address] Phone: [phone]   Fax:  [phone]  Name: Claire Mcgrath MRN: [id number] Date of Birth: [birth year]

## 2016-10-13 ENCOUNTER — Ambulatory Visit: Payer: Medicare Other | Admitting: Physical Therapy

## 2016-10-16 ENCOUNTER — Ambulatory Visit: Payer: Medicare Other | Admitting: Physical Therapy

## 2016-10-20 ENCOUNTER — Ambulatory Visit: Payer: Medicare Other | Admitting: Physical Therapy

## 2016-10-23 ENCOUNTER — Ambulatory Visit: Payer: Medicare Other | Admitting: Physical Therapy

## 2016-10-27 ENCOUNTER — Ambulatory Visit: Payer: Medicare Other | Admitting: Physical Therapy

## 2016-10-30 ENCOUNTER — Ambulatory Visit: Payer: Medicare Other | Admitting: Physical Therapy

## 2016-11-03 ENCOUNTER — Ambulatory Visit: Payer: Medicare Other | Admitting: Physical Therapy

## 2016-11-06 ENCOUNTER — Ambulatory Visit (INDEPENDENT_AMBULATORY_CARE_PROVIDER_SITE_OTHER): Payer: Medicare Other | Admitting: Neurology

## 2016-11-06 ENCOUNTER — Encounter: Payer: Self-pay | Admitting: Neurology

## 2016-11-06 ENCOUNTER — Ambulatory Visit: Payer: Medicare Other | Admitting: Physical Therapy

## 2016-11-06 VITALS — BP 119/53 | HR 60 | Ht 64.0 in | Wt 212.6 lb

## 2016-11-06 DIAGNOSIS — I639 Cerebral infarction, unspecified: Secondary | ICD-10-CM

## 2016-11-06 DIAGNOSIS — I6381 Other cerebral infarction due to occlusion or stenosis of small artery: Secondary | ICD-10-CM

## 2016-11-06 NOTE — Patient Instructions (Addendum)
Stroke Prevention Some medical conditions and behaviors are associated with an increased chance of having a stroke. You may prevent a stroke by making healthy choices and managing medical conditions. How can I reduce my risk of having a stroke?  Stay physically active. Get at least 30 minutes of activity on most or all days.  Do not smoke. It may also be helpful to avoid exposure to secondhand smoke.  Limit alcohol use. Moderate alcohol use is considered to be:  No more than 2 drinks per day for men.  No more than 1 drink per day for nonpregnant women.  Eat healthy foods. This involves:  Eating 5 or more servings of fruits and vegetables a day.  Making dietary changes that address high blood pressure (hypertension), high cholesterol, diabetes, or obesity.  Manage your cholesterol levels.  Making food choices that are high in fiber and low in saturated fat, trans fat, and cholesterol may control cholesterol levels.  Take any prescribed medicines to control cholesterol as directed by your health care provider.  Manage your diabetes.  Controlling your carbohydrate and sugar intake is recommended to manage diabetes.  Take any prescribed medicines to control diabetes as directed by your health care provider.  Control your hypertension.  Making food choices that are low in salt (sodium), saturated fat, trans fat, and cholesterol is recommended to manage hypertension.  Ask your health care provider if you need treatment to lower your blood pressure. Take any prescribed medicines to control hypertension as directed by your health care provider.  If you are 18-39 years of age, have your blood pressure checked every 3-5 years. If you are 40 years of age or older, have your blood pressure checked every year.  Maintain a healthy weight.  Reducing calorie intake and making food choices that are low in sodium, saturated fat, trans fat, and cholesterol are recommended to manage  weight.  Stop drug abuse.  Avoid taking birth control pills.  Talk to your health care provider about the risks of taking birth control pills if you are over 35 years old, smoke, get migraines, or have ever had a blood clot.  Get evaluated for sleep disorders (sleep apnea).  Talk to your health care provider about getting a sleep evaluation if you snore a lot or have excessive sleepiness.  Take medicines only as directed by your health care provider.  For some people, aspirin or blood thinners (anticoagulants) are helpful in reducing the risk of forming abnormal blood clots that can lead to stroke. If you have the irregular heart rhythm of atrial fibrillation, you should be on a blood thinner unless there is a good reason you cannot take them.  Understand all your medicine instructions.  Make sure that other conditions (such as anemia or atherosclerosis) are addressed. Get help right away if:  You have sudden weakness or numbness of the face, arm, or leg, especially on one side of the body.  Your face or eyelid droops to one side.  You have sudden confusion.  You have trouble speaking (aphasia) or understanding.  You have sudden trouble seeing in one or both eyes.  You have sudden trouble walking.  You have dizziness.  You have a loss of balance or coordination.  You have a sudden, severe headache with no known cause.  You have new chest pain or an irregular heartbeat. Any of these symptoms may represent a serious problem that is an emergency. Do not wait to see if the symptoms will go away.   Get medical help at once. Call your local emergency services (911 in [place] Do not drive yourself to the hospital. This information is not intended to replace advice given to you by your health care provider. Make sure you discuss any questions you have with your health care provider. Document Released: 01/11/2005 Document Revised: 05/11/2016 Document Reviewed: 06/06/2013 Elsevier  Interactive Patient Education  2017 Elsevier Inc.  

## 2016-11-06 NOTE — Progress Notes (Signed)
[facility] [address] [place]. [place] [address] [phone]       OFFICE FOLLOW-UP NOTE  Claire. Claire Mcgrath Date of Birth:  1946/07/17 Medical Record Number:  [id number]   HPI: Claire Mcgrath is a 49 year Caucasian lady seen today for first office follow-up visit following hospital admission for stroke in September 2017. Claire Lennon Rippyis an 70 y.o.femalehistory diabetes mellitus, hypertension, hyperlipidemia, asthma and CHF, presenting with weakness involving lower extremities, right greater than left as well as right upper extremity weakness. Lower extremity symptoms were first noticed 2 days ago. She noticed she had particular problems with her right lower extremity and to a lesser extent left upper extremity. One day prior to admission she noticed weakness of her right lower extremity with dropping objects as well as difficulty with writing. She had no change in speech. No facial droop is been reported. She has no previous history of stroke nor TIA. She takes aspirin daily. 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 She was LKW on 09/02/2016, time unknown. Patient was not administered IV t-PA secondary to  Minimal deficits; beyond time window for treatment consideration. She was admitted  for further evaluation and treatment. She had a follow-up repeat CT scan the next day which did not show significant change in the infarct appearance or any additional infarct. CT angiogram of the brain and neck were both performed did not show significant large vessel stenosis or occlusion. LDL cholesterol was 77 mg percent and hemoglobin A1c was elevated at 9.1. Transthoracic echo showed normal ejection fraction without cardiac source of embolism. Patient was seen by physical and occupation therapy and aspirin was changed to Plavix. She stretches tolerating Plavix well without bleeding or bruising but feels her taste is altered. This seems to have  improved slightly off late. She is getting physical and occupation therapy that she has recently finished. Her walking and balance are much better. She still feels at times not sure of her balance but she has had no falls or injuries. She states her blood pressure is well controlled and today it is 119/53. She is working on better sugar control with a primary care physician. She does have some numbness in the right hand but she feels this is old from carpal tunnel. She has no new complaints today. She is eating healthy and has lost 10 pounds. She plans to join the [facility] soon and be more active. 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 CT scan of the head showed no acute abnormality. Her blood pressure was elevated during this visit. Should she describes no recurrent vertigo episodes.  ROS:   14 system review of systems is positive for  allergies only and all systems negative PMH:  Past Medical History:  Diagnosis Date  . Asthma   . CHF (congestive heart failure) (HCC)   . Diabetes mellitus without complication (HCC)   . Heart block   . Hyperlipidemia   . Hypertension   . Stroke [facility])     Social History:  Social History   Social History  . Marital status: Married    Spouse name: N/A  . Number of children: N/A  . Years of education: N/A   Occupational History  . Not on file.   Social History Main Topics  . Smoking status: Current Every Day Smoker    Packs/day: 0.50  Types: Cigarettes  . Smokeless tobacco: Never Used  . Alcohol use Yes     Comment: occassional  . Drug use: No  . Sexual activity: Not on file   Other Topics Concern  . Not on file   Social History Narrative  . No narrative on file    Medications:   Current Outpatient Prescriptions on File Prior to Visit  Medication Sig Dispense Refill  . clopidogrel (PLAVIX) 75 MG tablet Take 1 tablet (75 mg total) by mouth  daily. 30 tablet 0  . fluticasone furoate-vilanterol (BREO ELLIPTA) 100-25 MCG/INH AEPB Inhale 1 puff into the lungs daily as needed.    . Insulin Lispro Prot & Lispro (HUMALOG MIX 75/25 KWIKPEN) (75-25) 100 UNIT/ML Kwikpen Inject 20 Units into the skin 2 (two) times daily.    . Liraglutide (VICTOZA) 18 MG/3ML SOPN Inject 1.2 mg into the skin every morning.    Marland Kitchen. lisinopril-hydrochlorothiazide (PRINZIDE,ZESTORETIC) 10-12.5 MG tablet Take 1 tablet by mouth at bedtime.    . meclizine (ANTIVERT) 25 MG tablet Take 1 tablet (25 mg total) by mouth 2 (two) times daily as needed for dizziness. 15 tablet 0  . metFORMIN (GLUCOPHAGE) 1000 MG tablet Take 1,000 mg by mouth at bedtime.    Marland Kitchen. omeprazole (PRILOSEC) 40 MG capsule Take 40 mg by mouth at bedtime.    . pravastatin (PRAVACHOL) 20 MG tablet Take 10 mg by mouth at bedtime.     No current facility-administered medications on file prior to visit.     Allergies:   Allergies  Allergen Reactions  . Kiwi Extract Anaphylaxis    Feel my throat close up  . Iodine Itching and Swelling       . Shrimp [Shellfish Allergy] Itching and Swelling    Physical Exam General: well developed, well nourished, elderly Caucasian lady seated, in no evident distress Head: head normocephalic and atraumatic.  Neck: supple with no carotid or supraclavicular bruits Cardiovascular: regular rate and rhythm, no murmurs Musculoskeletal: no deformity Skin:  no rash/petichiae Vascular:  Normal pulses all extremities Vitals:   11/06/16 1457  BP: (!) 119/53  Pulse: 60   Neurologic Exam Mental Status: Awake and fully alert. Oriented to place and time. Recent and remote memory intact. Attention span, concentration and fund of knowledge appropriate. Mood and affect appropriate.  Cranial Nerves: Fundoscopic exam reveals sharp disc margins. Pupils equal, briskly reactive to light. Extraocular movements full without nystagmus. Visual fields full to confrontation. Hearing intact.  Facial sensation intact. Face, tongue, palate moves normally and symmetrically.  Motor: Normal bulk and tone. Normal strength in all tested extremity muscles. Sensory.: intact to touch ,pinprick .position and vibratory sensation.  Coordination: Rapid alternating movements normal in all extremities. Finger-to-nose and heel-to-shin performed accurately bilaterally. Gait and Station: Arises from chair without difficulty. Stance is normal. Gait demonstrates normal stride length and balance . Able to heel, toe and tandem walk with mild difficulty.  Reflexes: 1+ and symmetric. Toes downgoing.   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 Vascular risk factors of diabetes, hypertension, hyperlipidemia    PLAN: I had a long d/w patient about her recent lacunar stroke, risk for recurrent stroke/TIAs, personally independently reviewed imaging studies and stroke evaluation results and answered questions.Continue Plavix  for secondary stroke prevention and maintain strict control of hypertension with blood pressure goal below 130/90, diabetes with hemoglobin A1c goal below 6.5% and lipids with  LDL cholesterol goal below 70 mg/dL. I also advised the patient to eat a healthy diet with plenty of whole grains, cereals, fruits and vegetables, exercise regularly and maintain ideal body weight Followup in the future with my nurse practitioner in 6 months or call earlier if necessary. Greater than 50% of time during this 25 minute visit was spent on counseling,explanation of diagnosis, planning of further management, discussion with patient and family and coordination of care Delia HeadyPramod Tarissa Kerin, MD  [facility] [address] [place], [place] [id number]  Phone [phone] Fax [phone] Note: This document was prepared with digital dictation and possible smart phrase technology. Any  transcriptional errors that result from this process are unintentional

## 2016-11-08 ENCOUNTER — Ambulatory Visit: Payer: Medicare Other | Admitting: Physical Therapy

## 2016-12-01 ENCOUNTER — Encounter (HOSPITAL_COMMUNITY): Payer: Self-pay

## 2016-12-01 ENCOUNTER — Ambulatory Visit (HOSPITAL_COMMUNITY)
Admission: EM | Admit: 2016-12-01 | Discharge: 2016-12-01 | Disposition: A | Discharge status: Home / Self Care | Payer: Medicare Other | Attending: Internal Medicine | Admitting: Internal Medicine

## 2016-12-01 DIAGNOSIS — J40 Bronchitis, not specified as acute or chronic: Secondary | ICD-10-CM

## 2016-12-01 DIAGNOSIS — J4521 Mild intermittent asthma with (acute) exacerbation: Secondary | ICD-10-CM

## 2016-12-01 MED ORDER — METHYLPREDNISOLONE 4 MG PO TBPK: ORAL_TABLET | ORAL | 0 refills | Status: DC

## 2016-12-01 MED ORDER — AZITHROMYCIN 250 MG PO TABS: 250.0000 mg | ORAL_TABLET | Freq: Every day | ORAL | 0 refills | Status: DC

## 2016-12-01 NOTE — ED Triage Notes (Signed)
[facility] productive [facility] x2 weeks patient has been taking OTC medications with minimal relief.

## 2016-12-01 NOTE — ED Provider Notes (Signed)
CSN: [id number]     Arrival date & time 12/01/16  1418 History   First MD Initiated Contact with Patient 12/01/16 1532     Chief Complaint  Patient presents with  . Croup   (Consider location/radiation/quality/duration/timing/severity/associated sxs/prior Treatment) Patient c/o cough and uri sx's for over 2 weeks.  She c/o wheezing and exacerbation of asthma sx's.   The history is provided by the patient.  Croup  This is a new problem. The current episode started more than 1 week ago. The problem occurs constantly. The problem has not changed since onset.The symptoms are aggravated by exertion. Nothing relieves the symptoms. She has tried nothing for the symptoms.    Past Medical History:  Diagnosis Date  . Asthma   . CHF (congestive heart failure) (HCC)   . Diabetes mellitus without complication (HCC)   . Heart block   . Hyperlipidemia   . Hypertension   . Stroke [facility](HCC)    Past Surgical History:  Procedure Laterality Date  . ABDOMINAL HYSTERECTOMY    . BREAST LUMPECTOMY     left  . EP IMPLANTABLE DEVICE    . PACEMAKER INSERTION     Family History  Problem Relation Age of Onset  . Congestive Heart Failure Mother   . Heart attack Father   . Stroke Maternal Aunt    Social History  Substance Use Topics  . Smoking status: Current Every Day Smoker    Packs/day: 0.50    Types: Cigarettes  . Smokeless tobacco: Never Used  . Alcohol use Yes     Comment: occassional   OB History    No data available     Review of Systems  Constitutional: Positive for fatigue.  HENT: Negative.   Eyes: Negative.   Respiratory: Positive for cough.   Cardiovascular: Negative.   Gastrointestinal: Negative.   Endocrine: Negative.   Genitourinary: Negative.   Musculoskeletal: Negative.   Allergic/Immunologic: Negative.   Neurological: Negative.   Hematological: Negative.   Psychiatric/Behavioral: Negative.     Allergies  Kiwi extract; Iodine; and Shrimp [shellfish  allergy]  Home Medications   Prior to Admission medications   Medication Sig Start Date End Date Taking? Authorizing Provider  clopidogrel (PLAVIX) 75 MG tablet Take 1 tablet (75 mg total) by mouth daily. 09/07/16  Yes Tyrone Nineyan B Grunz, MD  fluticasone furoate-vilanterol (BREO ELLIPTA) 100-25 MCG/INH AEPB Inhale 1 puff into the lungs daily as needed.   Yes Historical Provider, MD  Insulin Lispro Prot & Lispro (HUMALOG MIX 75/25 KWIKPEN) (75-25) 100 UNIT/ML Kwikpen Inject 20 Units into the skin 2 (two) times daily.   Yes Historical Provider, MD  lisinopril-hydrochlorothiazide (PRINZIDE,ZESTORETIC) 10-12.5 MG tablet Take 1 tablet by mouth at bedtime.   Yes Historical Provider, MD  metFORMIN (GLUCOPHAGE) 1000 MG tablet Take 1,000 mg by mouth at bedtime.   Yes Historical Provider, MD  omeprazole (PRILOSEC) 40 MG capsule Take 40 mg by mouth at bedtime. 01/02/14  Yes Historical Provider, MD  pravastatin (PRAVACHOL) 20 MG tablet Take 10 mg by mouth at bedtime.   Yes Historical Provider, MD  azithromycin (ZITHROMAX) 250 MG tablet Take 1 tablet (250 mg total) by mouth daily. Take first 2 tablets together, then 1 every day until finished. 12/01/16   Deatra CanterWilliam J Jahnae Mcadoo, FNP  Liraglutide (VICTOZA) 18 MG/3ML SOPN Inject 1.2 mg into the skin every morning.    Historical Provider, MD  meclizine (ANTIVERT) 25 MG tablet Take 1 tablet (25 mg total) by mouth 2 (two) times daily as needed  for dizziness. 10/06/16   Zadie Rhineonald Wickline, MD  methylPREDNISolone (MEDROL DOSEPAK) 4 MG TBPK tablet Take 6-5-4-3-2-1 po qd 12/01/16   Deatra CanterWilliam J Shakerra Red, FNP   Meds Ordered and Administered this Visit  Medications - No data to display  BP 142/57 (BP Location: Right Arm)   Pulse 76   Temp 97.9 F (36.6 C) (Oral)   Resp 16   SpO2 99%  No data found.   Physical Exam  Constitutional: She appears well-developed and well-nourished.  HENT:  Head: Normocephalic and atraumatic.  Right Ear: External ear normal.  Left Ear: External ear  normal.  Mouth/Throat: Oropharynx is clear and moist.  Eyes: EOM are normal. Pupils are equal, round, and reactive to light.  Neck: Normal range of motion. Neck supple.  Cardiovascular: Normal rate, regular rhythm and normal heart sounds.   Pulmonary/Chest: Effort normal. She has wheezes.  Abdominal: Soft. Bowel sounds are normal.  Nursing note and vitals reviewed.   Urgent Care Course   Clinical Course     Procedures (including critical care time)  Labs Review Labs Reviewed - No data to display  Imaging Review No results found.   Visual Acuity Review  Right Eye Distance:   Left Eye Distance:   Bilateral Distance:    Right Eye Near:   Left Eye Near:    Bilateral Near:         MDM   1. Exacerbation of intermittent asthma, unspecified asthma severity   2. Bronchitis    Zpak as directed Medrol dose pack as directed 4mg  #21 Continue albuterol MDI SABA prn  Push po fluids, rest, tylenol and motrin otc prn as directed for fever, arthralgias, and myalgias.  Follow up prn if sx's continue or persist.    Deatra CanterWilliam J Kippy Gohman, FNP 12/01/16 [phone]

## 2017-05-07 ENCOUNTER — Ambulatory Visit: Payer: Medicare Other | Admitting: Neurology

## 2017-05-21 ENCOUNTER — Encounter: Payer: Self-pay | Admitting: Neurology

## 2017-05-21 ENCOUNTER — Encounter (INDEPENDENT_AMBULATORY_CARE_PROVIDER_SITE_OTHER): Payer: Self-pay

## 2017-05-21 ENCOUNTER — Ambulatory Visit (INDEPENDENT_AMBULATORY_CARE_PROVIDER_SITE_OTHER): Payer: Medicare Other | Admitting: Neurology

## 2017-05-21 VITALS — BP 104/55 | HR 59 | Ht 64.0 in | Wt 194.2 lb

## 2017-05-21 DIAGNOSIS — G3184 Mild cognitive impairment, so stated: Secondary | ICD-10-CM

## 2017-05-21 NOTE — Patient Instructions (Signed)
I had a long d/w patient about his recent stroke, risk for recurrent stroke/TIAs, personally independently reviewed imaging studies and stroke evaluation results and answered questions.Continue Eliquis (apixaban) daily  for secondary stroke prevention and maintain strict control of hypertension with blood pressure goal below 130/90, diabetes with hemoglobin A1c goal below 6.5% and lipids with LDL cholesterol goal below 70 mg/dL. I also advised the patient to eat a healthy diet with plenty of whole grains, cereals, fruits and vegetables, exercise regularly and maintain ideal body weight .I encouraged patient to take fish daily and to increase participation in cognitively challenging activities like solving crossword puzzles, playing bridge, sudoku and word searches to help with her mild cognitive impairment. We also talked about memory compensation strategies. She may return for follow-up in a year or call earlier if necessary  Memory Compensation Strategies  1. Use "WARM" strategy.  W= write it down  A= associate it  R= repeat it  M= make a mental note  2.   You can keep a Glass blower/designerMemory Notebook.  Use a 3-ring notebook with sections for the following: calendar, important names and phone numbers,  medications, doctors' names/phone numbers, lists/reminders, and a section to journal what you did  each day.   3.    Use a calendar to write appointments down.  4.    Write yourself a schedule for the day.  This can be placed on the calendar or in a separate section of the Memory Notebook.  Keeping a  regular schedule can help memory.  5.    Use medication organizer with sections for each day or morning/evening pills.  You may need help loading it  6.    Keep a basket, or pegboard by the door.  Place items that you need to take out with you in the basket or on the pegboard.  You may also want to  include a message board for reminders.  7.    Use sticky notes.  Place sticky notes with reminders in a place  where the task is performed.  For example: " turn off the  stove" placed by the stove, "lock the door" placed on the door at eye level, " take your medications" on  the bathroom mirror or by the place where you normally take your medications.  8.    Use alarms/timers.  Use while cooking to remind yourself to check on food or as a reminder to take your medicine, or as a  reminder to make a call, or as a reminder to perform another task, etc.

## 2017-05-21 NOTE — Progress Notes (Signed)
[facility] [address] [place]. [place] [address] [phone]       OFFICE FOLLOW-UP NOTE  Claire. Claire Mcgrath Date of Birth:  12/16/1946 Medical Record Number:  [id number]   HPI: Initial visit 11/06/2016 ;Claire Mcgrath is a 71 year Caucasian lady seen today for first office follow-up visit following hospital admission for stroke in September 2017. Claire Merfeld Rippyis an 71 y.o.femalehistory diabetes mellitus, hypertension, hyperlipidemia, asthma and CHF, presenting with weakness involving lower extremities, right greater than left as well as right upper extremity weakness. Lower extremity symptoms were first noticed 2 days ago. She noticed she had particular problems with her right lower extremity and to a lesser extent left upper extremity. One day prior to admission she noticed weakness of her right lower extremity with dropping objects as well as difficulty with writing. She had no change in speech. No facial droop is been reported. She has no previous history of stroke nor TIA. She takes aspirin daily. 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 She was LKW on 09/02/2016, time unknown. Patient was not administered IV t-PA secondary to  Minimal deficits; beyond time window for treatment consideration. She was admitted  for further evaluation and treatment. She had a follow-up repeat CT scan the next day which did not show significant change in the infarct appearance or any additional infarct. CT angiogram of the brain and neck were both performed did not show significant large vessel stenosis or occlusion. LDL cholesterol was 77 mg percent and hemoglobin A1c was elevated at 9.1. Transthoracic echo showed normal ejection fraction without cardiac source of embolism. Patient was seen by physical and occupation therapy and aspirin was changed to Plavix. She stretches tolerating Plavix well without bleeding or bruising but feels her taste is  altered. This seems to have improved slightly off late. She is getting physical and occupation therapy that she has recently finished. Her walking and balance are much better. She still feels at times not sure of her balance but she has had no falls or injuries. She states her blood pressure is well controlled and today it is 119/53. She is working on better sugar control with a primary care physician. She does have some numbness in the right hand but she feels this is old from carpal tunnel. She has no new complaints today. She is eating healthy and has lost 10 pounds. She plans to join the [facility] soon and be more active. 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 CT scan of the head showed no acute abnormality. Her blood pressure was elevated during this visit. Should she describes no recurrent vertigo episodes. Update 05/21/2017 : She returns for follow-up after last visit 6 months ago. She states she is doing well without recurrent stroke or TIA symptoms. She made full recovery from his stroke and no residuals deficits. He however has noticed that her memory difficulties seem to have gotten worse since her stroke. She did have some mild prior short-term memory difficulties which now more apparent. At times she cannot remember names and information but may come back later. At times she has trouble completing sentences. The patient was recently found to have paroxysmal to fibrillation upon interrogation of the pacemaker by her cardiologist  Dr Darrold Junker in [place] and has been switched from Plavix to eliquis which is tolerating well with only minor bruising and no  significant bleeding. She states her blood pressure is well controlled and today it is 104/55. She states her diabetes is also well controlled. She cannot tell me when last A1c was checked. She is tolerating Pravachol well without muscle aches or pains. ROS:     14 system review of systems is positive for bruising, memory difficulties and all other systems   negative PMH:  Past Medical History:  Diagnosis Date  . Asthma   . CHF (congestive heart failure) (HCC)   . Diabetes mellitus without complication (HCC)   . Heart block   . Hyperlipidemia   . Hypertension   . Stroke [facility](HCC)     Social History:  Social History   Social History  . Marital status: Married    Spouse name: N/A  . Number of children: N/A  . Years of education: N/A   Occupational History  . Not on file.   Social History Main Topics  . Smoking status: Current Every Day Smoker    Packs/day: 0.50    Types: Cigarettes  . Smokeless tobacco: Never Used  . Alcohol use Yes     Comment: occassional  . Drug use: No  . Sexual activity: Not on file   Other Topics Concern  . Not on file   Social History Narrative  . No narrative on file    Medications:   Current Outpatient Prescriptions on File Prior to Visit  Medication Sig Dispense Refill  . fluticasone furoate-vilanterol (BREO ELLIPTA) 100-25 MCG/INH AEPB Inhale 1 puff into the lungs daily as needed.    . Insulin Lispro Prot & Lispro (HUMALOG MIX 75/25 KWIKPEN) (75-25) 100 UNIT/ML Kwikpen Inject 20 Units into the skin 2 (two) times daily.    . Liraglutide (VICTOZA) 18 MG/3ML SOPN Inject 1.2 mg into the skin every morning.    Marland Kitchen. lisinopril-hydrochlorothiazide (PRINZIDE,ZESTORETIC) 10-12.5 MG tablet Take 1 tablet by mouth at bedtime.    . metFORMIN (GLUCOPHAGE) 1000 MG tablet Take 1,000 mg by mouth at bedtime.    Marland Kitchen. omeprazole (PRILOSEC) 40 MG capsule Take 40 mg by mouth at bedtime.    . pravastatin (PRAVACHOL) 20 MG tablet Take 10 mg by mouth at bedtime.     No current facility-administered medications on file prior to visit.     Allergies:   Allergies  Allergen Reactions  . Kiwi Extract Anaphylaxis    Feel my throat close up  . Iodine Itching and Swelling       . Shrimp [Shellfish Allergy] Itching and  Swelling    Physical Exam General: well developed, well nourished, elderly african american lady seated, in no evident distress Head: head normocephalic and atraumatic.  Neck: supple with no carotid or supraclavicular bruits Cardiovascular: regular rate and rhythm, no murmurs Musculoskeletal: no deformity Skin:  no rash/petichiae Vascular:  Normal pulses all extremities Vitals:   05/21/17 1503  BP: (!) 104/55  Pulse: (!) 59   Neurologic Exam Mental Status: Awake and fully alert. Oriented to place and time. Recent and remote memory intact. Attention span, concentration and fund of knowledge appropriate. Mood and affect appropriate. Diminished recall 1/3. Animal naming 12.. Clock drawing 4/4. Cranial Nerves: Fundoscopic exam reveals sharp disc margins. Pupils equal, briskly reactive to light. Extraocular movements full without nystagmus. Visual fields full to confrontation. Hearing intact. Facial sensation intact. Face, tongue, palate moves normally and symmetrically.  Motor: Normal bulk and tone. Normal strength in all tested extremity muscles. Sensory.: intact to touch ,pinprick .position and vibratory sensation.  Coordination: Rapid alternating  movements normal in all extremities. Finger-to-nose and heel-to-shin performed accurately bilaterally. Gait and Station: Arises from chair without difficulty. Stance is normal. Gait demonstrates normal stride length and balance . Able to heel, toe and tandem walk with mild difficulty.  Reflexes: 1+ and symmetric. Toes downgoing.   NIHSS  0 Modified Rankin  2   ASSESSMENT: 70year lady with Left thalamic lacunar infarct in September 2017 due to small vessel disease. Vascular risk factors of diabetes, hypertension, hyperlipidemia and paroxysmal atrial fibrillation which was recently diagnosed on pacemaker interrogation    PLAN: I had a long d/w patient about his recent stroke, risk for recurrent stroke/TIAs, personally independently reviewed  imaging studies and stroke evaluation results and answered questions.Continue Eliquis (apixaban) daily  for secondary stroke prevention and maintain strict control of hypertension with blood pressure goal below 130/90, diabetes with hemoglobin A1c goal below 6.5% and lipids with LDL cholesterol goal below 70 mg/dL. I also advised the patient to eat a healthy diet with plenty of whole grains, cereals, fruits and vegetables, exercise regularly and maintain ideal body weight .I encouraged patient to take fish daily and to increase participation in cognitively challenging activities like solving crossword puzzles, playing bridge, sudoku and word searches to help with her mild cognitive impairment. We also talked about memory compensation strategies. She may return for follow-up in a year or call earlier if necessary  Greater than 50% of time during this 25 minute visit was spent on counseling,explanation of diagnosis, planning of further management, discussion with patient and family and coordination of care Delia Heady, MD  [facility] [address] [place], [place] [address]  Phone [phone] Fax [phone] Note: This document was prepared with digital dictation and possible smart phrase technology. Any transcriptional errors that result from this process are unintentional

## 2018-05-23 ENCOUNTER — Encounter: Payer: Self-pay | Admitting: Neurology

## 2018-05-23 ENCOUNTER — Ambulatory Visit: Payer: Medicare Other | Admitting: Neurology

## 2018-05-23 VITALS — BP 115/66 | HR 67 | Ht 64.0 in | Wt 198.5 lb

## 2018-05-23 DIAGNOSIS — I6990 Unspecified sequelae of unspecified cerebrovascular disease: Secondary | ICD-10-CM | POA: Diagnosis not present

## 2018-05-23 NOTE — Progress Notes (Signed)
[facility] [address] [place]. [place] [address] [phone]       OFFICE FOLLOW-UP NOTE  Claire. Claire Mcgrath Date of Birth:  [birth year] Medical Record Number:  [id number]   HPI: Initial visit 11/06/2016 ;Claire Mcgrath is a 37 year Caucasian lady seen today for first office follow-up visit following hospital admission for stroke in September 2017. Claire Ritacco Rippyis an 72 y.o.femalehistory diabetes mellitus, hypertension, hyperlipidemia, asthma and CHF, presenting with weakness involving lower extremities, right greater than left as well as right upper extremity weakness. Lower extremity symptoms were first noticed 2 days ago. She noticed she had particular problems with her right lower extremity and to a lesser extent left upper extremity. One day prior to admission she noticed weakness of her right lower extremity with dropping objects as well as difficulty with writing. She had no change in speech. No facial droop is been reported. She has no previous history of stroke nor TIA. She takes aspirin daily. 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 She was LKW on 09/02/2016, time unknown. Patient was not administered IV t-PA secondary to  Minimal deficits; beyond time window for treatment consideration. She was admitted  for further evaluation and treatment. She had a follow-up repeat CT scan the next day which did not show significant change in the infarct appearance or any additional infarct. CT angiogram of the brain and neck were both performed did not show significant large vessel stenosis or occlusion. LDL cholesterol was 77 mg percent and hemoglobin A1c was elevated at 9.1. Transthoracic echo showed normal ejection fraction without cardiac source of embolism. Patient was seen by physical and occupation therapy and aspirin was changed to Plavix. She stretches tolerating Plavix well without bleeding or bruising but feels her taste is  altered. This seems to have improved slightly off late. She is getting physical and occupation therapy that she has recently finished. Her walking and balance are much better. She still feels at times not sure of her balance but she has had no falls or injuries. She states her blood pressure is well controlled and today it is 119/53. She is working on better sugar control with a primary care physician. She does have some numbness in the right hand but she feels this is old from carpal tunnel. She has no new complaints today. She is eating healthy and has lost 10 pounds. She plans to join the Claire Mcgrath soon and be more active. 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 CT scan of the head showed no acute abnormality. Her blood pressure was elevated during this visit. Should she describes no recurrent vertigo episodes. Update 05/21/2017 : She returns for follow-up after last visit 6 months ago. She states she is doing well without recurrent stroke or TIA symptoms. She made full recovery from his stroke and no residuals deficits. He however has noticed that her memory difficulties seem to have gotten worse since her stroke. She did have some mild prior short-term memory difficulties which now more apparent. At times she cannot remember names and information but may come back later. At times she has trouble completing sentences. The patient was recently found to have paroxysmal to fibrillation upon interrogation of the pacemaker by her cardiologist  Dr Darrold Junker in [place] and has been switched from Plavix to eliquis which is tolerating well with only minor bruising and no  significant bleeding. She states her blood pressure is well controlled and today it is 104/55. She states her diabetes is also well controlled. She cannot tell me when last A1c was checked. She is tolerating Pravachol well without muscle aches or pains. Update  05/23/2018 : She returns for follow-up after last visit a year ago. She is doing well and states she has had no recurrent stroke or TIA symptoms. She feels subjectively memory difficulties are also stable and not bothersome. She remains on eliquis but does complain of some bruising particularly at the site of her insulin injections. She states her blood pressure is well controlled. She does not check her fasting sugars but states she is doing better and last chemotherapy hemoglobin A1c was down to 7 from 9 in the past. She remains on Pravachol which is tolerating well without muscle aches and pains and states her last lipid profile check was satisfactory. She states she eats healthy and stays quite active. She has no new complaints today. ROS:   14 Mcgrath review of systems is positive for bruising, memory difficulties and all other systems negative PMH:  Past Medical History:  Diagnosis Date  . Asthma   . CHF (congestive heart failure) (HCC)   . Diabetes mellitus without complication (HCC)   . Heart block   . Hyperlipidemia   . Hypertension   . Stroke [facility](HCC)     Social History:  Social History   Socioeconomic History  . Marital status: Married    Spouse name: Not on file  . Number of children: Not on file  . Years of education: Not on file  . Highest education level: Not on file  Occupational History  . Not on file  Social Needs  . Financial resource strain: Not on file  . Food insecurity:    Worry: Not on file    Inability: Not on file  . Transportation needs:    Medical: Not on file    Non-medical: Not on file  Tobacco Use  . Smoking status: Current Every Day Smoker    Packs/day: 0.50    Types: Cigarettes  . Smokeless tobacco: Never Used  Substance and Sexual Activity  . Alcohol use: Yes    Comment: occassional  . Drug use: No  . Sexual activity: Not on file  Lifestyle  . Physical activity:    Days per week: Not on file    Minutes per session: Not on file  . Stress: Not  on file  Relationships  . Social connections:    Talks on phone: Not on file    Gets together: Not on file    Attends religious service: Not on file    Active member of club or organization: Not on file    Attends meetings of clubs or organizations: Not on file    Relationship status: Not on file  . Intimate partner violence:    Fear of current or ex partner: Not on file    Emotionally abused: Not on file    Physically abused: Not on file    Forced sexual activity: Not on file  Other Topics Concern  . Not on file  Social History Narrative  . Not on file    Medications:   Current Outpatient Medications on File Prior to Visit  Medication Sig Dispense Refill  . ELIQUIS 5 MG TABS tablet Take 5 mg by mouth 2 (two) times daily.     . fluticasone furoate-vilanterol (BREO ELLIPTA) 100-25 MCG/INH AEPB Inhale 1 puff  into the lungs daily as needed.    . Insulin Lispro Prot & Lispro (HUMALOG MIX 75/25 KWIKPEN) (75-25) 100 UNIT/ML Kwikpen Inject 20 Units into the skin 2 (two) times daily. Take 20 units in the am and 12 in the pm    . Liraglutide (VICTOZA) 18 MG/3ML SOPN Inject 1.2 mg into the skin every morning.    Marland Kitchen lisinopril-hydrochlorothiazide (PRINZIDE,ZESTORETIC) 10-12.5 MG tablet Take 1 tablet by mouth at bedtime.    . metFORMIN (GLUCOPHAGE) 1000 MG tablet Take 1,000 mg by mouth at bedtime.    . metoprolol succinate (TOPROL-XL) 25 MG 24 hr tablet     . omeprazole (PRILOSEC) 40 MG capsule Take 40 mg by mouth at bedtime.    . pravastatin (PRAVACHOL) 20 MG tablet Take 10 mg by mouth at bedtime.     No current facility-administered medications on file prior to visit.     Allergies:   Allergies  Allergen Reactions  . Kiwi Extract Anaphylaxis    Feel my throat close up  . Iodine Itching and Swelling       . Shrimp [Shellfish Allergy] Itching and Swelling    Physical Exam General: well developed, well nourished, elderly african american lady seated, in no evident distress Head:  head normocephalic and atraumatic.  Neck: supple with no carotid or supraclavicular bruits Cardiovascular: regular rate and rhythm, no murmurs Musculoskeletal: no deformity Skin:  no rash/petichiae Vascular:  Normal pulses all extremities Vitals:   05/23/18 1436  BP: 115/66  Pulse: 67   Neurologic Exam Mental Status: Awake and fully alert. Oriented to place and time. Recent and remote memory intact. Attention span, concentration and fund of knowledge appropriate. Mood and affect appropriate.   Cranial Nerves: Fundoscopic exam not done. Pupils equal, briskly reactive to light. Extraocular movements full without nystagmus. Visual fields full to confrontation. Hearing intact. Facial sensation intact. Face, tongue, palate moves normally and symmetrically.  Motor: Normal bulk and tone. Normal strength in all tested extremity muscles. Sensory.: intact to touch ,pinprick .position and vibratory sensation.  Coordination: Rapid alternating movements normal in all extremities. Finger-to-nose and heel-to-shin performed accurately bilaterally. Gait and Station: Arises from chair without difficulty. Stance is normal. Gait demonstrates normal stride length and balance . Able to heel, toe and tandem walk with mild difficulty.  Reflexes: 1+ and symmetric. Toes downgoing.   NIHSS  0 Modified Rankin  2   ASSESSMENT: 71year lady with Left thalamic lacunar infarct in September 2017 due to small vessel disease. Vascular risk factors of diabetes, hypertension, hyperlipidemia and paroxysmal atrial fibrillation which was recently diagnosed on pacemaker interrogation    PLAN: I had a long d/w patient about her remote stroke, risk for recurrent stroke/TIAs, personally independently reviewed imaging studies and stroke evaluation results and answered questions.Continue Eliquis (apixaban) daily  for secondary stroke prevention and maintain strict control of hypertension with blood pressure goal below 130/90,  diabetes with hemoglobin A1c goal below 6.5% and lipids with LDL cholesterol goal below 70 mg/dL. I also advised the patient to eat a healthy diet with plenty of whole grains, cereals, fruits and vegetables, exercise regularly and maintain ideal body weight  No routine scheduled follow-up appointment is necessary but she may return for follow-up in the future only as needed Greater than 50% of time during this 25 minute visit was spent on counseling,explanation of diagnosis, planning of further management, discussion with patient and family and coordination of care Delia Heady, MD  [facility] [address] [place],  [place] [address]  Phone [phone] Fax [phone] Note: This document was prepared with digital dictation and possible smart phrase technology. Any transcriptional errors that result from this process are unintentional

## 2018-05-23 NOTE — Patient Instructions (Signed)
I had a long d/w patient about her remote stroke, risk for recurrent stroke/TIAs, personally independently reviewed imaging studies and stroke evaluation results and answered questions.Continue Eliquis (apixaban) daily  for secondary stroke prevention and maintain strict control of hypertension with blood pressure goal below 130/90, diabetes with hemoglobin A1c goal below 6.5% and lipids with LDL cholesterol goal below 70 mg/dL. I also advised the patient to eat a healthy diet with plenty of whole grains, cereals, fruits and vegetables, exercise regularly and maintain ideal body weight  No routine scheduled follow-up appointment is necessary but she may return for follow-up in the future only as needed

## 2020-01-14 ENCOUNTER — Other Ambulatory Visit: Payer: Self-pay | Admitting: Internal Medicine

## 2020-01-14 DIAGNOSIS — R1011 Right upper quadrant pain: Secondary | ICD-10-CM

## 2020-01-21 ENCOUNTER — Ambulatory Visit
Admission: RE | Admit: 2020-01-21 | Discharge: 2020-01-21 | Disposition: A | Discharge status: Home / Self Care | Payer: Medicare Other | Source: Ambulatory Visit | Attending: Internal Medicine | Admitting: Internal Medicine

## 2020-01-21 DIAGNOSIS — R1011 Right upper quadrant pain: Secondary | ICD-10-CM

## 2020-09-03 ENCOUNTER — Encounter (HOSPITAL_COMMUNITY): Payer: Self-pay | Admitting: Emergency Medicine

## 2020-09-03 ENCOUNTER — Other Ambulatory Visit: Payer: Self-pay

## 2020-09-03 ENCOUNTER — Ambulatory Visit (HOSPITAL_COMMUNITY)
Admission: EM | Admit: 2020-09-03 | Discharge: 2020-09-03 | Disposition: A | Discharge status: Home / Self Care | Payer: Medicare Other | Attending: Emergency Medicine | Admitting: Emergency Medicine

## 2020-09-03 DIAGNOSIS — Z7951 Long term (current) use of inhaled steroids: Secondary | ICD-10-CM | POA: Insufficient documentation

## 2020-09-03 DIAGNOSIS — J449 Chronic obstructive pulmonary disease, unspecified: Secondary | ICD-10-CM | POA: Diagnosis not present

## 2020-09-03 DIAGNOSIS — Z20822 Contact with and (suspected) exposure to covid-19: Secondary | ICD-10-CM | POA: Insufficient documentation

## 2020-09-03 DIAGNOSIS — R05 Cough: Secondary | ICD-10-CM | POA: Diagnosis present

## 2020-09-03 DIAGNOSIS — Z7901 Long term (current) use of anticoagulants: Secondary | ICD-10-CM | POA: Insufficient documentation

## 2020-09-03 DIAGNOSIS — E119 Type 2 diabetes mellitus without complications: Secondary | ICD-10-CM | POA: Diagnosis not present

## 2020-09-03 DIAGNOSIS — E669 Obesity, unspecified: Secondary | ICD-10-CM | POA: Diagnosis not present

## 2020-09-03 DIAGNOSIS — R059 Cough, unspecified: Secondary | ICD-10-CM

## 2020-09-03 DIAGNOSIS — Z950 Presence of cardiac pacemaker: Secondary | ICD-10-CM | POA: Diagnosis not present

## 2020-09-03 DIAGNOSIS — I110 Hypertensive heart disease with heart failure: Secondary | ICD-10-CM | POA: Diagnosis not present

## 2020-09-03 DIAGNOSIS — Z79899 Other long term (current) drug therapy: Secondary | ICD-10-CM | POA: Diagnosis not present

## 2020-09-03 DIAGNOSIS — J069 Acute upper respiratory infection, unspecified: Secondary | ICD-10-CM

## 2020-09-03 DIAGNOSIS — E785 Hyperlipidemia, unspecified: Secondary | ICD-10-CM | POA: Diagnosis not present

## 2020-09-03 DIAGNOSIS — I509 Heart failure, unspecified: Secondary | ICD-10-CM | POA: Diagnosis not present

## 2020-09-03 DIAGNOSIS — Z8673 Personal history of transient ischemic attack (TIA), and cerebral infarction without residual deficits: Secondary | ICD-10-CM | POA: Insufficient documentation

## 2020-09-03 DIAGNOSIS — F17210 Nicotine dependence, cigarettes, uncomplicated: Secondary | ICD-10-CM | POA: Diagnosis not present

## 2020-09-03 DIAGNOSIS — Z794 Long term (current) use of insulin: Secondary | ICD-10-CM | POA: Diagnosis not present

## 2020-09-03 LAB — SARS CORONAVIRUS 2 (TAT 6-24 HRS): SARS Coronavirus 2: NEGATIVE

## 2020-09-03 MED ORDER — DOXYCYCLINE HYCLATE 100 MG PO CAPS: 100.0000 mg | ORAL_CAPSULE | Freq: Two times a day (BID) | ORAL | 0 refills | Status: DC

## 2020-09-03 MED ORDER — PREDNISONE 20 MG PO TABS: 40.0000 mg | ORAL_TABLET | Freq: Every day | ORAL | 0 refills | Status: AC

## 2020-09-03 NOTE — Discharge Instructions (Signed)
Self isolate until covid results are back and negative.  Will notify you by phone of any positive findings. Your negative results will be sent through your MyChart.     Push fluids to ensure adequate hydration and keep secretions thin.  Prednisone course to help with your breathing as well as antibiotics.  Please follow up with your PCP next week for recheck, return or go to the ER for any worsening of symptoms.

## 2020-09-03 NOTE — ED Provider Notes (Addendum)
[facility]    CSN: [id number] Arrival date & time: 09/03/20  1314      History   Chief Complaint Chief Complaint  Patient presents with  . Cough  . Sore Throat  . Sinus Problem    HPI Claire Mcgrath is a 74 y.o. female.   Claire Mcgrath presents with complaints of cough, congsestion, "flu symptoms."  Originally felt like allergies, she had been out in her yard last week. This was followed by sore throat. Some pain to right ear. Head congestion. Symptoms started about a week ago. She has been more fatigued. No known fevers. Last night felt cold. No gi symptoms. No known ill contacts. No swelling. Has been taking her prescribed medications but hasn't taken today. Some shortness of breath, worse with coughing. She does smoke. States she has "borderline COPD" and has two inhalers. Uses CPAP at night. No chest tightness. Some wheezing,  this can be normal for her, however. Cough is productive of green mucus. She has been vaccinated for covid. No lower extremity edema.   ROS per HPI, negative if not otherwise mentioned.      Past Medical History:  Diagnosis Date  . Asthma   . CHF (congestive heart failure) (HCC)   . Diabetes mellitus without complication (HCC)   . Heart block   . Hyperlipidemia   . Hypertension   . Stroke [facility])     Patient Active Problem List   Diagnosis Date Noted  . Right sided weakness 09/04/2016  . Acute ischemic stroke (HCC) 09/04/2016  . DM2 (diabetes mellitus, type 2) (HCC) 09/04/2016  . HTN (hypertension) 09/04/2016  . Asthma 04/27/2014  . Hyperlipemia 04/27/2014  . CHF (congestive heart failure) (HCC) 04/27/2014  . Obesity 04/27/2014    Past Surgical History:  Procedure Laterality Date  . ABDOMINAL HYSTERECTOMY    . BREAST LUMPECTOMY     left  . EP IMPLANTABLE DEVICE    . PACEMAKER INSERTION      OB History   No obstetric history on file.      Home Medications    Prior to Admission medications   Medication Sig Start  Date End Date Taking? Authorizing Provider  doxycycline (VIBRAMYCIN) 100 MG capsule Take 1 capsule (100 mg total) by mouth 2 (two) times daily. 09/03/20   Camilia Caywood, Barron Alvine, NP  ELIQUIS 5 MG TABS tablet Take 5 mg by mouth 2 (two) times daily.  05/15/17   [provider]  fluticasone furoate-vilanterol (BREO ELLIPTA) 100-25 MCG/INH AEPB Inhale 1 puff into the lungs daily as needed.    [provider]  Insulin Lispro Prot & Lispro (HUMALOG MIX 75/25 KWIKPEN) (75-25) 100 UNIT/ML Kwikpen Inject 20 Units into the skin 2 (two) times daily. Take 20 units in the am and 12 in the pm    [provider]  Liraglutide (VICTOZA) 18 MG/3ML SOPN Inject 1.2 mg into the skin every morning.    [provider]  lisinopril-hydrochlorothiazide (PRINZIDE,ZESTORETIC) 10-12.5 MG tablet Take 1 tablet by mouth at bedtime.    [provider]  metFORMIN (GLUCOPHAGE) 1000 MG tablet Take 1,000 mg by mouth at bedtime.    [provider]  metoprolol succinate (TOPROL-XL) 25 MG 24 hr tablet  05/09/17   [provider]  omeprazole (PRILOSEC) 40 MG capsule Take 40 mg by mouth at bedtime. 01/02/14   [provider]  pravastatin (PRAVACHOL) 20 MG tablet Take 10 mg by mouth at bedtime.    [provider]  predniSONE (DELTASONE) 20 MG tablet Take 2 tablets (40 mg total) by mouth daily with breakfast for 5 days. 09/03/20 09/08/20  Georgetta Haber, NP    Family History Family History  Problem Relation Age of Onset  . Congestive Heart Failure Mother   . Heart attack Father   . Stroke Maternal Aunt     Social History Social History   Tobacco Use  . Smoking status: Current Every Day Smoker    Packs/day: 0.50    Types: Cigarettes  . Smokeless tobacco: Never Used  Substance Use Topics  . Alcohol use: Yes    Comment: occassional  . Drug use: No     Allergies   Kiwi extract, Iodine, and Shrimp [shellfish allergy]   Review of Systems Review of  Systems   Physical Exam Triage Vital Signs ED Triage Vitals  Enc Vitals Group     BP 09/03/20 1548 (!) 157/60     Pulse Rate 09/03/20 1548 65     Resp 09/03/20 1548 17     Temp 09/03/20 1548 98.8 F (37.1 C)     Temp Source 09/03/20 1548 Oral     SpO2 09/03/20 1548 98 %     Weight --      Height --      Head Circumference --      Peak Flow --      Pain Score 09/03/20 1549 8     Pain Loc --      Pain Edu? --      Excl. in GC? --    No data found.  Updated Vital Signs BP (!) 157/60 (BP Location: Left Arm)   Pulse 65   Temp 98.8 F (37.1 C) (Oral)   Resp 17   SpO2 98%    Physical Exam Constitutional:      General: She is not in acute distress.    Appearance: She is well-developed.  Cardiovascular:     Rate and Rhythm: Normal rate.  Pulmonary:     Effort: Pulmonary effort is normal.     Comments: Course lung sounds throughout with scattered wheezing, significant upper airway congestion noted  Musculoskeletal:     Right lower leg: No edema.     Left lower leg: No edema.  Skin:    General: Skin is warm and dry.  Neurological:     Mental Status: She is alert and oriented to person, place, and time.      UC Treatments / Results  Labs (all labs ordered are listed, but only abnormal results are displayed) Labs Reviewed  SARS CORONAVIRUS 2 (TAT 6-24 HRS)    EKG   Radiology No results found.  Procedures Procedures (including critical care time)  Medications Ordered in UC Medications - No data to display  Initial Impression / Assessment and Plan / UC Course  I have reviewed the triage vital signs and the nursing notes.  Pertinent labs & imaging results that were available during my care of the patient were reviewed by me and considered in my medical decision making (see chart for details).     Upper respiratory symptoms with congestion and cough. Afebrile. No work of breathing noted but with wheezing, congestion. No swelling. Covid testing pending  and isolation instructions provided.  Copd exacerbation treatment initiated as well with strict follow up/ return precautions discussed.  Patient verbalized understanding and agreeable to plan.   Final Clinical Impressions(s) / UC Diagnoses   Final diagnoses:  Upper respiratory tract infection, unspecified type  Cough     Discharge Instructions     Self isolate until covid results are back and negative.  Will notify you by phone of any positive findings. Your negative results will be sent through your MyChart.     Push fluids to ensure adequate hydration and keep secretions thin.  Prednisone course to help with your breathing as well as antibiotics.  Please follow up with your PCP next week for recheck, return or go to the ER for any worsening of symptoms.     ED Prescriptions    Medication Sig Dispense Auth. Provider   predniSONE (DELTASONE) 20 MG tablet Take 2 tablets (40 mg total) by mouth daily with breakfast for 5 days. 10 tablet Linus Mako B, NP   doxycycline (VIBRAMYCIN) 100 MG capsule Take 1 capsule (100 mg total) by mouth 2 (two) times daily. 20 capsule Georgetta Haber, NP     PDMP not reviewed this encounter.   Georgetta Haber, NP 09/03/20 1649    Georgetta Haber, NP 09/03/20 1649

## 2020-09-03 NOTE — ED Triage Notes (Signed)
Pt c/o sore throat, sinus pressure, cough, and fatigue x 2-3 days. Pt states she has a productive cough of green phlegm. Pt states she has had cold chills at night.

## 2021-08-28 ENCOUNTER — Observation Stay (HOSPITAL_COMMUNITY)
Admission: EM | Admit: 2021-08-28 | Discharge: 2021-08-30 | Disposition: A | Discharge status: Home / Self Care | Payer: Medicare Other | Attending: Internal Medicine | Admitting: Internal Medicine

## 2021-08-28 ENCOUNTER — Encounter (HOSPITAL_COMMUNITY): Payer: Self-pay | Admitting: Internal Medicine

## 2021-08-28 ENCOUNTER — Emergency Department (HOSPITAL_COMMUNITY): Payer: Medicare Other

## 2021-08-28 DIAGNOSIS — Z794 Long term (current) use of insulin: Secondary | ICD-10-CM | POA: Diagnosis not present

## 2021-08-28 DIAGNOSIS — E1159 Type 2 diabetes mellitus with other circulatory complications: Secondary | ICD-10-CM | POA: Diagnosis not present

## 2021-08-28 DIAGNOSIS — Z79899 Other long term (current) drug therapy: Secondary | ICD-10-CM | POA: Diagnosis not present

## 2021-08-28 DIAGNOSIS — I110 Hypertensive heart disease with heart failure: Secondary | ICD-10-CM | POA: Insufficient documentation

## 2021-08-28 DIAGNOSIS — J45909 Unspecified asthma, uncomplicated: Secondary | ICD-10-CM | POA: Diagnosis not present

## 2021-08-28 DIAGNOSIS — Z950 Presence of cardiac pacemaker: Secondary | ICD-10-CM | POA: Insufficient documentation

## 2021-08-28 DIAGNOSIS — I10 Essential (primary) hypertension: Secondary | ICD-10-CM | POA: Diagnosis not present

## 2021-08-28 DIAGNOSIS — I509 Heart failure, unspecified: Secondary | ICD-10-CM | POA: Diagnosis not present

## 2021-08-28 DIAGNOSIS — Z8679 Personal history of other diseases of the circulatory system: Secondary | ICD-10-CM

## 2021-08-28 DIAGNOSIS — Z7901 Long term (current) use of anticoagulants: Secondary | ICD-10-CM | POA: Diagnosis not present

## 2021-08-28 DIAGNOSIS — Z20822 Contact with and (suspected) exposure to covid-19: Secondary | ICD-10-CM | POA: Insufficient documentation

## 2021-08-28 DIAGNOSIS — R270 Ataxia, unspecified: Secondary | ICD-10-CM | POA: Diagnosis not present

## 2021-08-28 DIAGNOSIS — Y900 Blood alcohol level of less than 20 mg/100 ml: Secondary | ICD-10-CM | POA: Insufficient documentation

## 2021-08-28 DIAGNOSIS — I4891 Unspecified atrial fibrillation: Secondary | ICD-10-CM | POA: Insufficient documentation

## 2021-08-28 DIAGNOSIS — E119 Type 2 diabetes mellitus without complications: Secondary | ICD-10-CM | POA: Diagnosis not present

## 2021-08-28 DIAGNOSIS — I639 Cerebral infarction, unspecified: Secondary | ICD-10-CM | POA: Diagnosis not present

## 2021-08-28 DIAGNOSIS — R531 Weakness: Secondary | ICD-10-CM | POA: Diagnosis present

## 2021-08-28 LAB — COMPREHENSIVE METABOLIC PANEL
ALT: 15 U/L (ref 0–44)
AST: 18 U/L (ref 15–41)
Albumin: 3.8 g/dL (ref 3.5–5.0)
Alkaline Phosphatase: 46 U/L (ref 38–126)
Anion gap: 9 (ref 5–15)
BUN: 21 mg/dL (ref 8–23)
CO2: 23 mmol/L (ref 22–32)
Calcium: 9.5 mg/dL (ref 8.9–10.3)
Chloride: 107 mmol/L (ref 98–111)
Creatinine, Ser: 1.21 mg/dL — ABNORMAL HIGH (ref 0.44–1.00)
GFR, Estimated: 47 mL/min — ABNORMAL LOW (ref >60)
Glucose, Bld: 86 mg/dL (ref 70–99)
Potassium: 3.9 mmol/L (ref 3.5–5.1)
Sodium: 139 mmol/L (ref 135–145)
Total Bilirubin: 0.6 mg/dL (ref 0.3–1.2)
Total Protein: 8 g/dL (ref 6.5–8.1)

## 2021-08-28 LAB — RESP PANEL BY RT-PCR (FLU A&B, COVID) ARPGX2
Influenza A by PCR: NEGATIVE
Influenza B by PCR: NEGATIVE
SARS Coronavirus 2 by RT PCR: NEGATIVE

## 2021-08-28 LAB — CBG MONITORING, ED: Glucose-Capillary: 85 mg/dL (ref 70–99)

## 2021-08-28 LAB — CBC
HCT: 39.1 % (ref 36.0–46.0)
Hemoglobin: 12.4 g/dL (ref 12.0–15.0)
MCH: 27.8 pg (ref 26.0–34.0)
MCHC: 31.7 g/dL (ref 30.0–36.0)
MCV: 87.7 fL (ref 80.0–100.0)
Platelets: 274 10*3/uL (ref 150–400)
RBC: 4.46 MIL/uL (ref 3.87–5.11)
RDW: 15.3 % (ref 11.5–15.5)
WBC: 10.2 10*3/uL (ref 4.0–10.5)
nRBC: 0 % (ref 0.0–0.2)

## 2021-08-28 LAB — DIFFERENTIAL
Abs Immature Granulocytes: 0.04 10*3/uL (ref 0.00–0.07)
Basophils Absolute: 0.1 10*3/uL (ref 0.0–0.1)
Basophils Relative: 1 %
Eosinophils Absolute: 0.2 10*3/uL (ref 0.0–0.5)
Eosinophils Relative: 2 %
Immature Granulocytes: 0 %
Lymphocytes Relative: 44 %
Lymphs Abs: 4.5 10*3/uL — ABNORMAL HIGH (ref 0.7–4.0)
Monocytes Absolute: 0.9 10*3/uL (ref 0.1–1.0)
Monocytes Relative: 8 %
Neutro Abs: 4.6 10*3/uL (ref 1.7–7.7)
Neutrophils Relative %: 45 %

## 2021-08-28 LAB — I-STAT CHEM 8, ED
BUN: 22 mg/dL (ref 8–23)
Calcium, Ion: 1.16 mmol/L (ref 1.15–1.40)
Chloride: 106 mmol/L (ref 98–111)
Creatinine, Ser: 1.2 mg/dL — ABNORMAL HIGH (ref 0.44–1.00)
Glucose, Bld: 87 mg/dL (ref 70–99)
HCT: 39 % (ref 36.0–46.0)
Hemoglobin: 13.3 g/dL (ref 12.0–15.0)
Potassium: 3.9 mmol/L (ref 3.5–5.1)
Sodium: 142 mmol/L (ref 135–145)
TCO2: 23 mmol/L (ref 22–32)

## 2021-08-28 LAB — PROTIME-INR
INR: 1.1 (ref 0.8–1.2)
Prothrombin Time: 14.6 seconds (ref 11.4–15.2)

## 2021-08-28 LAB — ETHANOL: Alcohol, Ethyl (B): <10 mg/dL (ref <10)

## 2021-08-28 LAB — APTT: aPTT: 30 seconds (ref 24–36)

## 2021-08-28 LAB — TROPONIN I (HIGH SENSITIVITY): Troponin I (High Sensitivity): 11 ng/L (ref <18)

## 2021-08-28 MED ORDER — METOPROLOL SUCCINATE ER 25 MG PO TB24
25.0000 mg | ORAL_TABLET | Freq: Every day | ORAL | Status: DC
  Administered 2021-08-29 – 2021-08-30 (×2): 25 mg via ORAL
  Filled 2021-08-28 (×2): qty 1

## 2021-08-28 MED ORDER — LISINOPRIL-HYDROCHLOROTHIAZIDE 10-12.5 MG PO TABS: 1.0000 | ORAL_TABLET | Freq: Every day | ORAL | Status: DC

## 2021-08-28 MED ORDER — IOHEXOL 350 MG/ML SOLN
75.0000 mL | Freq: Once | INTRAVENOUS | Status: AC | PRN
  Administered 2021-08-28: 75 mL via INTRAVENOUS

## 2021-08-28 MED ORDER — APIXABAN 5 MG PO TABS
5.0000 mg | ORAL_TABLET | Freq: Two times a day (BID) | ORAL | Status: DC
  Administered 2021-08-29 – 2021-08-30 (×4): 5 mg via ORAL
  Filled 2021-08-28 (×4): qty 1

## 2021-08-28 MED ORDER — PRAVASTATIN SODIUM 10 MG PO TABS
10.0000 mg | ORAL_TABLET | Freq: Every day | ORAL | Status: DC
  Administered 2021-08-30: 10 mg via ORAL
  Filled 2021-08-28: qty 1

## 2021-08-28 MED ORDER — METFORMIN HCL 500 MG PO TABS: 1000.0000 mg | ORAL_TABLET | Freq: Every day | ORAL | Status: DC

## 2021-08-28 MED ORDER — INSULIN ASPART PROT & ASPART (70-30 MIX) 100 UNIT/ML ~~LOC~~ SUSP: 12.0000 [IU] | Freq: Every day | SUBCUTANEOUS | Status: DC

## 2021-08-28 MED ORDER — LIRAGLUTIDE 18 MG/3ML ~~LOC~~ SOPN: 1.2000 mg | PEN_INJECTOR | Freq: Every morning | SUBCUTANEOUS | Status: DC

## 2021-08-28 MED ORDER — INSULIN LISPRO PROT & LISPRO (75-25 MIX) 100 UNIT/ML KWIKPEN: 20.0000 [IU] | PEN_INJECTOR | Freq: Two times a day (BID) | SUBCUTANEOUS | Status: DC

## 2021-08-28 MED ORDER — HYDROCHLOROTHIAZIDE 12.5 MG PO CAPS
12.5000 mg | ORAL_CAPSULE | Freq: Every day | ORAL | Status: DC
  Administered 2021-08-29 – 2021-08-30 (×2): 12.5 mg via ORAL
  Filled 2021-08-28 (×2): qty 1

## 2021-08-28 MED ORDER — FLUTICASONE FUROATE-VILANTEROL 100-25 MCG/INH IN AEPB: 1.0000 | INHALATION_SPRAY | Freq: Every day | RESPIRATORY_TRACT | Status: DC | PRN

## 2021-08-28 MED ORDER — INSULIN ASPART PROT & ASPART (70-30 MIX) 100 UNIT/ML ~~LOC~~ SUSP
20.0000 [IU] | Freq: Every day | SUBCUTANEOUS | Status: DC
  Administered 2021-08-29 – 2021-08-30 (×2): 20 [IU] via SUBCUTANEOUS
  Filled 2021-08-28 (×2): qty 10

## 2021-08-28 MED ORDER — PANTOPRAZOLE SODIUM 40 MG PO TBEC: 80.0000 mg | DELAYED_RELEASE_TABLET | Freq: Every day | ORAL | Status: DC

## 2021-08-28 MED ORDER — LISINOPRIL 10 MG PO TABS
10.0000 mg | ORAL_TABLET | Freq: Every day | ORAL | Status: DC
  Administered 2021-08-29 – 2021-08-30 (×2): 10 mg via ORAL
  Filled 2021-08-28 (×2): qty 1

## 2021-08-28 NOTE — ED Provider Notes (Signed)
[facility] Provider Note   CSN: [id number] Arrival date & time: 08/28/21  1843     History CC: Leg weakness  [name] is a 75 y.o. female with a prior history of cerebrovascular accident, heart block, pacemaker, on Eliquis, presenting to emergency department with leg weakness incoordination.  Patient reports he took a nap around 5 PM, woke up around 5:30 PM, and is having difficulty standing up.  She feels like both of her legs are weak and discoordinated.  She describes some lightheadedness as well.  She reports the symptoms are very similar to when she had her prior stroke.  She called EMS who activated code stroke based on this history.  At time she arrived in the ER she denies lightheadedness, she continues to feel weak in her legs.  She denies numbness.  She denies difficulty speaking.  She reports she has had intermittent headache for a week and felt unusually fatigued.  She also reports she has some nausea earlier today which resolved.  Per medical records review, the patient is CT angio had a work-up in September 2017, which showed left PCA P2 irregularities and stenosis, and an age-indeterminate lacunar infarct left-sided.  The patient otherwise lives independently, is quite active on a day-to-day basis, mows her lawn and tends to her house.  HPI     Past Medical History:  Diagnosis Date   Asthma    CHF (congestive heart failure) (HCC)    Diabetes mellitus without complication (HCC)    Heart block    Hyperlipidemia    Hypertension    Stroke [facility](HCC)     Patient Active Problem List   Diagnosis Date Noted   Right sided weakness 09/04/2016   Acute ischemic stroke (HCC) 09/04/2016   DM2 (diabetes mellitus, type 2) (HCC) 09/04/2016   HTN (hypertension) 09/04/2016   Asthma 04/27/2014   Hyperlipemia 04/27/2014   CHF (congestive heart failure) (HCC) 04/27/2014   Obesity 04/27/2014    Past Surgical History:  Procedure Laterality  Date   ABDOMINAL HYSTERECTOMY     BREAST LUMPECTOMY     left   EP IMPLANTABLE DEVICE     PACEMAKER INSERTION       OB History   No obstetric history on file.     Family History  Problem Relation Age of Onset   Congestive Heart Failure Mother    Heart attack Father    Stroke Maternal Aunt     Social History   Tobacco Use   Smoking status: Every Day    Packs/day: 0.50    Types: Cigarettes   Smokeless tobacco: Never  Substance Use Topics   Alcohol use: Yes    Comment: occassional   Drug use: No    Home Medications Prior to Admission medications   Medication Sig Start Date End Date Taking? Authorizing Provider  doxycycline (VIBRAMYCIN) 100 MG capsule Take 1 capsule (100 mg total) by mouth 2 (two) times daily. 09/03/20   Burky, Barron AlvineNatalie B, NP  ELIQUIS 5 MG TABS tablet Take 5 mg by mouth 2 (two) times daily.  05/15/17   [provider]  fluticasone furoate-vilanterol (BREO ELLIPTA) 100-25 MCG/INH AEPB Inhale 1 puff into the lungs daily as needed.    [provider]  Insulin Lispro Prot & Lispro (HUMALOG MIX 75/25 KWIKPEN) (75-25) 100 UNIT/ML Kwikpen Inject 20 Units into the skin 2 (two) times daily. Take 20 units in the am and 12 in the pm    [provider]  Liraglutide (VICTOZA) 18 MG/3ML SOPN Inject 1.2 mg into the skin every morning.    [provider]  lisinopril-hydrochlorothiazide (PRINZIDE,ZESTORETIC) 10-12.5 MG tablet Take 1 tablet by mouth at bedtime.    [provider]  metFORMIN (GLUCOPHAGE) 1000 MG tablet Take 1,000 mg by mouth at bedtime.    [provider]  metoprolol succinate (TOPROL-XL) 25 MG 24 hr tablet  05/09/17   [provider]  omeprazole (PRILOSEC) 40 MG capsule Take 40 mg by mouth at bedtime. 01/02/14   [provider]  pravastatin (PRAVACHOL) 20 MG tablet Take 10 mg by mouth at bedtime.    [provider]    Allergies    Kiwi extract, Iodine, and Shrimp [shellfish  allergy]  Review of Systems   Review of Systems  Constitutional:  Negative for chills and fever.  Eyes:  Negative for pain and visual disturbance.  Respiratory:  Negative for cough and shortness of breath.   Cardiovascular:  Negative for chest pain and palpitations.  Gastrointestinal:  Positive for nausea. Negative for abdominal pain and vomiting.  Genitourinary:  Negative for dysuria and hematuria.  Musculoskeletal:  Negative for arthralgias and back pain.  Skin:  Negative for color change and rash.  Neurological:  Positive for weakness, light-headedness and headaches. Negative for seizures, syncope, facial asymmetry, speech difficulty and numbness.  All other systems reviewed and are negative.  Physical Exam Updated Vital Signs BP (!) 142/84   Pulse 60   Temp 97.6 F (36.4 C) (Oral)   Resp 19   SpO2 100%   Physical Exam Constitutional:      General: She is not in acute distress. HENT:     Head: Normocephalic and atraumatic.  Eyes:     Conjunctiva/sclera: Conjunctivae normal.     Pupils: Pupils are equal, round, and reactive to light.  Cardiovascular:     Rate and Rhythm: Normal rate and regular rhythm.  Pulmonary:     Effort: Pulmonary effort is normal. No respiratory distress.  Abdominal:     General: There is no distension.     Tenderness: There is no abdominal tenderness.  Skin:    General: Skin is warm and dry.  Neurological:     General: No focal deficit present.     Mental Status: She is alert and oriented to person, place, and time. Mental status is at baseline.     GCS: GCS eye subscore is 4. GCS verbal subscore is 5. GCS motor subscore is 6.     Cranial Nerves: Cranial nerves are intact.     Sensory: No sensory deficit.     Motor: No weakness.     Coordination: Finger-Nose-Finger Test normal.  Psychiatric:        Mood and Affect: Mood normal.        Behavior: Behavior normal.    ED Results / Procedures / Treatments   Labs (all labs ordered are  listed, but only abnormal results are displayed) Labs Reviewed  DIFFERENTIAL - Abnormal; Notable for the following components:      Result Value   Lymphs Abs 4.5 (*)    All other components within normal limits  COMPREHENSIVE METABOLIC PANEL - Abnormal; Notable for the following components:   Creatinine, Ser 1.21 (*)    GFR, Estimated 47 (*)    All other components within normal limits  I-STAT CHEM 8, ED - Abnormal; Notable for the following components:   Creatinine, Ser 1.20 (*)    All other components within  normal limits  RESP PANEL BY RT-PCR (FLU A&B, COVID) ARPGX2  ETHANOL  PROTIME-INR  APTT  CBC  RAPID URINE DRUG SCREEN, HOSP PERFORMED  URINALYSIS, ROUTINE W REFLEX MICROSCOPIC  CBG MONITORING, ED  TROPONIN I (HIGH SENSITIVITY)    EKG EKG Interpretation  Date/Time:  Sunday August 28 2021 19:42:59 EDT Ventricular Rate:  60 PR Interval:  68 QRS Duration: 156 QT Interval:  454 QTC Calculation: 454 R Axis:   -73 Text Interpretation: V paced rhythm, does not meet sgarbossa criteria, similar to prior tracing Confirmed by Alvester Chou [phone]) on 08/28/2021 9:29:34 PM  Radiology CT HEAD CODE STROKE WO CONTRAST  Result Date: 08/28/2021 CLINICAL DATA:  Code stroke.  Neuro deficit, acute, stroke suspected EXAM: CT HEAD WITHOUT CONTRAST TECHNIQUE: Contiguous axial images were obtained from the base of the skull through the vertex without intravenous contrast. COMPARISON:  Multiple priors, most recent October 06, 2016. FINDINGS: Brain: No evidence of acute large vascular territory infarction, hemorrhage, hydrocephalus, extra-axial collection or mass lesion/mass effect. Remote infarct in the posterior limb of the left internal capsule. Additional mild patchy white matter hypoattenuation, nonspecific but compatible with chronic microvascular ischemic disease. Vascular: No hyperdense vessel identified. Skull: No acute fracture. Sinuses/Orbits: Clear visualized sinuses. No acute  orbital findings. Other: No mastoid effusions. ASPECTS [facility] Stroke Program Early CT Score) total score (0-10 with 10 being normal): 10. IMPRESSION: 1. No evidence of acute large vascular territory infarct or acute hemorrhage. ASPECTS is 10. 2. Remote infarct in the posterior limb of the left internal capsule. 3. Mild chronic microvascular ischemic disease. 4. Code stroke imaging results were communicated on 08/28/2021 at 7:06 pm to provider Dr. Otelia Limes via secure text paging. Electronically Signed   By: Feliberto Harts M.D.   On: 08/28/2021 19:08   CT ANGIO HEAD CODE STROKE  Result Date: 08/28/2021 CLINICAL DATA:  Neuro deficit, acute, stroke suspected EXAM: CT ANGIOGRAPHY HEAD AND NECK TECHNIQUE: Multidetector CT imaging of the head and neck was performed using the standard protocol during bolus administration of intravenous contrast. Multiplanar CT image reconstructions and MIPs were obtained to evaluate the vascular anatomy. Carotid stenosis measurements (when applicable) are obtained utilizing NASCET criteria, using the distal internal carotid diameter as the denominator. CONTRAST:  24mL OMNIPAQUE IOHEXOL 350 MG/ML SOLN COMPARISON:  None. 09/05/2016. FINDINGS: CTA NECK FINDINGS Aortic arch: Atherosclerosis of the aorta. Great vessel origins are patent. Right carotid system: Ulcerated atherosclerosis at the carotid bifurcation without greater than 50% stenosis, similar to prior. Left carotid system: Ulcerated atherosclerosis at the carotid bifurcation without greater than 50% stenosis, similar to prior. Vertebral arteries: Mild proximal vertebral arteries narrowing bilaterally, similar to prior. No significant (greater than 50%) stenosis. Codominant. Skeleton: No acute osseous abnormality identified. Degenerative change in the visualized thoracic spine. Other neck: Enlarged, heterogeneous thyroid with multiple nodules, measuring up to 2 cm anteriorly. Upper chest: Paraseptal emphysema. No consolidation  in the visualized lung apices. Review of the MIP images confirms the above findings CTA HEAD FINDINGS Anterior circulation: Bilateral intracranial ICAs are patent with similar up to moderate stenosis bilaterally due to calcific atherosclerosis. Bilateral MCAs and ACAs are patent without proximal hemodynamically significant stenosis. No aneurysm identified. Posterior circulation: Save draft bilateral intradural vertebral arteries, basilar artery, and posterior cerebral arteries are patent. Similar moderate left P2 PCA stenosis. Bilateral PICAs are patent. No aneurysm identified. Venous sinuses: As permitted by contrast timing, patent. Anatomic variants: None. Review of the MIP images confirms the above findings IMPRESSION: 1. No large vessel occlusion.  2. Similar moderate left P2 PCA stenosis. 3. Similar moderate bilateral intracranial ICA stenosis. 4. Similar bilateral carotid bifurcation atherosclerosis without greater than 50% stenosis. 5. Similar mild proximal vertebral artery stenosis bilaterally. 6. Enlarged and heterogeneous thyroid with multiple nodules, measuring up to 2 cm anteriorly. Recommend thyroid US (Ref: J Am Coll Radiol. 2015 Feb;12(2): 143-50). 7. Aortic Atherosclerosis (ICD10-I70.0) and Emphysema (ICD10-J43.9). Electronically Signed   By: Feliberto Harts M.D.   On: 08/28/2021 19:42   CT ANGIO NECK CODE STROKE  Result Date: 08/28/2021 CLINICAL DATA:  Neuro deficit, acute, stroke suspected EXAM: CT ANGIOGRAPHY HEAD AND NECK TECHNIQUE: Multidetector CT imaging of the head and neck was performed using the standard protocol during bolus administration of intravenous contrast. Multiplanar CT image reconstructions and MIPs were obtained to evaluate the vascular anatomy. Carotid stenosis measurements (when applicable) are obtained utilizing NASCET criteria, using the distal internal carotid diameter as the denominator. CONTRAST:  75mL OMNIPAQUE IOHEXOL 350 MG/ML SOLN COMPARISON:  None. 09/05/2016.  FINDINGS: CTA NECK FINDINGS Aortic arch: Atherosclerosis of the aorta. Great vessel origins are patent. Right carotid system: Ulcerated atherosclerosis at the carotid bifurcation without greater than 50% stenosis, similar to prior. Left carotid system: Ulcerated atherosclerosis at the carotid bifurcation without greater than 50% stenosis, similar to prior. Vertebral arteries: Mild proximal vertebral arteries narrowing bilaterally, similar to prior. No significant (greater than 50%) stenosis. Codominant. Skeleton: No acute osseous abnormality identified. Degenerative change in the visualized thoracic spine. Other neck: Enlarged, heterogeneous thyroid with multiple nodules, measuring up to 2 cm anteriorly. Upper chest: Paraseptal emphysema. No consolidation in the visualized lung apices. Review of the MIP images confirms the above findings CTA HEAD FINDINGS Anterior circulation: Bilateral intracranial ICAs are patent with similar up to moderate stenosis bilaterally due to calcific atherosclerosis. Bilateral MCAs and ACAs are patent without proximal hemodynamically significant stenosis. No aneurysm identified. Posterior circulation: Save draft bilateral intradural vertebral arteries, basilar artery, and posterior cerebral arteries are patent. Similar moderate left P2 PCA stenosis. Bilateral PICAs are patent. No aneurysm identified. Venous sinuses: As permitted by contrast timing, patent. Anatomic variants: None. Review of the MIP images confirms the above findings IMPRESSION: 1. No large vessel occlusion. 2. Similar moderate left P2 PCA stenosis. 3. Similar moderate bilateral intracranial ICA stenosis. 4. Similar bilateral carotid bifurcation atherosclerosis without greater than 50% stenosis. 5. Similar mild proximal vertebral artery stenosis bilaterally. 6. Enlarged and heterogeneous thyroid with multiple nodules, measuring up to 2 cm anteriorly. Recommend thyroid US (Ref: J Am Coll Radiol. 2015 Feb;12(2): 143-50). 7.  Aortic Atherosclerosis (ICD10-I70.0) and Emphysema (ICD10-J43.9). Electronically Signed   By: Feliberto Harts M.D.   On: 08/28/2021 19:42    Procedures Procedures   Medications Ordered in ED Medications  iohexol (OMNIPAQUE) 350 MG/ML injection 75 mL (75 mLs Intravenous Contrast Given 08/28/21 1923)    ED Course  I have reviewed the triage vital signs and the nursing notes.  Pertinent labs & imaging results that were available during my care of the patient were reviewed by me and considered in my medical decision making (see chart for details).  Patient arrives as a code stroke, airway assessed on arrival and intact.  Neurology also present on arrival as patient is taken to CT.  She has a benign neurological exam, no focal deficits, we will need to evaluate ambulation after patient complete CT.  No evidence of large vessel occlusion.  The patient is compliant with her Eliquis.  Differential diagnosis also includes infection including COVID versus UTI versus dehydration  versus other.  She reports that she essentially has weakness with trying to stand up in her legs.  No reported back pain or recent falls or trauma to the spine to suggest new spinal cord injury.  Her strength and sensation are intact in the lower extremities.  I personally reviewed her labs and prior medical records today including her 2017 stroke imaging  I reviewed her labs today, glucose normal.  Troponin unremarkable.  No evidence of dehydration.  No signs of sepsis.  No signs of acute anemia  Clinical Course as of 08/28/21 2213  Sun Aug 28, 2021  2145 Patient reevaluated.  Her neuro exam is benign except she is not able to ambulate steadily on her own.  When I attempt to stand her up, with assistance, she has a wide ataxic gait and unsteady legs.  I seated her back down. [MT]  2145 Per my discussion with the neurologist Dr. Otelia Limes, we will bring the patient to the hospital overnight, she will need a pacemaker  compatible MRI in the morning to complete stroke evaluation.  Infection (UTI/Covid) also remain on the differential with 1 week of fatigue.  I explained this to the patient verbalized agreement. [MT]    Clinical Course User Index [MT] Elenie Coven, Kermit Balo, MD    Final Clinical Impression(s) / ED Diagnoses Final diagnoses:  Ataxia    Rx / DC Orders ED Discharge Orders     None        Terald Sleeper, MD 08/28/21 2214

## 2021-08-28 NOTE — ED Triage Notes (Signed)
Pt BIB [facility] from home after patient called reporting sudden onset weakness in legs after a week of feeling tired. Patient LKW at 1700 and woke up from nap at 1730 unable to get off toilet after walking to it. Patient has hx of stroke manifesting with similar symptoms. Patient alert and oriented x4 at this time and reports no pain.

## 2021-08-28 NOTE — Consult Note (Addendum)
NEURO HOSPITALIST CONSULT NOTE   Requestig physician: Dr. Renaye Rakers  Reason for Consult:Acute onset of gait instability  History obtained from:   Patient, EMS and Chart     HPI:                                                                                                                                          Claire Mcgrath is an 75 y.o. female with a PMHx of CHF, DM, HLD, HTN, stroke, heart block s/p pacemaker, paroxysmal atrial fibrillation on Eliquis who presents to the ED via EMS after acute onset of BLE weakness and incoordination. LKN was 5 PM when she took a nap. On awakening at 5:30 PM she had difficulty standing up due to sensation of weak legs with incoordination. She also endorses lightheadedness but when she took her BP it was elevated with SBP in 170s. She felt that the above symptoms were very similar to those of her prior stroke, so EMS was called. On arrival to the ED she endorsed continued BLE weakness but resolution of the lightheadedness. No speech deficit, vision problems or sensory numbness. Endorses intermittent headache for the past week. Also with fatigue and some nausea earlier today.   Per EMS, initial BP was 200/100, CBG 88, O2 sats normal.  IV thrombolytic not administered due to DOAC use with last dose yesterday.   Past Medical History:  Diagnosis Date   Asthma    CHF (congestive heart failure) (HCC)    Diabetes mellitus without complication (HCC)    Heart block    Hyperlipidemia    Hypertension    Stroke [facility])     Past Surgical History:  Procedure Laterality Date   ABDOMINAL HYSTERECTOMY     BREAST LUMPECTOMY     left   EP IMPLANTABLE DEVICE     PACEMAKER INSERTION      Family History  Problem Relation Age of Onset   Congestive Heart Failure Mother    Heart attack Father    Stroke Maternal Aunt              Social History:  reports that she has been smoking cigarettes. She has been smoking an average of .5 packs per day.  She has never used smokeless tobacco. She reports current alcohol use. She reports that she does not use drugs.  Allergies  Allergen Reactions   Kiwi Extract Anaphylaxis    Feel my throat close up   Iodine Itching and Swelling        Shrimp [Shellfish Allergy] Itching and Swelling    HOME MEDICATIONS:  No current facility-administered medications on file prior to encounter.   Current Outpatient Medications on File Prior to Encounter  Medication Sig Dispense Refill   doxycycline (VIBRAMYCIN) 100 MG capsule Take 1 capsule (100 mg total) by mouth 2 (two) times daily. 20 capsule 0   ELIQUIS 5 MG TABS tablet Take 5 mg by mouth 2 (two) times daily.      fluticasone furoate-vilanterol (BREO ELLIPTA) 100-25 MCG/INH AEPB Inhale 1 puff into the lungs daily as needed.     Insulin Lispro Prot & Lispro (HUMALOG MIX 75/25 KWIKPEN) (75-25) 100 UNIT/ML Kwikpen Inject 20 Units into the skin 2 (two) times daily. Take 20 units in the am and 12 in the pm     Liraglutide (VICTOZA) 18 MG/3ML SOPN Inject 1.2 mg into the skin every morning.     lisinopril-hydrochlorothiazide (PRINZIDE,ZESTORETIC) 10-12.5 MG tablet Take 1 tablet by mouth at bedtime.     metFORMIN (GLUCOPHAGE) 1000 MG tablet Take 1,000 mg by mouth at bedtime.     metoprolol succinate (TOPROL-XL) 25 MG 24 hr tablet      omeprazole (PRILOSEC) 40 MG capsule Take 40 mg by mouth at bedtime.     pravastatin (PRAVACHOL) 20 MG tablet Take 10 mg by mouth at bedtime.      ROS:                                                                                                                                       As per HPI.   There were no vitals taken for this visit.  General Examination:                                                                                                       Physical Exam  HEENT-  [place]/AT    Lungs-  Respirations unlabored Extremities- No edema  Neurological Examination Mental Status: Awake and alert. Oriented x 5. Speech fluent with intact naming for all items given and comprehension for all basic commands. Repetition intact. Forgot the last portion of a 3-step directional command. No dysarthria.  Cranial Nerves: II: Temporal visual fields intact with no extinction to DSS. PERRL  III,IV, VI: ptosis not present, extra-ocular motions intact bilaterally  V: Temp sensation equal bilaterally.  VII: Smile symmetric. Cheek puffing normal.  VIII: hearing normal bilaterally IX,X: No hypophonia XI: Symmetric XII: midline tongue extension Motor: RUE with intermittent complete apparent loss of tone, dropping suddenly to the bed. On other trials able to hold antigravity  for > 10 seconds with no drift and with 5/5 strength proximally and distally.  LUE 5/5 proximally and distally. No drift RLE 5/5 strength proximally and distally for short durations, but also with intermittent giveway weakness on some trials. Able to hold antigravity > 5 seconds when coached on some trials; with other trials has complete loss of tone and dropping suddenly to the bed. RLE on several occasions sliding laterally off CT table and hanging flaccid at side, then tone regained when placed back on table and coached to resist examiner.  LLE 5/5 proximally and distally with most trials. Some giveway occurs that resolves with repeated coaching.  Bulk is normal and symmetric x 4.  Sensory: Temp and light touch intact throughout, bilaterally. No extinction to DSS.  Deep Tendon Reflexes: 2+ and symmetric bilateral upper extremities. Trace patellar reflexes bilaterally. Absent achilles reflexes bilaterally.  Plantars:  Right: downgoing   Left: downgoing Cerebellar:  RUE: No ataxia with FNF and normal RAM.  LUE: Initially with subtle ataxic movement on FNF that then resolves. Dysdiadochokinesia with RAM. RLE: Ataxic heel-shin LLE:  Normal heel-shin (Note crossed cerebellar findings above) Gait: Deferred due to falls risk concerns   Lab Results: Basic Metabolic Panel: Recent Labs  Lab 08/28/21 1856  NA 142  K 3.9  CL 106  GLUCOSE 87  BUN 22  CREATININE 1.20*    CBC: Recent Labs  Lab 08/28/21 1856  HGB 13.3  HCT 39.0    Cardiac Enzymes: No results for input(s): CKTOTAL, CKMB, CKMBINDEX, TROPONINI in the last 168 hours.  Lipid Panel: No results for input(s): CHOL, TRIG, HDL, CHOLHDL, VLDL, LDLCALC in the last 168 hours.  Imaging: No results found.   Assessment: 75 year old female with a history of atrial fibrillation, on DOAC, presenting with acute onset of gait instability.  1. Exam reveals unusual crossed cerebellar findings and giveway weakness as well as erratic movements along with intermittent flaccidity of RLE while on the CT scanning table that resolved or improved significantly with coaching. DDx includes psychogenic pseudostroke and unusual manifestations of acute small multifocal ischemic infarctions. 2. CT head: No acute abnormality. Remote infarct in the posterior limb of the left internal capsule. Mild chronic microvascular ischemic disease. 3. CTA: Bilateral vertebral arteries and basilar artery are patent. Irregularity of left P2 PCA that was present on prior scan obtained in 2017. Bilateral PICAs are seen. Anteriorly, no LVO is seen.  4. Not a TNK candidate due to DOAC use 5. Not a candidate for thrombectomy due to no LVO.   Recommendations: 1. HgbA1c, fasting lipid panel 2. MRI of the brain without contrast on Monday if pacemaker is MRI compatible 3. PT consult, OT consult, Speech consult 4. TTE 5. Statin 6. Continue Eliquis. Exam findings not consistent with a large territory infarction. Low risk of hemorrhagic complications.  7. Risk factor modification 8. Telemetry monitoring 9. Frequent neuro checks 10 NPO until passes stroke swallow screen   Electronically signed: Dr.  Caryl Pina 08/28/2021, 7:09 PM

## 2021-08-28 NOTE — H&P (Signed)
History and Physical    Claire SeatSylvia C Mcgrath WGN:[id number] DOB: 1946/07/03 DOA: 08/28/2021  PCP: Ralene OkMoreira, Roy, MD (Confirm with patient/family/NH records and if not entered, this has to [facility]) Patient coming from: home  I have personally briefly reviewed patient's [facility]  Chief Complaint: LE weakness, unable to walk  HPI: Claire SeatSylvia C Mcgrath is a 75 y.o. female with medical history significant of  cerebrovascular accident, heart block, s/p pacemaker, PAF on Eliquis, chronic heart failure presenting to emergency department with leg weakness and  incoordination.  Patient reports he took a nap around 5 PM, woke up around 5:30 PM, and is having difficulty standing up.  She feels like both of her legs are weak and discoordinated.  She describes some lightheadedness as well.  She reports the symptoms are very similar to when she had her prior stroke.  She called EMS who activated code stroke based on this history.  At time she arrived in the ER she denies lightheadedness, she continues to feel weak in her legs.  She denies numbness.  She denies difficulty speaking.  She reports she has had intermittent headache for a week and felt unusually fatigued.  She also reports she has some nausea earlier today which resolved.   ED Course: T 97.6  137/47  HR 63 paced, RR 17. EDP exam - non-focal neuro exam. Cmet: Cr 1.21, Troponbin #1 11. CTA Head/neck - no LVO, similar  left P2 PCA stenosis, bilateral intracranial ICA stenosis, bilateral carotid stenosis CT head w/o acute infarct, hemorrhage, old pesterior Left internal capsule infarct. Dr. Wilford CornerArora spoke with EDP - recommended admission for MRI pacemaker compatible in AM. Covid testing pending with possible symptoms of fatigue, myalgias, weakness. TR[facility] called to admit for continued evaluation.  Review of Systems: As per HPI otherwise 10 point review of systems negative. Patient does report increasing fatigue and  decreased stamina over the past months but worse recently.    Past Medical History:  Diagnosis Date   Asthma    CHF (congestive heart failure) (HCC)    Diabetes mellitus without complication (HCC)    Heart block    Hyperlipidemia    Hypertension    Stroke [facility](HCC)     Past Surgical History:  Procedure Laterality Date   ABDOMINAL HYSTERECTOMY     BREAST LUMPECTOMY     left   EP IMPLANTABLE DEVICE     PACEMAKER INSERTION      Soc Hx - 1st marriage 2 years; 2nd marriage 35 years. She had no children but has 3 step-children. 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 She lives with her husband who has had a MI and she is the primary caretaker.   reports that she has been smoking cigarettes. She has been smoking an average of .5 packs per day. She has never used smokeless tobacco. She reports current alcohol use. She reports that she does not use drugs.  Allergies  Allergen Reactions   Kiwi Extract Anaphylaxis    Feel my throat close up   Iodine Itching and Swelling        Shrimp [Shellfish Allergy] Itching and Swelling    Family History  Problem Relation Age of Onset   Congestive Heart Failure Mother    Heart attack Father    Stroke Maternal Aunt      Prior to Admission medications   Medication Sig Start Date End Date Taking? Authorizing Provider  doxycycline (  VIBRAMYCIN) 100 MG capsule Take 1 capsule (100 mg total) by mouth 2 (two) times daily. 09/03/20   Burky, Barron Alvine, NP  ELIQUIS 5 MG TABS tablet Take 5 mg by mouth 2 (two) times daily.  05/15/17   [provider]  fluticasone furoate-vilanterol (BREO ELLIPTA) 100-25 MCG/INH AEPB Inhale 1 puff into the lungs daily as needed.    [provider]  Insulin Lispro Prot & Lispro (HUMALOG MIX 75/25 KWIKPEN) (75-25) 100 UNIT/ML Kwikpen Inject 20 Units into the skin 2 (two) times daily. Take 20 units in the am and 12 in the pm    [provider]  Liraglutide (VICTOZA) 18 MG/3ML SOPN  Inject 1.2 mg into the skin every morning.    [provider]  lisinopril-hydrochlorothiazide (PRINZIDE,ZESTORETIC) 10-12.5 MG tablet Take 1 tablet by mouth at bedtime.    [provider]  metFORMIN (GLUCOPHAGE) 1000 MG tablet Take 1,000 mg by mouth at bedtime.    [provider]  metoprolol succinate (TOPROL-XL) 25 MG 24 hr tablet  05/09/17   [provider]  omeprazole (PRILOSEC) 40 MG capsule Take 40 mg by mouth at bedtime. 01/02/14   [provider]  pravastatin (PRAVACHOL) 20 MG tablet Take 10 mg by mouth at bedtime.    [provider]    Physical Exam: Vitals:   08/28/21 2100 08/28/21 2148 08/28/21 2230 08/28/21 2310  BP: (!) 142/84  (!) 137/47   Pulse: 60  63 60  Resp: Temp:  97.6 F (36.4 C)    TempSrc:  Oral    SpO2: 100%  97% 98%    Cons Vitals:   08/28/21 2100 08/28/21 2148 08/28/21 2230 08/28/21 2310  BP: (!) 142/84  (!) 137/47   Pulse: 60  63 60  Resp: Temp:  97.6 F (36.4 C)    TempSrc:  Oral    SpO2: 100%  97% 98%   General: WNWD older woman in no distress. Eyes: PERRL, lids and conjunctivae normal ENMT: Mucous membranes are moist. Posterior pharynx clear of any exudate or lesions.Normal dentition.  Neck: normal, supple, no masses, no thyromegaly Respiratory: clear to auscultation bilaterally, no wheezing, no crackles. Normal respiratory effort. No accessory muscle use.  Chest: pacemaker left upper chest. Cardiovascular: Regular rate and rhythm, no murmurs / rubs / gallops. No extremity edema. 2+ pedal pulses. No carotid bruits.  Abdomen: obese, no tenderness, no masses palpated. No hepatosplenomegaly. Bowel sounds positive.  Musculoskeletal: no clubbing / cyanosis. No joint deformity upper and lower extremities. Good ROM, no contractures. Normal muscle tone.  Skin: no rashes, lesions, ulcers. No induration Neurologic: CN 2-12: nl facial symmetry and movement, PERRLA, EOMI, no tongue  fasciulations, nl shoulder shrug.  Sensation intact to light touch, DTR normal bicep and patellar tendons. Strength 5/5 in all 4.  Psychiatric: Normal judgment and insight. Alert and oriented x 3. Normal mood.     Labs on Admission: I have personally reviewed following labs and imaging studies  CBC: Recent Labs  Lab 08/28/21 1851 08/28/21 1856  WBC 10.2  --   NEUTROABS 4.6  --   HGB 12.4 13.3  HCT 39.1 39.0  MCV 87.7  --   PLT 274  --    Basic Metabolic Panel: Recent Labs  Lab 08/28/21 1851 08/28/21 1856  NA 139 142  K 3.9 3.9  CL 107 106  CO2 23  --   GLUCOSE 86 87  BUN 21 22  CREATININE 1.21* 1.20*  CALCIUM 9.5  --    GFR: CrCl cannot be calculated (Unknown ideal weight.). Liver Function Tests: Recent Labs  Lab 08/28/21 1851  AST 18  ALT 15  ALKPHOS 46  BILITOT 0.6  PROT 8.0  ALBUMIN 3.8   No results for input(s): LIPASE, AMYLASE in the last 168 hours. No results for input(s): AMMONIA in the last 168 hours. Coagulation Profile: Recent Labs  Lab 08/28/21 1851  INR 1.1   Cardiac Enzymes: No results for input(s): CKTOTAL, CKMB, CKMBINDEX, TROPONINI in the last 168 hours. BNP (last 3 results) No results for input(s): PROBNP in the last 8760 hours. HbA1C: No results for input(s): HGBA1C in the last 72 hours. CBG: Recent Labs  Lab 08/28/21 1849  GLUCAP 85   Lipid Profile: No results for input(s): CHOL, HDL, LDLCALC, TRIG, CHOLHDL, LDLDIRECT in the last 72 hours. Thyroid Function Tests: No results for input(s): TSH, T4TOTAL, FREET4, T3FREE, THYROIDAB in the last 72 hours. Anemia Panel: No results for input(s): VITAMINB12, FOLATE, FERRITIN, TIBC, IRON, RETICCTPCT in the last 72 hours. Urine analysis:    Component Value Date/Time   COLORURINE YELLOW 10/06/2016 0528   APPEARANCEUR CLOUDY (A) 10/06/2016 0528   LABSPEC 1.004 (L) 10/06/2016 0528   PHURINE 5.5 10/06/2016 0528   GLUCOSEU NEGATIVE 10/06/2016 0528   HGBUR NEGATIVE 10/06/2016 0528    BILIRUBINUR NEGATIVE 10/06/2016 0528   KETONESUR NEGATIVE 10/06/2016 0528   PROTEINUR NEGATIVE 10/06/2016 0528   NITRITE NEGATIVE 10/06/2016 0528   LEUKOCYTESUR NEGATIVE 10/06/2016 0528    Radiological Exams on Admission: CT HEAD CODE STROKE WO CONTRAST  Result Date: 08/28/2021 CLINICAL DATA:  Code stroke.  Neuro deficit, acute, stroke suspected EXAM: CT HEAD WITHOUT CONTRAST TECHNIQUE: Contiguous axial images were obtained from the base of the skull through the vertex without intravenous contrast. COMPARISON:  Multiple priors, most recent October 06, 2016. FINDINGS: Brain: No evidence of acute large vascular territory infarction, hemorrhage, hydrocephalus, extra-axial collection or mass lesion/mass effect. Remote infarct in the posterior limb of the left internal capsule. Additional mild patchy white matter hypoattenuation, nonspecific but compatible with chronic microvascular ischemic disease. Vascular: No hyperdense vessel identified. Skull: No acute fracture. Sinuses/Orbits: Clear visualized sinuses. No acute orbital findings. Other: No mastoid effusions. ASPECTS [facility] Stroke Program Early CT Score) total score (0-10 with 10 being normal): 10. IMPRESSION: 1. No evidence of acute large vascular territory infarct or acute hemorrhage. ASPECTS is 10. 2. Remote infarct in the posterior limb of the left internal capsule. 3. Mild chronic microvascular ischemic disease. 4. Code stroke imaging results were communicated on 08/28/2021 at 7:06 pm to provider Dr. Otelia Limes via secure text paging. Electronically Signed   By: Feliberto Harts M.D.   On: 08/28/2021 19:08   CT ANGIO HEAD CODE STROKE  Result Date: 08/28/2021 CLINICAL DATA:  Neuro deficit, acute, stroke suspected EXAM: CT ANGIOGRAPHY HEAD AND NECK TECHNIQUE: Multidetector CT imaging of the head and neck was performed using the standard protocol during bolus administration of intravenous contrast. Multiplanar CT image reconstructions and MIPs were  obtained to evaluate the vascular anatomy. Carotid stenosis measurements (when applicable) are obtained utilizing NASCET criteria, using the distal internal carotid diameter as the denominator. CONTRAST:  81mL OMNIPAQUE IOHEXOL 350 MG/ML SOLN COMPARISON:  None. 09/05/2016. FINDINGS: CTA NECK FINDINGS Aortic arch: Atherosclerosis of the aorta. Great vessel origins are patent. Right carotid system: Ulcerated atherosclerosis at the carotid bifurcation without greater than 50% stenosis, similar to prior. Left carotid system: Ulcerated atherosclerosis at the carotid  bifurcation without greater than 50% stenosis, similar to prior. Vertebral arteries: Mild proximal vertebral arteries narrowing bilaterally, similar to prior. No significant (greater than 50%) stenosis. Codominant. Skeleton: No acute osseous abnormality identified. Degenerative change in the visualized thoracic spine. Other neck: Enlarged, heterogeneous thyroid with multiple nodules, measuring up to 2 cm anteriorly. Upper chest: Paraseptal emphysema. No consolidation in the visualized lung apices. Review of the MIP images confirms the above findings CTA HEAD FINDINGS Anterior circulation: Bilateral intracranial ICAs are patent with similar up to moderate stenosis bilaterally due to calcific atherosclerosis. Bilateral MCAs and ACAs are patent without proximal hemodynamically significant stenosis. No aneurysm identified. Posterior circulation: Save draft bilateral intradural vertebral arteries, basilar artery, and posterior cerebral arteries are patent. Similar moderate left P2 PCA stenosis. Bilateral PICAs are patent. No aneurysm identified. Venous sinuses: As permitted by contrast timing, patent. Anatomic variants: None. Review of the MIP images confirms the above findings IMPRESSION: 1. No large vessel occlusion. 2. Similar moderate left P2 PCA stenosis. 3. Similar moderate bilateral intracranial ICA stenosis. 4. Similar bilateral carotid bifurcation  atherosclerosis without greater than 50% stenosis. 5. Similar mild proximal vertebral artery stenosis bilaterally. 6. Enlarged and heterogeneous thyroid with multiple nodules, measuring up to 2 cm anteriorly. Recommend thyroid US (Ref: J Am Coll Radiol. 2015 Feb;12(2): 143-50). 7. Aortic Atherosclerosis (ICD10-I70.0) and Emphysema (ICD10-J43.9). Electronically Signed   By: Feliberto Harts M.D.   On: 08/28/2021 19:42   CT ANGIO NECK CODE STROKE  Result Date: 08/28/2021 CLINICAL DATA:  Neuro deficit, acute, stroke suspected EXAM: CT ANGIOGRAPHY HEAD AND NECK TECHNIQUE: Multidetector CT imaging of the head and neck was performed using the standard protocol during bolus administration of intravenous contrast. Multiplanar CT image reconstructions and MIPs were obtained to evaluate the vascular anatomy. Carotid stenosis measurements (when applicable) are obtained utilizing NASCET criteria, using the distal internal carotid diameter as the denominator. CONTRAST:  71mL OMNIPAQUE IOHEXOL 350 MG/ML SOLN COMPARISON:  None. 09/05/2016. FINDINGS: CTA NECK FINDINGS Aortic arch: Atherosclerosis of the aorta. Great vessel origins are patent. Right carotid system: Ulcerated atherosclerosis at the carotid bifurcation without greater than 50% stenosis, similar to prior. Left carotid system: Ulcerated atherosclerosis at the carotid bifurcation without greater than 50% stenosis, similar to prior. Vertebral arteries: Mild proximal vertebral arteries narrowing bilaterally, similar to prior. No significant (greater than 50%) stenosis. Codominant. Skeleton: No acute osseous abnormality identified. Degenerative change in the visualized thoracic spine. Other neck: Enlarged, heterogeneous thyroid with multiple nodules, measuring up to 2 cm anteriorly. Upper chest: Paraseptal emphysema. No consolidation in the visualized lung apices. Review of the MIP images confirms the above findings CTA HEAD FINDINGS Anterior circulation: Bilateral  intracranial ICAs are patent with similar up to moderate stenosis bilaterally due to calcific atherosclerosis. Bilateral MCAs and ACAs are patent without proximal hemodynamically significant stenosis. No aneurysm identified. Posterior circulation: Save draft bilateral intradural vertebral arteries, basilar artery, and posterior cerebral arteries are patent. Similar moderate left P2 PCA stenosis. Bilateral PICAs are patent. No aneurysm identified. Venous sinuses: As permitted by contrast timing, patent. Anatomic variants: None. Review of the MIP images confirms the above findings IMPRESSION: 1. No large vessel occlusion. 2. Similar moderate left P2 PCA stenosis. 3. Similar moderate bilateral intracranial ICA stenosis. 4. Similar bilateral carotid bifurcation atherosclerosis without greater than 50% stenosis. 5. Similar mild proximal vertebral artery stenosis bilaterally. 6. Enlarged and heterogeneous thyroid with multiple nodules, measuring up to 2 cm anteriorly. Recommend thyroid US (Ref: J Am Coll Radiol. 2015 Feb;12(2): 143-50). 7. Aortic Atherosclerosis (  ICD10-I70.0) and Emphysema (ICD10-J43.9). Electronically Signed   By: Feliberto Harts M.D.   On: 08/28/2021 19:42    EKG: Independently reviewed. Ventricularly paced rhythm, no acute changes  Assessment/Plan Active Problems:   Acute ischemic stroke (HCC)   DM2 (diabetes mellitus, type 2) (HCC)   HTN (hypertension)   CHF (congestive heart failure) (HCC)   Asthma   History of complete heart block     Acute CVA - patient with h/o CVA. Also h/o PAF but now paced. She has been taking eliquis BID. She experience ataxia and incoordination in the late afternoon. CT head negative for acute findings. CTA head/neck w/o LVO and with similar findings to prior study. Non-focal exam. She is fully vaccinated (4 shots) against covid but has worrisome symptoms. Plan Med-tele admit obs.  Continue eliquis  Neuro protocol orders  MRI compatible with PCM in  AM  2. DM2- continue home regimen plus SS  3. Chronic CHF - well compensated. Will continue home meds  4. HTN transiently hypertensive during transport now normal Plan Continue home meds  5. Asthma - stable. No wheezing.   DVT prophylaxis: eliquis  Code Status: full code  Family Communication: patient asked that her husband, who is HOH, not be called. She had spoken with him and he knows she will stay in hospital.   Disposition Plan: home 24-48 hrs  Consults called: Neuro - Dr. Wilford Corner called by EDP  Admission status: obs-tele    Illene Regulus MD [facility] Pager [phone]  If 7PM-7AM, please contact night-coverage [email or web address] Password [facility]  08/28/2021, 11:23 PM

## 2021-08-28 NOTE — Code Documentation (Addendum)
Stroke Response Nurse Documentation Code Documentation  Phelan Goers Carew is a 75 y.o. female arriving to [facility] ED via [facility] on 9/11  with past medical hx of Left PCA CVA 2017, heart block with pacemaker.. On Eliquis (apixaban) daily. Code stroke was activated by EMS.   Patient from home where she was LKW at 1700 and now complaining of gait disturbance.   Stroke team at the bedside on patient arrival. Labs drawn and patient cleared for CT by Dr. Renaye Rakers. Patient to CT with team. NIHSS 0, see documentation for details and code stroke times. Patient with no deficits on exam. The following imaging was completed:  CT, CTA head and neck. Patient is not a candidate for IV Thrombolytic due to Eliquis. Patient is not a candidate for IR due to No LVO.   Bedside handoff with ED RN Paden.    Rose Fillers  Rapid Response RN

## 2021-08-29 ENCOUNTER — Observation Stay (HOSPITAL_COMMUNITY): Payer: Medicare Other

## 2021-08-29 ENCOUNTER — Observation Stay (HOSPITAL_BASED_OUTPATIENT_CLINIC_OR_DEPARTMENT_OTHER): Payer: Medicare Other

## 2021-08-29 DIAGNOSIS — Z8679 Personal history of other diseases of the circulatory system: Secondary | ICD-10-CM

## 2021-08-29 DIAGNOSIS — I6389 Other cerebral infarction: Secondary | ICD-10-CM

## 2021-08-29 DIAGNOSIS — I639 Cerebral infarction, unspecified: Secondary | ICD-10-CM | POA: Diagnosis present

## 2021-08-29 LAB — RAPID URINE DRUG SCREEN, HOSP PERFORMED
Amphetamines: NOT DETECTED
Barbiturates: NOT DETECTED
Benzodiazepines: NOT DETECTED
Cocaine: NOT DETECTED
Opiates: NOT DETECTED
Tetrahydrocannabinol: NOT DETECTED

## 2021-08-29 LAB — LIPID PANEL
Cholesterol: 122 mg/dL (ref 0–200)
HDL: 46 mg/dL (ref >40)
LDL Cholesterol: 65 mg/dL (ref 0–99)
Total CHOL/HDL Ratio: 2.7 RATIO
Triglycerides: 56 mg/dL (ref <150)
VLDL: 11 mg/dL (ref 0–40)

## 2021-08-29 LAB — ECHOCARDIOGRAM COMPLETE
AR max vel: 1.63 cm2
AV Area VTI: 1.57 cm2
AV Area mean vel: 1.57 cm2
AV Mean grad: 3 mmHg
AV Peak grad: 4.8 mmHg
Ao pk vel: 1.09 m/s
Area-P 1/2: 3.6 cm2
Height: 64 in
S' Lateral: 3 cm
Weight: 3174.62 oz

## 2021-08-29 LAB — URINALYSIS, ROUTINE W REFLEX MICROSCOPIC
Bilirubin Urine: NEGATIVE
Glucose, UA: NEGATIVE mg/dL
Hgb urine dipstick: NEGATIVE
Ketones, ur: NEGATIVE mg/dL
Leukocytes,Ua: NEGATIVE
Nitrite: NEGATIVE
Protein, ur: NEGATIVE mg/dL
Specific Gravity, Urine: 1.01 (ref 1.005–1.030)
pH: 5.5 (ref 5.0–8.0)

## 2021-08-29 LAB — HEMOGLOBIN A1C
Hgb A1c MFr Bld: 6.6 % — ABNORMAL HIGH (ref 4.8–5.6)
Mean Plasma Glucose: 142.72 mg/dL

## 2021-08-29 LAB — CBG MONITORING, ED
Glucose-Capillary: 113 mg/dL — ABNORMAL HIGH (ref 70–99)
Glucose-Capillary: 143 mg/dL — ABNORMAL HIGH (ref 70–99)
Glucose-Capillary: 152 mg/dL — ABNORMAL HIGH (ref 70–99)
Glucose-Capillary: 174 mg/dL — ABNORMAL HIGH (ref 70–99)
Glucose-Capillary: 84 mg/dL (ref 70–99)

## 2021-08-29 MED ORDER — INSULIN ASPART 100 UNIT/ML IJ SOLN
0.0000 [IU] | Freq: Three times a day (TID) | INTRAMUSCULAR | Status: DC
  Administered 2021-08-29: 3 [IU] via SUBCUTANEOUS

## 2021-08-29 MED ORDER — PANTOPRAZOLE SODIUM 40 MG PO TBEC
80.0000 mg | DELAYED_RELEASE_TABLET | Freq: Every day | ORAL | Status: DC
  Administered 2021-08-30: 80 mg via ORAL
  Filled 2021-08-29: qty 2

## 2021-08-29 MED ORDER — SENNOSIDES-DOCUSATE SODIUM 8.6-50 MG PO TABS: 1.0000 | ORAL_TABLET | Freq: Every evening | ORAL | Status: DC | PRN

## 2021-08-29 MED ORDER — PANTOPRAZOLE SODIUM 40 MG PO TBEC: 80.0000 mg | DELAYED_RELEASE_TABLET | Freq: Every day | ORAL | Status: DC

## 2021-08-29 MED ORDER — ACETAMINOPHEN 650 MG RE SUPP: 650.0000 mg | RECTAL | Status: DC | PRN

## 2021-08-29 MED ORDER — STROKE: EARLY STAGES OF RECOVERY BOOK
Freq: Once | Status: AC
  Filled 2021-08-29: qty 1

## 2021-08-29 MED ORDER — SODIUM CHLORIDE 0.9 % IV SOLN: INTRAVENOUS | Status: DC

## 2021-08-29 MED ORDER — ACETAMINOPHEN 160 MG/5ML PO SOLN: 650.0000 mg | ORAL | Status: DC | PRN

## 2021-08-29 MED ORDER — ACETAMINOPHEN 325 MG PO TABS: 650.0000 mg | ORAL_TABLET | ORAL | Status: DC | PRN

## 2021-08-29 MED ORDER — ONDANSETRON HCL 4 MG/2ML IJ SOLN
4.0000 mg | Freq: Four times a day (QID) | INTRAMUSCULAR | Status: DC | PRN
  Administered 2021-08-29: 4 mg via INTRAVENOUS
  Filled 2021-08-29: qty 2

## 2021-08-29 NOTE — ED Notes (Signed)
Ambulated to the bathroom across the room with 1 person assist. Pt reports bilateral leg weakness with ambulation.

## 2021-08-29 NOTE — ED Notes (Signed)
OT at [facility]. PT to see. Pt tending to lean/ fall backwards x3 while walking. Encouraged/ instructed to call for help with ambulation.

## 2021-08-29 NOTE — Progress Notes (Signed)
PROGRESS NOTE    Claire Mcgrath  ZOX:[id number] DOB: [birth year] DOA: 08/28/2021 PCP: Ralene Ok, MD    Brief Narrative:  75 y.o. female with medical history significant of  cerebrovascular accident, heart block, s/p pacemaker, PAF on Eliquis, chronic heart failure presenting to emergency department with leg weakness and  incoordination.  Patient reports he took a nap around 5 PM, woke up around 5:30 PM, and is having difficulty standing up.  She feels like both of her legs are weak and discoordinated.  She describes some lightheadedness as well.  She reports the symptoms are very similar to when she had her prior stroke.  She called EMS who activated code stroke based on this history.  At time she arrived in the ER she denies lightheadedness, she continues to feel weak in her legs.  She denies numbness.  She denies difficulty speaking.  She reports she has had intermittent headache for a week and felt unusually fatigued.  She also reports she has some nausea earlier today which resolved.  Assessment & Plan:   Active Problems:   Acute ischemic stroke (HCC)   DM2 (diabetes mellitus, type 2) (HCC)   HTN (hypertension)   Asthma   CHF (congestive heart failure) (HCC)   History of complete heart block   CVA (cerebral vascular accident) (HCC)    Acute CVA Presented with post circulation TIA despite eliquis for afib Discussed with RT, Patient's pacemaker is not comparable with MRI Neurology following. Recommendations for Eliquis Recommendations for HHPT noted by PT Neurology recs for f/u 2d echo and repeat head CT tomorrow   2. DM2 Cont on SSI coverage as needed Cont scheduled insulin as tolerated   3. Chronic CHF  Appears to be compensated at this time Continue beta blocker as tolerated   4. HTN  BP stable at this time 2. Cont on metoprolol as tolerated   5. Asthma - stable. No wheezing.   6. Afib  1. Rate controlled at this time  2. Pt is continued on eliquis  3. Cont  metoprolol as scheduled    DVT prophylaxis: Eliquis Code Status: Full Family Communication: Pt in room, family not at bedside  Status is: Observation  The patient remains OBS appropriate and will d/c before 2 midnights.  Dispo: The patient is from: Home              Anticipated d/c is to: Home              Patient currently is not medically stable to d/c.   Difficult to place patient No       Consultants:  Neurology  Procedures:    Antimicrobials: Anti-infectives (From admission, onward)    None       Subjective: Without complaints this AM  Objective: Vitals:   08/29/21 1000 08/29/21 1100 08/29/21 1200 08/29/21 1423  BP: 99/86 (!) 141/79  (!) 115/52  Pulse: 62 65  (!) 59  Resp: (!) Temp:      TempSrc:      SpO2: 97% 97%  97%  Weight:   90 kg   Height:    (1.626 m)    No intake or output data in the 24 hours ending 08/29/21 1755 Filed Weights   08/29/21 1200  Weight: 90 kg    Examination: General exam: Awake, laying in bed, in nad Respiratory system: Normal respiratory effort, no wheezing Cardiovascular system: regular rate, s1, s2 Gastrointestinal system: Soft, nondistended, positive BS  Central nervous system: CN2-12 grossly intact, strength intact Extremities: Perfused, no clubbing Skin: Normal skin turgor, no notable skin lesions seen Psychiatry: Mood normal // no visual hallucinations   Data Reviewed: I have personally reviewed following labs and imaging studies  CBC: Recent Labs  Lab 08/28/21 1851 08/28/21 1856  WBC 10.2  --   NEUTROABS 4.6  --   HGB 12.4 13.3  HCT 39.1 39.0  MCV 87.7  --   PLT 274  --    Basic Metabolic Panel: Recent Labs  Lab 08/28/21 1851 08/28/21 1856  NA 139 142  K 3.9 3.9  CL 107 106  CO2 23  --   GLUCOSE 86 87  BUN 21 22  CREATININE 1.21* 1.20*  CALCIUM 9.5  --    GFR: Estimated Creatinine Clearance: 44 mL/min (A) (by C-G formula based on SCr of 1.2 mg/dL (H)). Liver Function  Tests: Recent Labs  Lab 08/28/21 1851  AST 18  ALT 15  ALKPHOS 46  BILITOT 0.6  PROT 8.0  ALBUMIN 3.8   No results for input(s): LIPASE, AMYLASE in the last 168 hours. No results for input(s): AMMONIA in the last 168 hours. Coagulation Profile: Recent Labs  Lab 08/28/21 1851  INR 1.1   Cardiac Enzymes: No results for input(s): CKTOTAL, CKMB, CKMBINDEX, TROPONINI in the last 168 hours. BNP (last 3 results) No results for input(s): PROBNP in the last 8760 hours. HbA1C: Recent Labs    08/28/21 1851  HGBA1C 6.6*   CBG: Recent Labs  Lab 08/29/21 0407 08/29/21 0753 08/29/21 1133 08/29/21 1200 08/29/21 1645  GLUCAP 113* 143* 174* 152* 84   Lipid Profile: Recent Labs    08/29/21 0817  CHOL 122  HDL 46  LDLCALC 65  TRIG 56  CHOLHDL 2.7   Thyroid Function Tests: No results for input(s): TSH, T4TOTAL, FREET4, T3FREE, THYROIDAB in the last 72 hours. Anemia Panel: No results for input(s): VITAMINB12, FOLATE, FERRITIN, TIBC, IRON, RETICCTPCT in the last 72 hours. Sepsis Labs: No results for input(s): PROCALCITON, LATICACIDVEN in the last 168 hours.  Recent Results (from the past 240 hour(s))  Resp Panel by RT-PCR (Flu A&B, Covid) Nasopharyngeal Swab     Status: None   Collection Time: 08/28/21  6:51 PM   Specimen: Nasopharyngeal Swab; Nasopharyngeal(NP) swabs in vial transport medium  Result Value Ref Range Status   SARS Coronavirus 2 by RT PCR NEGATIVE NEGATIVE Final    Comment: (NOTE) SARS-CoV-2 target nucleic acids are NOT DETECTED.  The SARS-CoV-2 RNA is generally detectable in upper respiratory specimens during the acute phase of infection. The lowest concentration of SARS-CoV-2 viral copies this assay can detect is 138 copies/mL. A negative result does not preclude SARS-Cov-2 infection and should not be used as the sole basis for treatment or other patient management decisions. A negative result may occur with  improper specimen collection/handling,  submission of specimen other than nasopharyngeal swab, presence of viral mutation(s) within the areas targeted by this assay, and inadequate number of viral copies(<138 copies/mL). A negative result must be combined with clinical observations, patient history, and epidemiological information. The expected result is Negative.  Fact Sheet for Patients:  BloggerCourse.com  Fact Sheet for Healthcare Providers:  SeriousBroker.it  This test is no t yet approved or cleared by the [place] FDA and  has been authorized for detection and/or diagnosis of SARS-CoV-2 by FDA under an Emergency Use Authorization (EUA). This EUA will remain  in effect (meaning this test can be used) for the  duration of the COVID-19 declaration under Section 564(b)(1) of the Act, 21 U.S.C.section 360bbb-3(b)(1), unless the authorization is terminated  or revoked sooner.       Influenza A by PCR NEGATIVE NEGATIVE Final   Influenza B by PCR NEGATIVE NEGATIVE Final    Comment: (NOTE) The Xpert Xpress SARS-CoV-2/FLU/RSV plus assay is intended as an aid in the diagnosis of influenza from Nasopharyngeal swab specimens and should not be used as a sole basis for treatment. Nasal washings and aspirates are unacceptable for Xpert Xpress SARS-CoV-2/FLU/RSV testing.  Fact Sheet for Patients: BloggerCourse.comhttps://www.fda.gov/media/152166/download  Fact Sheet for Healthcare Providers: SeriousBroker.ithttps://www.fda.gov/media/152162/download  This test is not yet approved or cleared by the [place] FDA and has been authorized for detection and/or diagnosis of SARS-CoV-2 by FDA under an Emergency Use Authorization (EUA). This EUA will remain in effect (meaning this test can be used) for the duration of the COVID-19 declaration under Section 564(b)(1) of the Act, 21 U.S.C. section 360bbb-3(b)(1), unless the authorization is terminated or revoked.  Performed at [facility], [address] [place], [place] [address]      Radiology Studies: ECHOCARDIOGRAM COMPLETE  Result Date: 08/29/2021    ECHOCARDIOGRAM REPORT   Patient Name:   Larey SeatSYLVIA C August Date of Exam: 08/29/2021 Medical Rec #:  [id number]      Height:       64.0 in Accession #:    [id number]     Weight:       198.4 lb Date of Birth:  Feb 23, 1946     BSA:          1.950 m Patient Age:    74 years       BP:           115/52 mmHg Patient Gender: F              HR:           59 bpm. Exam Location:  Inpatient Procedure: 2D Echo, Cardiac Doppler and Color Doppler Indications:    Stroke  History:        Patient has prior history of Echocardiogram examinations, most                 recent 09/06/2016. Pacemaker; Arrythmias:Atrial Fibrillation. H/O                 CVA.  Sonographer:    Ross LudwigArthur Guy RDCS (AE) Referring Phys: [id number] MICHAEL E NORINS IMPRESSIONS  1. Left ventricular ejection fraction, by estimation, is 60 to 65%. The left ventricle has normal function. The left ventricle has no regional wall motion abnormalities. There is moderate left ventricular hypertrophy. Left ventricular diastolic function  could not be evaluated.  2. Right ventricular systolic function is normal. The right ventricular size is normal. There is normal pulmonary artery systolic pressure. The estimated right ventricular systolic pressure is 23.4 mmHg.  3. The mitral valve is abnormal. Trivial mitral valve regurgitation.  4. The aortic valve is tricuspid. Aortic valve regurgitation is not visualized. Mild aortic valve sclerosis is present, with no evidence of aortic valve stenosis. Aortic valve mean gradient measures 3.0 mmHg.  5. The inferior vena cava is dilated in size with >50% respiratory variability, suggesting right atrial pressure of 8 mmHg. FINDINGS  Left Ventricle: Left ventricular ejection fraction, by estimation, is 60 to 65%. The left ventricle has normal function. The left ventricle has no regional wall motion abnormalities. The left ventricular  internal cavity size was normal in size. There  is  moderate left ventricular hypertrophy. Left ventricular diastolic function could not be evaluated due to atrial fibrillation. Left ventricular diastolic function could not be evaluated. Right Ventricle: The right ventricular size is normal. No increase in right ventricular wall thickness. Right ventricular systolic function is normal. There is normal pulmonary artery systolic pressure. The tricuspid regurgitant velocity is 1.96 m/s, and  with an assumed right atrial pressure of 8 mmHg, the estimated right ventricular systolic pressure is 23.4 mmHg. Left Atrium: Left atrial size was normal in size. Right Atrium: Right atrial size was normal in size. Pericardium: There is no evidence of pericardial effusion. Mitral Valve: The mitral valve is abnormal. There is mild thickening of the mitral valve leaflet(s). Trivial mitral valve regurgitation. Tricuspid Valve: The tricuspid valve is grossly normal. Tricuspid valve regurgitation is trivial. Aortic Valve: The aortic valve is tricuspid. Aortic valve regurgitation is not visualized. Mild aortic valve sclerosis is present, with no evidence of aortic valve stenosis. Aortic valve mean gradient measures 3.0 mmHg. Aortic valve peak gradient measures 4.8 mmHg. Aortic valve area, by VTI measures 1.57 cm. Pulmonic Valve: The pulmonic valve was normal in structure. Pulmonic valve regurgitation is not visualized. Aorta: The aortic root and ascending aorta are structurally normal, with no evidence of dilitation. Venous: The inferior vena cava is dilated in size with greater than 50% respiratory variability, suggesting right atrial pressure of 8 mmHg. IAS/Shunts: No atrial level shunt detected by color flow Doppler.  LEFT VENTRICLE PLAX 2D LVIDd:         4.20 cm  Diastology LVIDs:         3.00 cm  LV e' medial:    10.60 cm/s LV PW:         1.40 cm  LV E/e' medial:  9.4 LV IVS:        1.50 cm  LV e' lateral:   12.70 cm/s LVOT diam:      1.90 cm  LV E/e' lateral: 7.8 LV SV:         41 LV SV Index:   21 LVOT Area:     2.84 cm  RIGHT VENTRICLE            IVC RV Basal diam:  2.50 cm    IVC diam: 2.20 cm RV S prime:     8.85 cm/s TAPSE (M-mode): 2.1 cm LEFT ATRIUM             Index       RIGHT ATRIUM          Index LA diam:        2.80 cm 1.44 cm/m  RA Area:     9.35 cm LA Vol (A2C):   48.4 ml 24.82 ml/m RA Volume:   18.20 ml 9.33 ml/m LA Vol (A4C):   37.2 ml 19.08 ml/m LA Biplane Vol: 43.9 ml 22.52 ml/m  AORTIC VALVE AV Area (Vmax):    1.63 cm AV Area (Vmean):   1.57 cm AV Area (VTI):     1.57 cm AV Vmax:           109.00 cm/s AV Vmean:          79.100 cm/s AV VTI:            0.258 m AV Peak Grad:      4.8 mmHg AV Mean Grad:      3.0 mmHg LVOT Vmax:         62.50 cm/s LVOT Vmean:  43.900 cm/s LVOT VTI:          0.143 m LVOT/AV VTI ratio: 0.55  AORTA Ao Root diam: 3.10 cm Ao Asc diam:  2.90 cm MITRAL VALVE               TRICUSPID VALVE MV Area (PHT): 3.60 cm    TR Peak grad:   15.4 mmHg MV Decel Time: 211 msec    TR Vmax:        196.00 cm/s MV E velocity: 99.60 cm/s MV A velocity: 34.60 cm/s  SHUNTS MV E/A ratio:  2.88        Systemic VTI:  0.14 m                            Systemic Diam: 1.90 cm Zoila Shutter MD Electronically signed by Zoila Shutter MD Signature Date/Time: 08/29/2021/5:51:43 PM    Final    CT HEAD CODE STROKE WO CONTRAST  Result Date: 08/28/2021 CLINICAL DATA:  Code stroke.  Neuro deficit, acute, stroke suspected EXAM: CT HEAD WITHOUT CONTRAST TECHNIQUE: Contiguous axial images were obtained from the base of the skull through the vertex without intravenous contrast. COMPARISON:  Multiple priors, most recent October 06, 2016. FINDINGS: Brain: No evidence of acute large vascular territory infarction, hemorrhage, hydrocephalus, extra-axial collection or mass lesion/mass effect. Remote infarct in the posterior limb of the left internal capsule. Additional mild patchy white matter hypoattenuation, nonspecific but  compatible with chronic microvascular ischemic disease. Vascular: No hyperdense vessel identified. Skull: No acute fracture. Sinuses/Orbits: Clear visualized sinuses. No acute orbital findings. Other: No mastoid effusions. ASPECTS [facility] Stroke Program Early CT Score) total score (0-10 with 10 being normal): 10. IMPRESSION: 1. No evidence of acute large vascular territory infarct or acute hemorrhage. ASPECTS is 10. 2. Remote infarct in the posterior limb of the left internal capsule. 3. Mild chronic microvascular ischemic disease. 4. Code stroke imaging results were communicated on 08/28/2021 at 7:06 pm to provider Dr. Otelia Limes via secure text paging. Electronically Signed   By: Feliberto Harts M.D.   On: 08/28/2021 19:08   CT ANGIO HEAD CODE STROKE  Result Date: 08/28/2021 CLINICAL DATA:  Neuro deficit, acute, stroke suspected EXAM: CT ANGIOGRAPHY HEAD AND NECK TECHNIQUE: Multidetector CT imaging of the head and neck was performed using the standard protocol during bolus administration of intravenous contrast. Multiplanar CT image reconstructions and MIPs were obtained to evaluate the vascular anatomy. Carotid stenosis measurements (when applicable) are obtained utilizing NASCET criteria, using the distal internal carotid diameter as the denominator. CONTRAST:  64mL OMNIPAQUE IOHEXOL 350 MG/ML SOLN COMPARISON:  None. 09/05/2016. FINDINGS: CTA NECK FINDINGS Aortic arch: Atherosclerosis of the aorta. Great vessel origins are patent. Right carotid system: Ulcerated atherosclerosis at the carotid bifurcation without greater than 50% stenosis, similar to prior. Left carotid system: Ulcerated atherosclerosis at the carotid bifurcation without greater than 50% stenosis, similar to prior. Vertebral arteries: Mild proximal vertebral arteries narrowing bilaterally, similar to prior. No significant (greater than 50%) stenosis. Codominant. Skeleton: No acute osseous abnormality identified. Degenerative change in the  visualized thoracic spine. Other neck: Enlarged, heterogeneous thyroid with multiple nodules, measuring up to 2 cm anteriorly. Upper chest: Paraseptal emphysema. No consolidation in the visualized lung apices. Review of the MIP images confirms the above findings CTA HEAD FINDINGS Anterior circulation: Bilateral intracranial ICAs are patent with similar up to moderate stenosis bilaterally due to calcific atherosclerosis. Bilateral MCAs and ACAs are patent without proximal hemodynamically  significant stenosis. No aneurysm identified. Posterior circulation: Save draft bilateral intradural vertebral arteries, basilar artery, and posterior cerebral arteries are patent. Similar moderate left P2 PCA stenosis. Bilateral PICAs are patent. No aneurysm identified. Venous sinuses: As permitted by contrast timing, patent. Anatomic variants: None. Review of the MIP images confirms the above findings IMPRESSION: 1. No large vessel occlusion. 2. Similar moderate left P2 PCA stenosis. 3. Similar moderate bilateral intracranial ICA stenosis. 4. Similar bilateral carotid bifurcation atherosclerosis without greater than 50% stenosis. 5. Similar mild proximal vertebral artery stenosis bilaterally. 6. Enlarged and heterogeneous thyroid with multiple nodules, measuring up to 2 cm anteriorly. Recommend thyroid US (Ref: J Am Coll Radiol. 2015 Feb;12(2): 143-50). 7. Aortic Atherosclerosis (ICD10-I70.0) and Emphysema (ICD10-J43.9). Electronically Signed   By: Feliberto Harts M.D.   On: 08/28/2021 19:42   CT ANGIO NECK CODE STROKE  Result Date: 08/28/2021 CLINICAL DATA:  Neuro deficit, acute, stroke suspected EXAM: CT ANGIOGRAPHY HEAD AND NECK TECHNIQUE: Multidetector CT imaging of the head and neck was performed using the standard protocol during bolus administration of intravenous contrast. Multiplanar CT image reconstructions and MIPs were obtained to evaluate the vascular anatomy. Carotid stenosis measurements (when applicable) are  obtained utilizing NASCET criteria, using the distal internal carotid diameter as the denominator. CONTRAST:  83mL OMNIPAQUE IOHEXOL 350 MG/ML SOLN COMPARISON:  None. 09/05/2016. FINDINGS: CTA NECK FINDINGS Aortic arch: Atherosclerosis of the aorta. Great vessel origins are patent. Right carotid system: Ulcerated atherosclerosis at the carotid bifurcation without greater than 50% stenosis, similar to prior. Left carotid system: Ulcerated atherosclerosis at the carotid bifurcation without greater than 50% stenosis, similar to prior. Vertebral arteries: Mild proximal vertebral arteries narrowing bilaterally, similar to prior. No significant (greater than 50%) stenosis. Codominant. Skeleton: No acute osseous abnormality identified. Degenerative change in the visualized thoracic spine. Other neck: Enlarged, heterogeneous thyroid with multiple nodules, measuring up to 2 cm anteriorly. Upper chest: Paraseptal emphysema. No consolidation in the visualized lung apices. Review of the MIP images confirms the above findings CTA HEAD FINDINGS Anterior circulation: Bilateral intracranial ICAs are patent with similar up to moderate stenosis bilaterally due to calcific atherosclerosis. Bilateral MCAs and ACAs are patent without proximal hemodynamically significant stenosis. No aneurysm identified. Posterior circulation: Save draft bilateral intradural vertebral arteries, basilar artery, and posterior cerebral arteries are patent. Similar moderate left P2 PCA stenosis. Bilateral PICAs are patent. No aneurysm identified. Venous sinuses: As permitted by contrast timing, patent. Anatomic variants: None. Review of the MIP images confirms the above findings IMPRESSION: 1. No large vessel occlusion. 2. Similar moderate left P2 PCA stenosis. 3. Similar moderate bilateral intracranial ICA stenosis. 4. Similar bilateral carotid bifurcation atherosclerosis without greater than 50% stenosis. 5. Similar mild proximal vertebral artery stenosis  bilaterally. 6. Enlarged and heterogeneous thyroid with multiple nodules, measuring up to 2 cm anteriorly. Recommend thyroid US (Ref: J Am Coll Radiol. 2015 Feb;12(2): 143-50). 7. Aortic Atherosclerosis (ICD10-I70.0) and Emphysema (ICD10-J43.9). Electronically Signed   By: Feliberto Harts M.D.   On: 08/28/2021 19:42    Scheduled Meds:  apixaban  5 mg Oral BID   lisinopril  10 mg Oral Daily   And   hydrochlorothiazide  12.5 mg Oral Daily   insulin aspart  0-15 Units Subcutaneous TID WC   insulin aspart protamine- aspart  20 Units Subcutaneous Q breakfast   And   insulin aspart protamine- aspart  12 Units Subcutaneous Q supper   [START ON 08/31/2021] metFORMIN  1,000 mg Oral QHS   metoprolol succinate  25 mg  Oral Daily   pantoprazole  80 mg Oral Daily   pravastatin  10 mg Oral QHS   Continuous Infusions:  sodium chloride 50 mL/hr at 08/29/21 1125     LOS: 0 days   Rickey Barbara, MD [facility] Pager On Amion  If 7PM-7AM, please contact night-coverage 08/29/2021, 5:55 PM

## 2021-08-29 NOTE — ED Notes (Signed)
Pt was able to ambulate to the restroom with assistance  

## 2021-08-29 NOTE — ED Notes (Signed)
Pt alert, NAD, calm, interactive, resps e/u. Denies pain, sob, nausea, dizziness, numbness or tingling, or HA. Admits to legs felt weak while walking to b/r this am. Reports ate breakfast. Insulin requested from pharmacy. Speaking on phone.

## 2021-08-29 NOTE — ED Notes (Signed)
Pt is actively throwing up.

## 2021-08-29 NOTE — Progress Notes (Addendum)
STROKE TEAM PROGRESS NOTE   INTERVAL HISTORY 75 year old black female with prior left PCA CVA in 2017, pacemaker, and atrial fibrillation on eliquis, presenting with acute onset  unsteady gait,BLE weakness and incoordination.  CT head shows no acute intracranial process. Pt cannot have MRI due to pacemaker. She states she was compliant with taking her eliquis daily.  CT angiogram of the brain and neck does not show large vessel stenosis or occlusion.    Vitals:   08/29/21 0900 08/29/21 1000 08/29/21 1100 08/29/21 1200  BP: 100/65 99/86 (!) 141/79   Pulse: 63 62 65   Resp: 19 (!) 23 17   Temp:      TempSrc:      SpO2: 96% 97% 97%   Weight:    90 kg  Height:    5\' 4"  (1.626 m)   CBC:  Recent Labs  Lab 08/28/21 1851 08/28/21 1856  WBC 10.2  --   NEUTROABS 4.6  --   HGB 12.4 13.3  HCT 39.1 39.0  MCV 87.7  --   PLT 274  --    Basic Metabolic Panel:  Recent Labs  Lab 08/28/21 1851 08/28/21 1856  NA 139 142  K 3.9 3.9  CL 107 106  CO2 23  --   GLUCOSE 86 87  BUN 21 22  CREATININE 1.21* 1.20*  CALCIUM 9.5  --     Lipid Panel:  Recent Labs  Lab 08/29/21 0817  CHOL 122  TRIG 56  HDL 46  CHOLHDL 2.7  VLDL 11  LDLCALC 65    HgbA1c:  Recent Labs  Lab 08/28/21 1851  HGBA1C 6.6*   Urine Drug Screen:  Recent Labs  Lab 08/29/21 0233  LABOPIA NONE DETECTED  COCAINSCRNUR NONE DETECTED  LABBENZ NONE DETECTED  AMPHETMU NONE DETECTED  THCU NONE DETECTED  LABBARB NONE DETECTED    Alcohol Level  Recent Labs  Lab 08/28/21 1851  ETH <10    IMAGING past 24 hours CT HEAD CODE STROKE WO CONTRAST  Result Date: 08/28/2021 CLINICAL DATA:  Code stroke.  Neuro deficit, acute, stroke suspected EXAM: CT HEAD WITHOUT CONTRAST TECHNIQUE: Contiguous axial images were obtained from the base of the skull through the vertex without intravenous contrast. COMPARISON:  Multiple priors, most recent October 06, 2016. FINDINGS: Brain: No evidence of acute large vascular territory  infarction, hemorrhage, hydrocephalus, extra-axial collection or mass lesion/mass effect. Remote infarct in the posterior limb of the left internal capsule. Additional mild patchy white matter hypoattenuation, nonspecific but compatible with chronic microvascular ischemic disease. Vascular: No hyperdense vessel identified. Skull: No acute fracture. Sinuses/Orbits: Clear visualized sinuses. No acute orbital findings. Other: No mastoid effusions. ASPECTS [facility] Stroke Program Early CT Score) total score (0-10 with 10 being normal): 10. IMPRESSION: 1. No evidence of acute large vascular territory infarct or acute hemorrhage. ASPECTS is 10. 2. Remote infarct in the posterior limb of the left internal capsule. 3. Mild chronic microvascular ischemic disease. 4. Code stroke imaging results were communicated on 08/28/2021 at 7:06 pm to provider Dr. 10/28/2021 via secure text paging. Electronically Signed   By: Otelia Limes M.D.   On: 08/28/2021 19:08   CT ANGIO HEAD CODE STROKE  Result Date: 08/28/2021 CLINICAL DATA:  Neuro deficit, acute, stroke suspected EXAM: CT ANGIOGRAPHY HEAD AND NECK TECHNIQUE: Multidetector CT imaging of the head and neck was performed using the standard protocol during bolus administration of intravenous contrast. Multiplanar CT image reconstructions and MIPs were obtained to evaluate the vascular  anatomy. Carotid stenosis measurements (when applicable) are obtained utilizing NASCET criteria, using the distal internal carotid diameter as the denominator. CONTRAST:  66mL OMNIPAQUE IOHEXOL 350 MG/ML SOLN COMPARISON:  None. 09/05/2016. FINDINGS: CTA NECK FINDINGS Aortic arch: Atherosclerosis of the aorta. Great vessel origins are patent. Right carotid system: Ulcerated atherosclerosis at the carotid bifurcation without greater than 50% stenosis, similar to prior. Left carotid system: Ulcerated atherosclerosis at the carotid bifurcation without greater than 50% stenosis, similar to prior.  Vertebral arteries: Mild proximal vertebral arteries narrowing bilaterally, similar to prior. No significant (greater than 50%) stenosis. Codominant. Skeleton: No acute osseous abnormality identified. Degenerative change in the visualized thoracic spine. Other neck: Enlarged, heterogeneous thyroid with multiple nodules, measuring up to 2 cm anteriorly. Upper chest: Paraseptal emphysema. No consolidation in the visualized lung apices. Review of the MIP images confirms the above findings CTA HEAD FINDINGS Anterior circulation: Bilateral intracranial ICAs are patent with similar up to moderate stenosis bilaterally due to calcific atherosclerosis. Bilateral MCAs and ACAs are patent without proximal hemodynamically significant stenosis. No aneurysm identified. Posterior circulation: Save draft bilateral intradural vertebral arteries, basilar artery, and posterior cerebral arteries are patent. Similar moderate left P2 PCA stenosis. Bilateral PICAs are patent. No aneurysm identified. Venous sinuses: As permitted by contrast timing, patent. Anatomic variants: None. Review of the MIP images confirms the above findings IMPRESSION: 1. No large vessel occlusion. 2. Similar moderate left P2 PCA stenosis. 3. Similar moderate bilateral intracranial ICA stenosis. 4. Similar bilateral carotid bifurcation atherosclerosis without greater than 50% stenosis. 5. Similar mild proximal vertebral artery stenosis bilaterally. 6. Enlarged and heterogeneous thyroid with multiple nodules, measuring up to 2 cm anteriorly. Recommend thyroid US (Ref: J Am Coll Radiol. 2015 Feb;12(2): 143-50). 7. Aortic Atherosclerosis (ICD10-I70.0) and Emphysema (ICD10-J43.9). Electronically Signed   By: Feliberto Harts M.D.   On: 08/28/2021 19:42   CT ANGIO NECK CODE STROKE  Result Date: 08/28/2021 CLINICAL DATA:  Neuro deficit, acute, stroke suspected EXAM: CT ANGIOGRAPHY HEAD AND NECK TECHNIQUE: Multidetector CT imaging of the head and neck was performed  using the standard protocol during bolus administration of intravenous contrast. Multiplanar CT image reconstructions and MIPs were obtained to evaluate the vascular anatomy. Carotid stenosis measurements (when applicable) are obtained utilizing NASCET criteria, using the distal internal carotid diameter as the denominator. CONTRAST:  57mL OMNIPAQUE IOHEXOL 350 MG/ML SOLN COMPARISON:  None. 09/05/2016. FINDINGS: CTA NECK FINDINGS Aortic arch: Atherosclerosis of the aorta. Great vessel origins are patent. Right carotid system: Ulcerated atherosclerosis at the carotid bifurcation without greater than 50% stenosis, similar to prior. Left carotid system: Ulcerated atherosclerosis at the carotid bifurcation without greater than 50% stenosis, similar to prior. Vertebral arteries: Mild proximal vertebral arteries narrowing bilaterally, similar to prior. No significant (greater than 50%) stenosis. Codominant. Skeleton: No acute osseous abnormality identified. Degenerative change in the visualized thoracic spine. Other neck: Enlarged, heterogeneous thyroid with multiple nodules, measuring up to 2 cm anteriorly. Upper chest: Paraseptal emphysema. No consolidation in the visualized lung apices. Review of the MIP images confirms the above findings CTA HEAD FINDINGS Anterior circulation: Bilateral intracranial ICAs are patent with similar up to moderate stenosis bilaterally due to calcific atherosclerosis. Bilateral MCAs and ACAs are patent without proximal hemodynamically significant stenosis. No aneurysm identified. Posterior circulation: Save draft bilateral intradural vertebral arteries, basilar artery, and posterior cerebral arteries are patent. Similar moderate left P2 PCA stenosis. Bilateral PICAs are patent. No aneurysm identified. Venous sinuses: As permitted by contrast timing, patent. Anatomic variants: None. Review of the MIP  images confirms the above findings IMPRESSION: 1. No large vessel occlusion. 2. Similar  moderate left P2 PCA stenosis. 3. Similar moderate bilateral intracranial ICA stenosis. 4. Similar bilateral carotid bifurcation atherosclerosis without greater than 50% stenosis. 5. Similar mild proximal vertebral artery stenosis bilaterally. 6. Enlarged and heterogeneous thyroid with multiple nodules, measuring up to 2 cm anteriorly. Recommend thyroid US (Ref: J Am Coll Radiol. 2015 Feb;12(2): 143-50). 7. Aortic Atherosclerosis (ICD10-I70.0) and Emphysema (ICD10-J43.9). Electronically Signed   By: Feliberto Harts M.D.   On: 08/28/2021 19:42    PHYSICAL EXAM Pleasant elderly lady not in distress. . Afebrile. Head is nontraumatic. Neck is supple without bruit.    Cardiac exam no murmur or gallop. Lungs are clear to auscultation. Distal pulses are well felt.   Neurological Examination Mental Status: Awake and alert. Oriented x 4. Speech fluent with intact naming for all items given and comprehension for all basic commands. Repetition intact. No dysarthria.   Cranial Nerves: II: Temporal visual fields intact with no extinction to DSS. PERRL  III,IV, VI: no ptosis, extra-ocular motions intact bilaterally  V: Temp sensation equal bilaterally.  VII: Smile symmetric. Cheek puffing normal.  VIII: hearing normal bilaterally IX,X: No hypophonia XI: Symmetric XII: midline tongue extension  Motor: RUE able to hold antigravity for > 10 seconds with no drift and with 5/5 strength proximally and distally.  LUE 5/5 proximally and distally. No drift RLE 5/5 strength proximally and distally for short durations. LLE 5/5 proximally and distally with most trials. Some giveway occurs that resolves with repeated coaching.  Bulk is normal and symmetric x 4.   Sensory: Temp and light touch intact throughout, bilaterally. No extinction to DSS.  Deep Tendon Reflexes: 2+ and symmetric bilateral upper extremities. Trace patellar reflexes bilaterally. Absent achilles reflexes bilaterally.  Plantars:  Right: downgoing                           Left: downgoing Cerebellar:  RUE: No ataxia with FNF and normal RAM.  LUE: Initially with subtle ataxic movement on FNF that then resolves. Dysdiadochokinesia with RAM.  Gait: Deferred due to falls risk concerns  ASSESSMENT/PLAN [name] is a 75 y.o. female with history of  CHF, DM, HLD, HTN, stroke, heart block s/p pacemaker, paroxysmal atrial fibrillation on Eliquis who presents to the ED via EMS after acute onset of BLE weakness and incoordination. LKN was 5 PM when she took a nap. On awakening at 5:30 PM she had difficulty standing up due to sensation of weak legs with incoordination. She also endorses lightheadedness but when she took her BP it was elevated with SBP in 170s. She felt that the above symptoms were very similar to those of her prior stroke, so EMS was called. On arrival to the ED she endorsed continued BLE weakness but resolution of the lightheadedness. No speech deficit, vision problems or sensory numbness. Endorses intermittent headache for the past week. Also with fatigue and some nausea earlier today.    Per EMS, initial BP was 200/100, CBG 88, O2 sats normal.   IV thrombolytic not administered due to DOAC use with last dose yesterday.    Posterior circulation TIA:   Code Stroke  CT head No acute abnormality.  Small vessel disease. Atrophy.  ASPECTS 10.     CTA head & neck: 1. No large vessel occlusion. 2. Similar moderate left P2 PCA stenosis. 3. Similar moderate bilateral intracranial ICA stenosis. 4. Similar bilateral  carotid bifurcation atherosclerosis without greater than 50% stenosis. 5. Similar mild proximal vertebral artery stenosis bilaterally.  MRI  ppm not compatible.  2D Echo  pending   LDL 65 HgbA1c 6.6 VTE prophylaxis -  scd    Diet   Diet heart healthy/carb modified Room service appropriate? Yes; Fluid consistency: Thin   Eliquis 5mg  prior to admission, now on Eliquis (apixaban) daily.   Therapy  recommendations:  pending Disposition:  pending   Hypertension Home meds:  lisinopril-hctz, metoprolol  Stable Permissive hypertension (OK if < 220/120) but gradually normalize in 5-7 days Long-term BP goal normotensive  Hyperlipidemia Home meds:  pravastatin,  resumed in hospital LDL 65, goal < 70  Continue statin at discharge  Diabetes type II Controlled Home meds:  metformin 1gm bid HgbA1c 6.6, goal < 7.0 CBGs Recent Labs    08/29/21 0753 08/29/21 1133 08/29/21 1200  GLUCAP 143* 174* 152*    SSI  Other Stroke Risk Factors  Advanced Age >/= 3765     Obesity, Body mass index is 34.06 kg/m., BMI >/= 30 associated with increased stroke risk, recommend weight loss, diet and exercise as appropriate   Hx stroke/TIA  Other Active Problems  . DM2- continue home regimen plus SS   Chronic CHF - well compensated. Will continue home meds   HTN transiently hypertensive during transport now normal  Asthma - stable. No wheezing.     Hospital day # 0 I have personally obtained history,examined this patient, reviewed notes, independently viewed imaging studies, participated in medical decision making and plan of care.ROS completed by me personally and pertinent positives fully documented  I have made any additions or clarifications directly to the above note. Agree with note above.  She presented with transient gait ataxia dizziness and unsteadiness likely due to posterior circulation TIA despite being on anticoagulation with Eliquis for atrial fibrillation.  Check echocardiogram and repeat CT scan tomorrow.  I had a long discussion with patient with regards to alternatives to Eliquis for anticoagulation in the setting of A. fib and also lack of definitive data suggesting superiority of the other agents or addition of aspirin.  Recommend continue Eliquis and the current dose patient advised to be compliant with taking 5 mg twice daily.  Aggressive risk factor modification.  Mobilize out of  bed.  Therapy consults.  Greater than 50% time during this 35-minute visit was spent on counseling and coordination of care and discussion with care team and answering questions.  Discussed with Dr. Deno Etiennehu.  Delia HeadyPramod Kristilyn Coltrane, MD Medical Director [facility] Pager: [phone] 08/29/2021 4:40 PM     To contact Stroke Continuity provider, please refer to [email or web address]. After hours, contact General Neurology

## 2021-08-29 NOTE — Evaluation (Signed)
Physical Therapy Evaluation Patient Details Name: Claire Mcgrath MRN: [id number] DOB: 05/07/1946 Today's Date: 08/29/2021  History of Present Illness  Claire Mcgrath is an 75 y.o. female  presented to ED via EMS on 08/28/21 after acute onset of BLE weakness and incoordination. CT (-) acute stroke;  remote infarct posterior limb L internal Capsule; CVA workup underway. PMH: Left PCA CVA 2017, CHF, DM, HLD, HTN, stroke, heart block s/p pacemaker, paroxysmal atrial fibrillation on Eliquis heart block with pacemaker  Clinical Impression  Pt admitted with above diagnosis. At baseline , pt is very independent and does not have mobility deficits.  She lives with her spouse and takes care of him (he does not need physical assist).  During PT evaluation, pt bil LE strength testing at 5/5 but did require RW for support with ambulation.  She had posterior LOB and reports mild lightheadedness and dizziness when up with OT earlier, but had no symptoms during PT evaluation and was able to ambulate 56' with RW without LOB.  Pt is below her baseline level and will benefit from acute PT services.  Pt currently with functional limitations due to the deficits listed below (see PT Problem List). Pt will benefit from skilled PT to increase their independence and safety with mobility to allow discharge to the venue listed below.          Recommendations for follow up therapy are one component of a multi-disciplinary discharge planning process, led by the attending physician.  Recommendations may be updated based on patient status, additional functional criteria and insurance authorization.  Follow Up Recommendations Home health PT;Supervision for mobility/OOB    Equipment Recommendations  None recommended by PT    Recommendations for Other Services       Precautions / Restrictions Precautions Precautions: Fall Restrictions Weight Bearing Restrictions: No      Mobility  Bed Mobility Overal bed mobility:  Needs Assistance Bed Mobility: Supine to Sit;Sit to Supine     Supine to sit: Supervision Sit to supine: Supervision   General bed mobility comments: supervision fro safety    Transfers Overall transfer level: Needs assistance Equipment used: Rolling walker (2 wheeled) Transfers: Sit to/from Stand Sit to Stand: Min guard         General transfer comment: Performed x 2 - from bed and from toielt.  Did not have LOB with PT.  Ambulation/Gait Ambulation/Gait assistance: Min guard Gait Distance (Feet): 55 Feet (55'x2) Assistive device: Rolling walker (2 wheeled) Gait Pattern/deviations: Step-to pattern;Decreased stride length;Trunk flexed     General Gait Details: Ambulated to restroom and back - 55'x2.  Min cues for RW proximity and min guard for safety but no LOB  Stairs            Wheelchair Mobility    Modified Rankin (Stroke Patients Only) Modified Rankin (Stroke Patients Only) Pre-Morbid Rankin Score: No symptoms Modified Rankin: Moderate disability     Balance Overall balance assessment: Needs assistance Sitting-balance support: Feet supported Sitting balance-Leahy Scale: Good     Standing balance support: Bilateral upper extremity supported;No upper extremity supported Standing balance-Leahy Scale: Fair Standing balance comment: RW to ambulate; stood for toileting ADLs without support; no LOB with PT but did have 3 LOB with OT earlier.  Pt reports she was lightheaded when she got up earlier but is not at this time.  Pertinent Vitals/Pain Pain Assessment: No/denies pain Faces Pain Scale: No hurt Pain Intervention(s): Monitored during session    Home Living Family/patient expects to be discharged to:: Private residence Living Arrangements: Spouse/significant other Available Help at Discharge: Available PRN/intermittently;Friend(s) Type of Home: House Home Access: Level entry     Home Layout: One level Home  Equipment: Walker - 2 wheels;Grab bars - tub/shower;Shower seat;Cane - single point      Prior Function Level of Independence: Independent         Comments: cares for her husband; drives; manages finances/medicaitons     Hand Dominance   Dominant Hand: Right    Extremity/Trunk Assessment   Upper Extremity Assessment Upper Extremity Assessment: Overall WFL for tasks assessed (ROM WFL; MMT 5/5; coordination WNL with finger tip to tip)    Lower Extremity Assessment Lower Extremity Assessment: LLE deficits/detail;RLE deficits/detail RLE Deficits / Details: ROM WFL; MMT 5/5 throughout RLE Sensation: WNL RLE Coordination: WNL LLE Deficits / Details: ROM WFL; MMT 5/5 throughout LLE Sensation: WNL LLE Coordination: WNL    Cervical / Trunk Assessment Cervical / Trunk Assessment: Normal  Communication   Communication: No difficulties  Cognition Arousal/Alertness: Awake/alert Behavior During Therapy: WFL for tasks assessed/performed Overall Cognitive Status: Within Functional Limits for tasks assessed                                 General Comments: .      General Comments General comments (skin integrity, edema, etc.): Denied dizziness or lightheadedness with PT.  Reports feeling much better than when she came in but not at baseline.    Exercises     Assessment/Plan    PT Assessment Patient needs continued PT services  PT Problem List Decreased strength;Decreased mobility;Decreased balance;Decreased knowledge of use of DME       PT Treatment Interventions DME instruction;Therapeutic activities;Gait training;Therapeutic exercise;Patient/family education;Stair training;Balance training;Functional mobility training;Neuromuscular re-education    PT Goals (Current goals can be found in the Care Plan section)  Acute Rehab PT Goals Patient Stated Goal: to figure out what was going on with balance/weakness PT Goal Formulation: With patient Time For Goal  Achievement: 09/12/21 Potential to Achieve Goals: Good    Frequency Min 4X/week   Barriers to discharge        Co-evaluation               AM-PAC PT "6 Clicks" Mobility  Outcome Measure Help needed turning from your back to your side while in a flat bed without using bedrails?: None Help needed moving from lying on your back to sitting on the side of a flat bed without using bedrails?: A Little Help needed moving to and from a bed to a chair (including a wheelchair)?: A Little Help needed standing up from a chair using your arms (e.g., wheelchair or bedside chair)?: A Little Help needed to walk in hospital room?: A Little Help needed climbing 3-5 steps with a railing? : A Little 6 Click Score: 19    End of Session Equipment Utilized During Treatment: Gait belt Activity Tolerance: Patient tolerated treatment well Patient left: in bed;with call bell/phone within reach;with bed alarm set Nurse Communication: Mobility status PT Visit Diagnosis: Unsteadiness on feet (R26.81);Muscle weakness (generalized) (M62.81)    Time: 1856-3149 PT Time Calculation (min) (ACUTE ONLY): 25 min   Charges:   PT Evaluation $PT Eval Low Complexity: 1 Low PT Treatments $Gait Training: 8-22 mins  Anise Salvo, PT Acute Rehab Services Pager [phone] [facility] [phone]   Rayetta Humphrey 08/29/2021, 1:41 PM

## 2021-08-29 NOTE — ED Notes (Signed)
Report received 

## 2021-08-29 NOTE — Progress Notes (Signed)
  Echocardiogram 2D Echocardiogram has been performed.  Gerda Diss 08/29/2021, 5:32 PM

## 2021-08-29 NOTE — Evaluation (Addendum)
Occupational Therapy Evaluation Patient Details Name: Claire Mcgrath MRN: [id number] DOB: 1946-09-13 Today's Date: 08/29/2021   History of Present Illness Claire Mcgrath is an 75 y.o. female  ED via EMS after acute onset of BLE weakness and incoordination. CT (-) acute stroke;  remote infarct posterior limb L internal CapsuleStroke workout underway. PMH: Left PCA CVA 2017, CHF, DM, HLD, HTN, stroke, heart block s/p pacemaker, paroxysmal atrial fibrillation on Eliquis heart block with pacemaker   Clinical Impression   Addisen was evaluated s/p the above bilat LE incoordination and weakness. PTA she was mod I with intermittent use of AD as needed. 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 Upon evaluation pt experienced 3 posterior LOB upon standing which she required min-mod A for, she is a high fall risk. Pt ambulated with NT prior to this session with min guard and RW only; seemingly inconsistent with performance level. After initial LOB pt became emotional and anxious in regards to her functional decline. ADLs and mobility with RA are requiring up to mod A at this time. Pt would benefit from acute OT to address the deficits and limitations listed below. Recommend home with HHOT and 24/7 support pending pt's acute progression with balance and safety.      Recommendations for follow up therapy are one component of a multi-disciplinary discharge planning process, led by the attending physician.  Recommendations may be updated based on patient status, additional functional criteria and insurance authorization.   Follow Up Recommendations  Home health OT;Supervision/Assistance - 24 hour (HHOT pending pt progress acutely and 247 assistance availability. If pt does not progress and is unable to have necessary support at home, she may need short SNF stay.)    Equipment Recommendations  3 in 1 bedside commode    Recommendations for Other Services       Precautions /  Restrictions Precautions Precautions: Fall Restrictions Weight Bearing Restrictions: No      Mobility Bed Mobility Overal bed mobility: Needs Assistance Bed Mobility: Supine to Sit;Sit to Supine     Supine to sit: Supervision Sit to supine: Supervision   General bed mobility comments: supervision fro safety    Transfers Overall transfer level: Needs assistance Equipment used: Rolling walker (2 wheeled) Transfers: Sit to/from Stand Sit to Stand: Min assist;Mod assist         General transfer comment: Pt required vc for hand placement with RW. Min-mod assist for balance upon standing, pt able to power up into standing well. Pt had 3 posterior LOB this session and required min-mod physical assist to correct.    Balance Overall balance assessment: Needs assistance Sitting-balance support: Feet supported Sitting balance-Leahy Scale: Good     Standing balance support: Bilateral upper extremity supported Standing balance-Leahy Scale: Poor Standing balance comment: pt with 3 posterior LOB                           ADL either performed or assessed with clinical judgement   ADL Overall ADL's : Needs assistance/impaired Eating/Feeding: NPO   Grooming: Set up;Sitting   Upper Body Bathing: Set up;Sitting   Lower Body Bathing: Minimal assistance Lower Body Bathing Details (indicate cue type and reason): min A for balance in standing during standing task Upper Body Dressing : Set up;Sitting Upper Body Dressing Details (indicate cue type and reason): min A for balance in standing during task Lower Body Dressing: Minimal assistance;Sit to/from stand  Toilet Transfer: RW;Ambulation;Moderate assistance Toilet Transfer Details (indicate cue type and reason): mod A due to posterior LOB Toileting- Clothing Manipulation and Hygiene: Min guard;Sitting/lateral lean       Functional mobility during ADLs: Moderate assistance;Rolling walker General ADL Comments: min-mod  A for ADLs due to posterior LOB several time sthroughout the session and required physical assist to correct     Vision Baseline Vision/History: 1 Wears glasses Patient Visual Report: Other (comment) (pt reports "blurry" peripheral vision; to be further assessed)       Perception     Praxis      Pertinent Vitals/Pain Pain Assessment: Faces Faces Pain Scale: No hurt Pain Intervention(s): Monitored during session     Hand Dominance Right   Extremity/Trunk Assessment Upper Extremity Assessment Upper Extremity Assessment: Overall WFL for tasks assessed (slightly limited bialt shoulder over head ROM)   Lower Extremity Assessment Lower Extremity Assessment: Overall WFL for tasks assessed   Cervical / Trunk Assessment Cervical / Trunk Assessment: Kyphotic (mild)   Communication Communication Communication: No difficulties   Cognition Arousal/Alertness: Awake/alert Behavior During Therapy: WFL for tasks assessed/performed;Anxious Overall Cognitive Status: Within Functional Limits for tasks assessed                                 General Comments: pt anxious and emotional throughout session due to aparent balance deficits   General Comments  Pt BP was soft at the start of the session, stable upon sitting and standing. She reported slight dizziness throughout the entire session.    Exercises     Shoulder Instructions      Home Living Family/patient expects to be discharged to:: Private residence Living Arrangements: Spouse/significant other Available Help at Discharge: Available PRN/intermittently;Friend(s) Type of Home: House Home Access: Level entry     Home Layout: One level     Bathroom Shower/Tub: Tub/shower unit;Curtain   Bathroom Toilet: Handicapped height Bathroom Accessibility: Yes How Accessible: Accessible via walker (sideways) Home Equipment: Walker - 2 wheels;Grab bars - tub/shower;Shower seat          Prior  Functioning/Environment Level of Independence: Independent        Comments: cares for her husband; drives; manages finances/medicaitons        OT Problem List: Decreased activity tolerance;Impaired balance (sitting and/or standing);Decreased safety awareness;Pain      OT Treatment/Interventions: Self-care/ADL training;Therapeutic exercise;DME and/or AE instruction;Therapeutic activities;Patient/family education;Balance training    OT Goals(Current goals can be found in the care plan section) Acute Rehab OT Goals Patient Stated Goal: to figure out whats going on with her balance OT Goal Formulation: With patient Time For Goal Achievement: 09/12/21 Potential to Achieve Goals: Fair ADL Goals Pt Will Perform Grooming: with modified independence;standing Pt Will Perform Lower Body Bathing: with modified independence;sit to/from stand Pt Will Perform Lower Body Dressing: with modified independence;sit to/from stand Pt Will Transfer to Toilet: with modified independence;ambulating Pt Will Perform Toileting - Clothing Manipulation and hygiene: with modified independence;sitting/lateral leans  OT Frequency: Min 2X/week   Barriers to D/C: Decreased caregiver support  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 Her sisters live in [place], and may be able to come stay with pt       Co-evaluation              AM-PAC OT "6 Clicks" Daily Activity     Outcome Measure Help from  another person eating meals?: Total (NPO) Help from another person taking care of personal grooming?: None Help from another person toileting, which includes using toliet, bedpan, or urinal?: A Little Help from another person bathing (including washing, rinsing, drying)?: A Little Help from another person to put on and taking off regular upper body clothing?: None Help from another person to put on and taking off regular lower body clothing?: A  Little 6 Click Score: 18   End of Session Equipment Utilized During Treatment: Gait belt;Rolling walker Nurse Communication: Mobility status;Precautions  Activity Tolerance: Patient tolerated treatment well Patient left: in bed;with call bell/phone within reach;with nursing/sitter in room  OT Visit Diagnosis: Unsteadiness on feet (R26.81);Other abnormalities of gait and mobility (R26.89);Pain                Time: 9935-7017 OT Time Calculation (min): 33 min Charges:  OT General Charges $OT Visit: 1 Visit OT Evaluation $OT Eval Moderate Complexity: 1 Mod OT Treatments $Therapeutic Activity: 8-22 mins   Matilde Pottenger A Maryfrances Portugal 08/29/2021, 12:13 PM

## 2021-08-29 NOTE — ED Notes (Addendum)
PT finished with pt at [facility]. Dr. Pearlean Brownie neuro in to see, at [facility].

## 2021-08-29 NOTE — ED Notes (Signed)
Informed provider that pt has had a sudden onset of nausea.  He will place orders

## 2021-08-29 NOTE — ED Notes (Addendum)
Up to b/r, steady gait with walker, denies dizziness or weakness. Pharmacy called to address protonix delay, med unavailable.

## 2021-08-30 ENCOUNTER — Observation Stay (HOSPITAL_COMMUNITY): Payer: Medicare Other

## 2021-08-30 DIAGNOSIS — J4520 Mild intermittent asthma, uncomplicated: Secondary | ICD-10-CM | POA: Diagnosis not present

## 2021-08-30 DIAGNOSIS — I639 Cerebral infarction, unspecified: Secondary | ICD-10-CM | POA: Diagnosis not present

## 2021-08-30 LAB — CBG MONITORING, ED
Glucose-Capillary: 118 mg/dL — ABNORMAL HIGH (ref 70–99)
Glucose-Capillary: 73 mg/dL (ref 70–99)
Glucose-Capillary: 118 mg/dL — ABNORMAL HIGH (ref 70–99)

## 2021-08-30 LAB — COMPREHENSIVE METABOLIC PANEL
ALT: 15 U/L (ref 0–44)
AST: 14 U/L — ABNORMAL LOW (ref 15–41)
Albumin: 3.3 g/dL — ABNORMAL LOW (ref 3.5–5.0)
Alkaline Phosphatase: 43 U/L (ref 38–126)
Anion gap: 10 (ref 5–15)
BUN: 18 mg/dL (ref 8–23)
CO2: 22 mmol/L (ref 22–32)
Calcium: 8.9 mg/dL (ref 8.9–10.3)
Chloride: 106 mmol/L (ref 98–111)
Creatinine, Ser: 1.05 mg/dL — ABNORMAL HIGH (ref 0.44–1.00)
GFR, Estimated: 55 mL/min — ABNORMAL LOW (ref >60)
Glucose, Bld: 88 mg/dL (ref 70–99)
Potassium: 4.1 mmol/L (ref 3.5–5.1)
Sodium: 138 mmol/L (ref 135–145)
Total Bilirubin: 0.8 mg/dL (ref 0.3–1.2)
Total Protein: 7 g/dL (ref 6.5–8.1)

## 2021-08-30 LAB — CBC
HCT: 37.7 % (ref 36.0–46.0)
Hemoglobin: 11.6 g/dL — ABNORMAL LOW (ref 12.0–15.0)
MCH: 27.2 pg (ref 26.0–34.0)
MCHC: 30.8 g/dL (ref 30.0–36.0)
MCV: 88.5 fL (ref 80.0–100.0)
Platelets: 263 10*3/uL (ref 150–400)
RBC: 4.26 MIL/uL (ref 3.87–5.11)
RDW: 15.3 % (ref 11.5–15.5)
WBC: 8.6 10*3/uL (ref 4.0–10.5)
nRBC: 0 % (ref 0.0–0.2)

## 2021-08-30 NOTE — Progress Notes (Signed)
Occupational Therapy Treatment Patient Details Name: Claire Mcgrath MRN: [id number] DOB: 1946-08-19 Today's Date: 08/30/2021   History of present illness Claire Mcgrath is an 75 y.o. female  presented to ED via EMS on 08/28/21 after acute onset of BLE weakness and incoordination. CT (-) acute stroke;  remote infarct posterior limb L internal Capsule; CVA workup underway. PMH: Left PCA CVA 2017, CHF, DM, HLD, HTN, stroke, heart block s/p pacemaker, paroxysmal atrial fibrillation on Eliquis heart block with pacemaker   OT comments  Pt is progressing well with improved balance and safety this session; pt did not experience any posterior LOB or knee buckling this session during functional ambulation with RW or Adls. Educated pt on use of rollator to increased safety and energy conservation. Pt is safe to d.c home with HHOT services.    Recommendations for follow up therapy are one component of a multi-disciplinary discharge planning process, led by the attending physician.  Recommendations may be updated based on patient status, additional functional criteria and insurance authorization.    Follow Up Recommendations  Home health OT;Supervision/Assistance - 24 hour    Equipment Recommendations  3 in 1 bedside commode (Rollator with a seat)       Precautions / Restrictions Precautions Precautions: Fall Restrictions Weight Bearing Restrictions: No       Mobility Bed Mobility Overal bed mobility: Needs Assistance Bed Mobility: Sit to Supine       Sit to supine: Supervision   General bed mobility comments: Sitting in chair upon entry    Transfers Overall transfer level: Needs assistance Equipment used: Rolling walker (2 wheeled) Transfers: Sit to/from Stand Sit to Stand: Min guard         General transfer comment: performed 4x sit<>stant without LOB    Balance Overall balance assessment: Needs assistance Sitting-balance support: Feet supported Sitting balance-Leahy Scale:  Good     Standing balance support: Bilateral upper extremity supported Standing balance-Leahy Scale: Fair Standing balance comment: pt completed functional ambulation with BUE supported on RW; pt was able to statically stand without UE support                           ADL either performed or assessed with clinical judgement   ADL Overall ADL's : Needs assistance/impaired                         Toilet Transfer: RW;Ambulation;Min guard Toilet Transfer Details (indicate cue type and reason): min guard for safety only, no LOB this session Toileting- Clothing Manipulation and Hygiene: Supervision/safety;Sit to/from stand       Functional mobility during ADLs: Min guard;Rolling walker General ADL Comments: pt demonstrated imprpoved ability to safety complete ADLs and functional mobility with no LOB     Vision       Perception     Praxis      Cognition Arousal/Alertness: Awake/alert Behavior During Therapy: WFL for tasks assessed/performed Overall Cognitive Status: Within Functional Limits for tasks assessed                                                General Comments pt reported mild dizziness after ambulation, VSS on RA    Pertinent Vitals/ Pain       Pain Assessment: No/denies pain Faces Pain Scale: No  hurt Pain Intervention(s): Limited activity within patient's tolerance;Monitored during session   Frequency  Min 2X/week        Progress Toward Goals  OT Goals(current goals can now be found in the care plan section)  Progress towards OT goals: Progressing toward goals  Acute Rehab OT Goals Patient Stated Goal: to figure out what was going on with balance/weakness OT Goal Formulation: With patient Time For Goal Achievement: 09/12/21 Potential to Achieve Goals: Fair ADL Goals Pt Will Perform Grooming: with modified independence;standing Pt Will Perform Lower Body Bathing: with modified independence;sit to/from  stand Pt Will Perform Lower Body Dressing: with modified independence;sit to/from stand Pt Will Transfer to Toilet: with modified independence;ambulating Pt Will Perform Toileting - Clothing Manipulation and hygiene: with modified independence;sitting/lateral leans  Plan Discharge plan remains appropriate    Co-evaluation                 AM-PAC OT "6 Clicks" Daily Activity     Outcome Measure   Help from another person eating meals?: None Help from another person taking care of personal grooming?: None Help from another person toileting, which includes using toliet, bedpan, or urinal?: None Help from another person bathing (including washing, rinsing, drying)?: A Little Help from another person to put on and taking off regular upper body clothing?: None Help from another person to put on and taking off regular lower body clothing?: None 6 Click Score: 23    End of Session Equipment Utilized During Treatment: Gait belt;Rolling walker  OT Visit Diagnosis: Unsteadiness on feet (R26.81);Other abnormalities of gait and mobility (R26.89);Pain   Activity Tolerance Patient tolerated treatment well   Patient Left in bed;with call bell/phone within reach   Nurse Communication Mobility status        Time: 1030-1100 OT Time Calculation (min): 30 min  Charges: OT General Charges $OT Visit: 1 Visit OT Treatments $Self Care/Home Management : 23-37 mins   Antoinette Haskett A Kiven Vangilder 08/30/2021, 11:24 AM

## 2021-08-30 NOTE — ED Notes (Signed)
Orange juice provided while waiting for pt's lunch. B/S 73. Pt denies any symptoms.

## 2021-08-30 NOTE — Discharge Summary (Signed)
Physician Discharge Summary  Claire Mcgrath POE:[id number] DOB: 1946/10/30 DOA: 08/28/2021  PCP: Ralene Ok, MD  Admit date: 08/28/2021 Discharge date: 08/30/2021  Admitted From: Home Disposition:  Home  Recommendations for Outpatient Follow-up:  Follow up with PCP in 1-2 weeks  Home Health:PT, OT  Equipment/Devices:3 in 1, rollator    Discharge Condition:Stable CODE STATUS:Full Diet recommendation: Regular, diabetic   Brief/Interim Summary: 75 y.o. female with medical history significant of  cerebrovascular accident, heart block, s/p pacemaker, PAF on Eliquis, chronic heart failure presenting to emergency department with leg weakness and  incoordination.  Patient reports he took a nap around 5 PM, woke up around 5:30 PM, and is having difficulty standing up.  She feels like both of her legs are weak and discoordinated.  She describes some lightheadedness as well.  She reports the symptoms are very similar to when she had her prior stroke.  She called EMS who activated code stroke based on this history.  At time she arrived in the ER she denies lightheadedness, she continues to feel weak in her legs.  She denies numbness.  She denies difficulty speaking.  She reports she has had intermittent headache for a week and felt unusually fatigued.  She also reports she has some nausea earlier today which resolved.   Discharge Diagnoses:  Active Problems:   Acute ischemic stroke (HCC)   DM2 (diabetes mellitus, type 2) (HCC)   HTN (hypertension)   Asthma   CHF (congestive heart failure) (HCC)   History of complete heart block   CVA (cerebral vascular accident) (HCC)   Acute CVA Presented with post circulation TIA despite eliquis for afib Discussed with RT, Patient's pacemaker is not comparable with MRI Neurology following. Recommendations for Eliquis Recommendations for HHPT noted by PT 2d echo with normal LVEF and no WMA Repeat CT reviewed without acute intracranial  abnormality Discussed with Neurology, recommendation to continue anticoagulation on d/c   2. DM2 Cont on SSI coverage as needed Cont scheduled insulin as tolerated   3. Chronic CHF  Appears to be compensated at this time Continue beta blocker as tolerated   4. HTN  BP stable at this time 2. Cont on metoprolol as tolerated   5. Asthma - stable. No wheezing.    6. Afib             1. Rate controlled at this time             2. Pt is continued on eliquis             3. Cont metoprolol on d/c   Discharge Instructions   Allergies as of 08/30/2021       Reactions   Kiwi Extract Anaphylaxis   Feel my throat close up   Iodine Itching, Swelling      Shrimp [shellfish Allergy] Itching, Swelling        Medication List     STOP taking these medications    doxycycline 100 MG capsule Commonly known as: VIBRAMYCIN       TAKE these medications    Eliquis 5 MG Tabs tablet Generic drug: apixaban Take 5 mg by mouth 2 (two) times daily.   fluticasone furoate-vilanterol 100-25 MCG/INH Aepb Commonly known as: BREO ELLIPTA Inhale 1 puff into the lungs daily as needed (shortness of breath, wheezing).   Insulin Lispro Prot & Lispro (75-25) 100 UNIT/ML Kwikpen Commonly known as: HUMALOG 75/25 MIX Inject 20 Units into the skin 2 (two) times daily. Take 20 units  in the am and 12 in the pm   lisinopril-hydrochlorothiazide 10-12.5 MG tablet Commonly known as: ZESTORETIC Take 1 tablet by mouth at bedtime.   metFORMIN 1000 MG tablet Commonly known as: GLUCOPHAGE Take 1,000 mg by mouth at bedtime.   metoprolol succinate 25 MG 24 hr tablet Commonly known as: TOPROL-XL Take 25 mg by mouth at bedtime.   pravastatin 20 MG tablet Commonly known as: PRAVACHOL Take 10 mg by mouth at bedtime.               Durable Medical Equipment  (From admission, onward)           Start     Ordered   08/30/21 1516  For home use only DME 3 n 1  Once        08/30/21 1515    08/30/21 1515  For home use only DME 4 wheeled rolling walker with seat  Once       Question:  Patient needs a walker to treat with the following condition  Answer:  Stroke [facility])   08/30/21 1515            Follow-up Information     Ralene Ok, MD Follow up in 2 week(s).   Specialty: Internal Medicine Why: Hospital follow up Contact information: [address] [place] [address] [phone]                Allergies  Allergen Reactions   Kiwi Extract Anaphylaxis    Feel my throat close up   Iodine Itching and Swelling        Shrimp [Shellfish Allergy] Itching and Swelling    Consultations: Neurology  Procedures/Studies: CT HEAD WO CONTRAST ( )  Result Date: 08/30/2021 CLINICAL DATA:  Follow-up stroke EXAM: CT HEAD WITHOUT CONTRAST TECHNIQUE: Contiguous axial images were obtained from the base of the skull through the vertex without intravenous contrast. COMPARISON:  08/28/2021 FINDINGS: Brain: No evidence of acute infarction, hemorrhage, hydrocephalus, extra-axial collection or mass lesion/mass effect. Subcortical white matter and periventricular small vessel ischemic changes. Vascular: Mild intracranial atherosclerosis. Skull: Normal. Negative for fracture or focal lesion. Sinuses/Orbits: The visualized paranasal sinuses are essentially clear. The mastoid air cells are unopacified. Other: None. IMPRESSION: No evidence of acute intracranial abnormality. Small vessel ischemic changes. Electronically Signed   By: Charline Bills M.D.   On: 08/30/2021 02:14   ECHOCARDIOGRAM COMPLETE  Result Date: 08/29/2021    ECHOCARDIOGRAM REPORT   Patient Name:   Claire Mcgrath Date of Exam: 08/29/2021 Medical Rec #:  [id number]      Height:       64.0 in Accession #:    [id number]     Weight:       198.4 lb Date of Birth:  19-May-1946     BSA:          1.950 m Patient Age:    74 years       BP:           115/52 mmHg Patient Gender: F              HR:           59 bpm. Exam  Location:  Inpatient Procedure: 2D Echo, Cardiac Doppler and Color Doppler Indications:    Stroke  History:        Patient has prior history of Echocardiogram examinations, most                 recent 09/06/2016. Pacemaker; Arrythmias:Atrial Fibrillation.  H/O                 CVA.  Sonographer:    Ross Ludwig RDCS (AE) Referring Phys: [id number] MICHAEL E NORINS IMPRESSIONS  1. Left ventricular ejection fraction, by estimation, is 60 to 65%. The left ventricle has normal function. The left ventricle has no regional wall motion abnormalities. There is moderate left ventricular hypertrophy. Left ventricular diastolic function  could not be evaluated.  2. Right ventricular systolic function is normal. The right ventricular size is normal. There is normal pulmonary artery systolic pressure. The estimated right ventricular systolic pressure is 23.4 mmHg.  3. The mitral valve is abnormal. Trivial mitral valve regurgitation.  4. The aortic valve is tricuspid. Aortic valve regurgitation is not visualized. Mild aortic valve sclerosis is present, with no evidence of aortic valve stenosis. Aortic valve mean gradient measures 3.0 mmHg.  5. The inferior vena cava is dilated in size with >50% respiratory variability, suggesting right atrial pressure of 8 mmHg. FINDINGS  Left Ventricle: Left ventricular ejection fraction, by estimation, is 60 to 65%. The left ventricle has normal function. The left ventricle has no regional wall motion abnormalities. The left ventricular internal cavity size was normal in size. There is  moderate left ventricular hypertrophy. Left ventricular diastolic function could not be evaluated due to atrial fibrillation. Left ventricular diastolic function could not be evaluated. Right Ventricle: The right ventricular size is normal. No increase in right ventricular wall thickness. Right ventricular systolic function is normal. There is normal pulmonary artery systolic pressure. The tricuspid regurgitant velocity is  1.96 m/s, and  with an assumed right atrial pressure of 8 mmHg, the estimated right ventricular systolic pressure is 23.4 mmHg. Left Atrium: Left atrial size was normal in size. Right Atrium: Right atrial size was normal in size. Pericardium: There is no evidence of pericardial effusion. Mitral Valve: The mitral valve is abnormal. There is mild thickening of the mitral valve leaflet(s). Trivial mitral valve regurgitation. Tricuspid Valve: The tricuspid valve is grossly normal. Tricuspid valve regurgitation is trivial. Aortic Valve: The aortic valve is tricuspid. Aortic valve regurgitation is not visualized. Mild aortic valve sclerosis is present, with no evidence of aortic valve stenosis. Aortic valve mean gradient measures 3.0 mmHg. Aortic valve peak gradient measures 4.8 mmHg. Aortic valve area, by VTI measures 1.57 cm. Pulmonic Valve: The pulmonic valve was normal in structure. Pulmonic valve regurgitation is not visualized. Aorta: The aortic root and ascending aorta are structurally normal, with no evidence of dilitation. Venous: The inferior vena cava is dilated in size with greater than 50% respiratory variability, suggesting right atrial pressure of 8 mmHg. IAS/Shunts: No atrial level shunt detected by color flow Doppler.  LEFT VENTRICLE PLAX 2D LVIDd:         4.20 cm  Diastology LVIDs:         3.00 cm  LV e' medial:    10.60 cm/s LV PW:         1.40 cm  LV E/e' medial:  9.4 LV IVS:        1.50 cm  LV e' lateral:   12.70 cm/s LVOT diam:     1.90 cm  LV E/e' lateral: 7.8 LV SV:         41 LV SV Index:   21 LVOT Area:     2.84 cm  RIGHT VENTRICLE            IVC RV Basal diam:  2.50 cm    IVC  diam: 2.20 cm RV S prime:     8.85 cm/s TAPSE (M-mode): 2.1 cm LEFT ATRIUM             Index       RIGHT ATRIUM          Index LA diam:        2.80 cm 1.44 cm/m  RA Area:     9.35 cm LA Vol (A2C):   48.4 ml 24.82 ml/m RA Volume:   18.20 ml 9.33 ml/m LA Vol (A4C):   37.2 ml 19.08 ml/m LA Biplane Vol: 43.9 ml 22.52  ml/m  AORTIC VALVE AV Area (Vmax):    1.63 cm AV Area (Vmean):   1.57 cm AV Area (VTI):     1.57 cm AV Vmax:           109.00 cm/s AV Vmean:          79.100 cm/s AV VTI:            0.258 m AV Peak Grad:      4.8 mmHg AV Mean Grad:      3.0 mmHg LVOT Vmax:         62.50 cm/s LVOT Vmean:        43.900 cm/s LVOT VTI:          0.143 m LVOT/AV VTI ratio: 0.55  AORTA Ao Root diam: 3.10 cm Ao Asc diam:  2.90 cm MITRAL VALVE               TRICUSPID VALVE MV Area (PHT): 3.60 cm    TR Peak grad:   15.4 mmHg MV Decel Time: 211 msec    TR Vmax:        196.00 cm/s MV E velocity: 99.60 cm/s MV A velocity: 34.60 cm/s  SHUNTS MV E/A ratio:  2.88        Systemic VTI:  0.14 m                            Systemic Diam: 1.90 cm Claire Shutter MD Electronically signed by Claire Shutter MD Signature Date/Time: 08/29/2021/5:51:43 PM    Final    CT HEAD CODE STROKE WO CONTRAST  Result Date: 08/28/2021 CLINICAL DATA:  Code stroke.  Neuro deficit, acute, stroke suspected EXAM: CT HEAD WITHOUT CONTRAST TECHNIQUE: Contiguous axial images were obtained from the base of the skull through the vertex without intravenous contrast. COMPARISON:  Multiple priors, most recent October 06, 2016. FINDINGS: Brain: No evidence of acute large vascular territory infarction, hemorrhage, hydrocephalus, extra-axial collection or mass lesion/mass effect. Remote infarct in the posterior limb of the left internal capsule. Additional mild patchy white matter hypoattenuation, nonspecific but compatible with chronic microvascular ischemic disease. Vascular: No hyperdense vessel identified. Skull: No acute fracture. Sinuses/Orbits: Clear visualized sinuses. No acute orbital findings. Other: No mastoid effusions. ASPECTS [facility] Stroke Program Early CT Score) total score (0-10 with 10 being normal): 10. IMPRESSION: 1. No evidence of acute large vascular territory infarct or acute hemorrhage. ASPECTS is 10. 2. Remote infarct in the posterior limb of the left  internal capsule. 3. Mild chronic microvascular ischemic disease. 4. Code stroke imaging results were communicated on 08/28/2021 at 7:06 pm to provider Dr. Otelia Limes via secure text paging. Electronically Signed   By: Feliberto Harts M.D.   On: 08/28/2021 19:08   CT ANGIO HEAD CODE STROKE  Result Date: 08/28/2021 CLINICAL DATA:  Neuro deficit, acute, stroke suspected EXAM:  CT ANGIOGRAPHY HEAD AND NECK TECHNIQUE: Multidetector CT imaging of the head and neck was performed using the standard protocol during bolus administration of intravenous contrast. Multiplanar CT image reconstructions and MIPs were obtained to evaluate the vascular anatomy. Carotid stenosis measurements (when applicable) are obtained utilizing NASCET criteria, using the distal internal carotid diameter as the denominator. CONTRAST:  75mL OMNIPAQUE IOHEXOL 350 MG/ML SOLN COMPARISON:  None. 09/05/2016. FINDINGS: CTA NECK FINDINGS Aortic arch: Atherosclerosis of the aorta. Great vessel origins are patent. Right carotid system: Ulcerated atherosclerosis at the carotid bifurcation without greater than 50% stenosis, similar to prior. Left carotid system: Ulcerated atherosclerosis at the carotid bifurcation without greater than 50% stenosis, similar to prior. Vertebral arteries: Mild proximal vertebral arteries narrowing bilaterally, similar to prior. No significant (greater than 50%) stenosis. Codominant. Skeleton: No acute osseous abnormality identified. Degenerative change in the visualized thoracic spine. Other neck: Enlarged, heterogeneous thyroid with multiple nodules, measuring up to 2 cm anteriorly. Upper chest: Paraseptal emphysema. No consolidation in the visualized lung apices. Review of the MIP images confirms the above findings CTA HEAD FINDINGS Anterior circulation: Bilateral intracranial ICAs are patent with similar up to moderate stenosis bilaterally due to calcific atherosclerosis. Bilateral MCAs and ACAs are patent without proximal  hemodynamically significant stenosis. No aneurysm identified. Posterior circulation: Save draft bilateral intradural vertebral arteries, basilar artery, and posterior cerebral arteries are patent. Similar moderate left P2 PCA stenosis. Bilateral PICAs are patent. No aneurysm identified. Venous sinuses: As permitted by contrast timing, patent. Anatomic variants: None. Review of the MIP images confirms the above findings IMPRESSION: 1. No large vessel occlusion. 2. Similar moderate left P2 PCA stenosis. 3. Similar moderate bilateral intracranial ICA stenosis. 4. Similar bilateral carotid bifurcation atherosclerosis without greater than 50% stenosis. 5. Similar mild proximal vertebral artery stenosis bilaterally. 6. Enlarged and heterogeneous thyroid with multiple nodules, measuring up to 2 cm anteriorly. Recommend thyroid US (Ref: J Am Coll Radiol. 2015 Feb;12(2): 143-50). 7. Aortic Atherosclerosis (ICD10-I70.0) and Emphysema (ICD10-J43.9). Electronically Signed   By: Feliberto Harts M.D.   On: 08/28/2021 19:42   CT ANGIO NECK CODE STROKE  Result Date: 08/28/2021 CLINICAL DATA:  Neuro deficit, acute, stroke suspected EXAM: CT ANGIOGRAPHY HEAD AND NECK TECHNIQUE: Multidetector CT imaging of the head and neck was performed using the standard protocol during bolus administration of intravenous contrast. Multiplanar CT image reconstructions and MIPs were obtained to evaluate the vascular anatomy. Carotid stenosis measurements (when applicable) are obtained utilizing NASCET criteria, using the distal internal carotid diameter as the denominator. CONTRAST:  75mL OMNIPAQUE IOHEXOL 350 MG/ML SOLN COMPARISON:  None. 09/05/2016. FINDINGS: CTA NECK FINDINGS Aortic arch: Atherosclerosis of the aorta. Great vessel origins are patent. Right carotid system: Ulcerated atherosclerosis at the carotid bifurcation without greater than 50% stenosis, similar to prior. Left carotid system: Ulcerated atherosclerosis at the carotid  bifurcation without greater than 50% stenosis, similar to prior. Vertebral arteries: Mild proximal vertebral arteries narrowing bilaterally, similar to prior. No significant (greater than 50%) stenosis. Codominant. Skeleton: No acute osseous abnormality identified. Degenerative change in the visualized thoracic spine. Other neck: Enlarged, heterogeneous thyroid with multiple nodules, measuring up to 2 cm anteriorly. Upper chest: Paraseptal emphysema. No consolidation in the visualized lung apices. Review of the MIP images confirms the above findings CTA HEAD FINDINGS Anterior circulation: Bilateral intracranial ICAs are patent with similar up to moderate stenosis bilaterally due to calcific atherosclerosis. Bilateral MCAs and ACAs are patent without proximal hemodynamically significant stenosis. No aneurysm identified. Posterior circulation: Save draft bilateral intradural  vertebral arteries, basilar artery, and posterior cerebral arteries are patent. Similar moderate left P2 PCA stenosis. Bilateral PICAs are patent. No aneurysm identified. Venous sinuses: As permitted by contrast timing, patent. Anatomic variants: None. Review of the MIP images confirms the above findings IMPRESSION: 1. No large vessel occlusion. 2. Similar moderate left P2 PCA stenosis. 3. Similar moderate bilateral intracranial ICA stenosis. 4. Similar bilateral carotid bifurcation atherosclerosis without greater than 50% stenosis. 5. Similar mild proximal vertebral artery stenosis bilaterally. 6. Enlarged and heterogeneous thyroid with multiple nodules, measuring up to 2 cm anteriorly. Recommend thyroid US (Ref: J Am Coll Radiol. 2015 Feb;12(2): 143-50). 7. Aortic Atherosclerosis (ICD10-I70.0) and Emphysema (ICD10-J43.9). Electronically Signed   By: Feliberto Harts M.D.   On: 08/28/2021 19:42    Subjective: Eager to go home  Discharge Exam: Vitals:   08/30/21 1029 08/30/21 1230  BP: 135/65 (!) 109/52  Pulse: 60 (!) 59  Resp: 18 19   Temp:    SpO2: 98% 100%   Vitals:   08/30/21 0630 08/30/21 0756 08/30/21 1029 08/30/21 1230  BP: (!) 146/55 134/64 135/65 (!) 109/52  Pulse: 60 64 60 (!) 59  Resp: Temp:  98.7 F (37.1 C)    TempSrc:      SpO2: 100% 96% 98% 100%  Weight:      Height:        General: Pt is alert, awake, not in acute distress Cardiovascular: RRR, S1/S2 + Respiratory: CTA bilaterally, no wheezing, no rhonchi Abdominal: Soft, NT, ND, bowel sounds + Extremities: no edema, no cyanosis   The results of significant diagnostics from this hospitalization (including imaging, microbiology, ancillary and laboratory) are listed below for reference.     Microbiology: Recent Results (from the past 240 hour(s))  Resp Panel by RT-PCR (Flu A&B, Covid) Nasopharyngeal Swab     Status: None   Collection Time: 08/28/21  6:51 PM   Specimen: Nasopharyngeal Swab; Nasopharyngeal(NP) swabs in vial transport medium  Result Value Ref Range Status   SARS Coronavirus 2 by RT PCR NEGATIVE NEGATIVE Final    Comment: (NOTE) SARS-CoV-2 target nucleic acids are NOT DETECTED.  The SARS-CoV-2 RNA is generally detectable in upper respiratory specimens during the acute phase of infection. The lowest concentration of SARS-CoV-2 viral copies this assay can detect is 138 copies/mL. A negative result does not preclude SARS-Cov-2 infection and should not be used as the sole basis for treatment or other patient management decisions. A negative result may occur with  improper specimen collection/handling, submission of specimen other than nasopharyngeal swab, presence of viral mutation(s) within the areas targeted by this assay, and inadequate number of viral copies(<138 copies/mL). A negative result must be combined with clinical observations, patient history, and epidemiological information. The expected result is Negative.  Fact Sheet for Patients:  BloggerCourse.com  Fact Sheet for  Healthcare Providers:  SeriousBroker.it  This test is no t yet approved or cleared by the [place] FDA and  has been authorized for detection and/or diagnosis of SARS-CoV-2 by FDA under an Emergency Use Authorization (EUA). This EUA will remain  in effect (meaning this test can be used) for the duration of the COVID-19 declaration under Section 564(b)(1) of the Act, 21 U.S.C.section 360bbb-3(b)(1), unless the authorization is terminated  or revoked sooner.       Influenza A by PCR NEGATIVE NEGATIVE Final   Influenza B by PCR NEGATIVE NEGATIVE Final    Comment: (NOTE) The Xpert Xpress SARS-CoV-2/FLU/RSV plus assay is intended  as an aid in the diagnosis of influenza from Nasopharyngeal swab specimens and should not be used as a sole basis for treatment. Nasal washings and aspirates are unacceptable for Xpert Xpress SARS-CoV-2/FLU/RSV testing.  Fact Sheet for Patients: BloggerCourse.com  Fact Sheet for Healthcare Providers: SeriousBroker.it  This test is not yet approved or cleared by the [place] FDA and has been authorized for detection and/or diagnosis of SARS-CoV-2 by FDA under an Emergency Use Authorization (EUA). This EUA will remain in effect (meaning this test can be used) for the duration of the COVID-19 declaration under Section 564(b)(1) of the Act, 21 U.S.C. section 360bbb-3(b)(1), unless the authorization is terminated or revoked.  Performed at [facility], [address], [place], [place] [address]      Labs: BNP (last 3 results) No results for input(s): BNP in the last 8760 hours. Basic Metabolic Panel: Recent Labs  Lab 08/28/21 1851 08/28/21 1856 08/30/21 0500  NA 139 142 138  K 3.9 3.9 4.1  CL 107 106 106  CO2 23  --  22  GLUCOSE 86 87 88  BUN 21 22 18   CREATININE 1.21* 1.20* 1.05*  CALCIUM 9.5  --  8.9   Liver Function Tests: Recent Labs  Lab  08/28/21 1851 08/30/21 0500  AST 18 14*  ALT 15 15  ALKPHOS 46 43  BILITOT 0.6 0.8  PROT 8.0 7.0  ALBUMIN 3.8 3.3*   No results for input(s): LIPASE, AMYLASE in the last 168 hours. No results for input(s): AMMONIA in the last 168 hours. CBC: Recent Labs  Lab 08/28/21 1851 08/28/21 1856 08/30/21 0500  WBC 10.2  --  8.6  NEUTROABS 4.6  --   --   HGB 12.4 13.3 11.6*  HCT 39.1 39.0 37.7  MCV 87.7  --  88.5  PLT 274  --  263   Cardiac Enzymes: No results for input(s): CKTOTAL, CKMB, CKMBINDEX, TROPONINI in the last 168 hours. BNP: Invalid input(s): POCBNP CBG: Recent Labs  Lab 08/29/21 1133 08/29/21 1200 08/29/21 1645 08/30/21 0757 08/30/21 1211  GLUCAP 174* 152* 84 118* 73   D-Dimer No results for input(s): DDIMER in the last 72 hours. Hgb A1c Recent Labs    08/28/21 1851  HGBA1C 6.6*   Lipid Profile Recent Labs    08/29/21 0817  CHOL 122  HDL 46  LDLCALC 65  TRIG 56  CHOLHDL 2.7   Thyroid function studies No results for input(s): TSH, T4TOTAL, T3FREE, THYROIDAB in the last 72 hours.  Invalid input(s): FREET3 Anemia work up No results for input(s): VITAMINB12, FOLATE, FERRITIN, TIBC, IRON, RETICCTPCT in the last 72 hours. Urinalysis    Component Value Date/Time   COLORURINE YELLOW 08/29/2021 0232   APPEARANCEUR CLEAR 08/29/2021 0232   LABSPEC 1.010 08/29/2021 0232   PHURINE 5.5 08/29/2021 0232   GLUCOSEU NEGATIVE 08/29/2021 0232   HGBUR NEGATIVE 08/29/2021 0232   BILIRUBINUR NEGATIVE 08/29/2021 0232   KETONESUR NEGATIVE 08/29/2021 0232   PROTEINUR NEGATIVE 08/29/2021 0232   NITRITE NEGATIVE 08/29/2021 0232   LEUKOCYTESUR NEGATIVE 08/29/2021 0232   Sepsis Labs Invalid input(s): PROCALCITONIN,  WBC,  LACTICIDVEN Microbiology Recent Results (from the past 240 hour(s))  Resp Panel by RT-PCR (Flu A&B, Covid) Nasopharyngeal Swab     Status: None   Collection Time: 08/28/21  6:51 PM   Specimen: Nasopharyngeal Swab; Nasopharyngeal(NP) swabs in  vial transport medium  Result Value Ref Range Status   SARS Coronavirus 2 by RT PCR NEGATIVE NEGATIVE Final  Comment: (NOTE) SARS-CoV-2 target nucleic acids are NOT DETECTED.  The SARS-CoV-2 RNA is generally detectable in upper respiratory specimens during the acute phase of infection. The lowest concentration of SARS-CoV-2 viral copies this assay can detect is 138 copies/mL. A negative result does not preclude SARS-Cov-2 infection and should not be used as the sole basis for treatment or other patient management decisions. A negative result may occur with  improper specimen collection/handling, submission of specimen other than nasopharyngeal swab, presence of viral mutation(s) within the areas targeted by this assay, and inadequate number of viral copies(<138 copies/mL). A negative result must be combined with clinical observations, patient history, and epidemiological information. The expected result is Negative.  Fact Sheet for Patients:  BloggerCourse.com  Fact Sheet for Healthcare Providers:  SeriousBroker.it  This test is no t yet approved or cleared by the [place] FDA and  has been authorized for detection and/or diagnosis of SARS-CoV-2 by FDA under an Emergency Use Authorization (EUA). This EUA will remain  in effect (meaning this test can be used) for the duration of the COVID-19 declaration under Section 564(b)(1) of the Act, 21 U.S.C.section 360bbb-3(b)(1), unless the authorization is terminated  or revoked sooner.       Influenza A by PCR NEGATIVE NEGATIVE Final   Influenza B by PCR NEGATIVE NEGATIVE Final    Comment: (NOTE) The Xpert Xpress SARS-CoV-2/FLU/RSV plus assay is intended as an aid in the diagnosis of influenza from Nasopharyngeal swab specimens and should not be used as a sole basis for treatment. Nasal washings and aspirates are unacceptable for Xpert Xpress SARS-CoV-2/FLU/RSV testing.  Fact  Sheet for Patients: BloggerCourse.com  Fact Sheet for Healthcare Providers: SeriousBroker.it  This test is not yet approved or cleared by the [place] FDA and has been authorized for detection and/or diagnosis of SARS-CoV-2 by FDA under an Emergency Use Authorization (EUA). This EUA will remain in effect (meaning this test can be used) for the duration of the COVID-19 declaration under Section 564(b)(1) of the Act, 21 U.S.C. section 360bbb-3(b)(1), unless the authorization is terminated or revoked.  Performed at [facility], [address] [place], [place] [address]    Time spent:17min  SIGNED:   Rickey Barbara, MD  [facility] 08/30/2021, 3:27 PM  If 7PM-7AM, please contact night-coverage

## 2021-08-30 NOTE — ED Notes (Signed)
Informed bed mgt of possible dc. Per bed mgt, will keep the bed for now until DC is final. Per Dr. Rhona Leavens, he wants to make sure home health is set up prior to dc.

## 2021-08-30 NOTE — Progress Notes (Signed)
STROKE TEAM PROGRESS NOTE   INTERVAL HISTORY Patient is sitting in the bedside chair.  She is in good spirits.  She wants to go home.  Repeat CT scan of the head shows no acute abnormality.  Echocardiogram shows normal ejection fraction of 60 to 65% without wall motion abnormalities. Vital signs are stable.  Neurological exam is unchanged.  Vitals:   08/30/21 0630 08/30/21 0756 08/30/21 1029 08/30/21 1230  BP: (!) 146/55 134/64 135/65 (!) 109/52  Pulse: 60 64 60 (!) 59  Resp: 18 14 18 19   Temp:  98.7 F (37.1 C)    TempSrc:      SpO2: 100% 96% 98% 100%  Weight:      Height:       CBC:  Recent Labs  Lab 08/28/21 1851 08/28/21 1856 08/30/21 0500  WBC 10.2  --  8.6  NEUTROABS 4.6  --   --   HGB 12.4 13.3 11.6*  HCT 39.1 39.0 37.7  MCV 87.7  --  88.5  PLT 274  --  263   Basic Metabolic Panel:  Recent Labs  Lab 08/28/21 1851 08/28/21 1856 08/30/21 0500  NA 139 142 138  K 3.9 3.9 4.1  CL 107 106 106  CO2 23  --  22  GLUCOSE 86 87 88  BUN 21 22 18   CREATININE 1.21* 1.20* 1.05*  CALCIUM 9.5  --  8.9    Lipid Panel:  Recent Labs  Lab 08/29/21 0817  CHOL 122  TRIG 56  HDL 46  CHOLHDL 2.7  VLDL 11  LDLCALC 65    HgbA1c:  Recent Labs  Lab 08/28/21 1851  HGBA1C 6.6*   Urine Drug Screen:  Recent Labs  Lab 08/29/21 0233  LABOPIA NONE DETECTED  COCAINSCRNUR NONE DETECTED  LABBENZ NONE DETECTED  AMPHETMU NONE DETECTED  THCU NONE DETECTED  LABBARB NONE DETECTED    Alcohol Level  Recent Labs  Lab 08/28/21 1851  ETH <10    IMAGING past 24 hours CT HEAD WO CONTRAST (10/29/21)  Result Date: 08/30/2021 CLINICAL DATA:  Follow-up stroke EXAM: CT HEAD WITHOUT CONTRAST TECHNIQUE: Contiguous axial images were obtained from the base of the skull through the vertex without intravenous contrast. COMPARISON:  08/28/2021 FINDINGS: Brain: No evidence of acute infarction, hemorrhage, hydrocephalus, extra-axial collection or mass lesion/mass effect. Subcortical white matter  and periventricular small vessel ischemic changes. Vascular: Mild intracranial atherosclerosis. Skull: Normal. Negative for fracture or focal lesion. Sinuses/Orbits: The visualized paranasal sinuses are essentially clear. The mastoid air cells are unopacified. Other: None. IMPRESSION: No evidence of acute intracranial abnormality. Small vessel ischemic changes. Electronically Signed   By: 09/01/2021 M.D.   On: 08/30/2021 02:14   ECHOCARDIOGRAM COMPLETE  Result Date: 08/29/2021    ECHOCARDIOGRAM REPORT   Patient Name:   Claire Mcgrath Date of Exam: 08/29/2021 Medical Rec #:  Larey Seat      Height:       64.0 in Accession #:    10/29/2021     Weight:       198.4 lb Date of Birth:  11-09-1946     BSA:          1.950 m Patient Age:    74 years       BP:           115/52 mmHg Patient Gender: F              HR:           59  bpm. Exam Location:  Inpatient Procedure: 2D Echo, Cardiac Doppler and Color Doppler Indications:    Stroke  History:        Patient has prior history of Echocardiogram examinations, most                 recent 09/06/2016. Pacemaker; Arrythmias:Atrial Fibrillation. H/O                 CVA.  Sonographer:    Ross Ludwig RDCS (AE) Referring Phys: [id number] MICHAEL E NORINS IMPRESSIONS  1. Left ventricular ejection fraction, by estimation, is 60 to 65%. The left ventricle has normal function. The left ventricle has no regional wall motion abnormalities. There is moderate left ventricular hypertrophy. Left ventricular diastolic function  could not be evaluated.  2. Right ventricular systolic function is normal. The right ventricular size is normal. There is normal pulmonary artery systolic pressure. The estimated right ventricular systolic pressure is 23.4 mmHg.  3. The mitral valve is abnormal. Trivial mitral valve regurgitation.  4. The aortic valve is tricuspid. Aortic valve regurgitation is not visualized. Mild aortic valve sclerosis is present, with no evidence of aortic valve stenosis. Aortic  valve mean gradient measures 3.0 mmHg.  5. The inferior vena cava is dilated in size with >50% respiratory variability, suggesting right atrial pressure of 8 mmHg. FINDINGS  Left Ventricle: Left ventricular ejection fraction, by estimation, is 60 to 65%. The left ventricle has normal function. The left ventricle has no regional wall motion abnormalities. The left ventricular internal cavity size was normal in size. There is  moderate left ventricular hypertrophy. Left ventricular diastolic function could not be evaluated due to atrial fibrillation. Left ventricular diastolic function could not be evaluated. Right Ventricle: The right ventricular size is normal. No increase in right ventricular wall thickness. Right ventricular systolic function is normal. There is normal pulmonary artery systolic pressure. The tricuspid regurgitant velocity is 1.96 m/s, and  with an assumed right atrial pressure of 8 mmHg, the estimated right ventricular systolic pressure is 23.4 mmHg. Left Atrium: Left atrial size was normal in size. Right Atrium: Right atrial size was normal in size. Pericardium: There is no evidence of pericardial effusion. Mitral Valve: The mitral valve is abnormal. There is mild thickening of the mitral valve leaflet(s). Trivial mitral valve regurgitation. Tricuspid Valve: The tricuspid valve is grossly normal. Tricuspid valve regurgitation is trivial. Aortic Valve: The aortic valve is tricuspid. Aortic valve regurgitation is not visualized. Mild aortic valve sclerosis is present, with no evidence of aortic valve stenosis. Aortic valve mean gradient measures 3.0 mmHg. Aortic valve peak gradient measures 4.8 mmHg. Aortic valve area, by VTI measures 1.57 cm. Pulmonic Valve: The pulmonic valve was normal in structure. Pulmonic valve regurgitation is not visualized. Aorta: The aortic root and ascending aorta are structurally normal, with no evidence of dilitation. Venous: The inferior vena cava is dilated in size  with greater than 50% respiratory variability, suggesting right atrial pressure of 8 mmHg. IAS/Shunts: No atrial level shunt detected by color flow Doppler.  LEFT VENTRICLE PLAX 2D LVIDd:         4.20 cm  Diastology LVIDs:         3.00 cm  LV e' medial:    10.60 cm/s LV PW:         1.40 cm  LV E/e' medial:  9.4 LV IVS:        1.50 cm  LV e' lateral:   12.70 cm/s LVOT diam:  1.90 cm  LV E/e' lateral: 7.8 LV SV:         41 LV SV Index:   21 LVOT Area:     2.84 cm  RIGHT VENTRICLE            IVC RV Basal diam:  2.50 cm    IVC diam: 2.20 cm RV S prime:     8.85 cm/s TAPSE (M-mode): 2.1 cm LEFT ATRIUM             Index       RIGHT ATRIUM          Index LA diam:        2.80 cm 1.44 cm/m  RA Area:     9.35 cm LA Vol (A2C):   48.4 ml 24.82 ml/m RA Volume:   18.20 ml 9.33 ml/m LA Vol (A4C):   37.2 ml 19.08 ml/m LA Biplane Vol: 43.9 ml 22.52 ml/m  AORTIC VALVE AV Area (Vmax):    1.63 cm AV Area (Vmean):   1.57 cm AV Area (VTI):     1.57 cm AV Vmax:           109.00 cm/s AV Vmean:          79.100 cm/s AV VTI:            0.258 m AV Peak Grad:      4.8 mmHg AV Mean Grad:      3.0 mmHg LVOT Vmax:         62.50 cm/s LVOT Vmean:        43.900 cm/s LVOT VTI:          0.143 m LVOT/AV VTI ratio: 0.55  AORTA Ao Root diam: 3.10 cm Ao Asc diam:  2.90 cm MITRAL VALVE               TRICUSPID VALVE MV Area (PHT): 3.60 cm    TR Peak grad:   15.4 mmHg MV Decel Time: 211 msec    TR Vmax:        196.00 cm/s MV E velocity: 99.60 cm/s MV A velocity: 34.60 cm/s  SHUNTS MV E/A ratio:  2.88        Systemic VTI:  0.14 m                            Systemic Diam: 1.90 cm Zoila Shutter MD Electronically signed by Zoila Shutter MD Signature Date/Time: 08/29/2021/5:51:43 PM    Final     PHYSICAL EXAM Pleasant elderly lady not in distress. . Afebrile. Head is nontraumatic. Neck is supple without bruit.    Cardiac exam no murmur or gallop. Lungs are clear to auscultation. Distal pulses are well felt.   Neurological Examination Mental  Status: Awake and alert. Oriented x 4. Speech fluent with intact naming for all items given and comprehension for all basic commands. Repetition intact. No dysarthria.   Cranial Nerves: II: Temporal visual fields intact with no extinction to DSS. PERRL  III,IV, VI: no ptosis, extra-ocular motions intact bilaterally  V: Temp sensation equal bilaterally.  VII: Smile symmetric. Cheek puffing normal.  VIII: hearing normal bilaterally IX,X: No hypophonia XI: Symmetric XII: midline tongue extension  Motor: RUE able to hold antigravity for > 10 seconds with no drift and with 5/5 strength proximally and distally.  LUE 5/5 proximally and distally. No drift RLE 5/5 strength proximally and distally for short durations. LLE 5/5 proximally and distally with  most trials. Some giveway occurs that resolves with repeated coaching.  Bulk is normal and symmetric x 4.   Sensory: Temp and light touch intact throughout, bilaterally. No extinction to DSS.  Deep Tendon Reflexes: 2+ and symmetric bilateral upper extremities. Trace patellar reflexes bilaterally. Absent achilles reflexes bilaterally.  Plantars:  Right: downgoing                          Left: downgoing Cerebellar:  RUE: No ataxia with FNF and normal RAM.  LUE: Initially with subtle ataxic movement on FNF that then resolves. Dysdiadochokinesia with RAM.  Gait: Deferred due to falls risk concerns  ASSESSMENT/PLAN Claire Mcgrath is a 75 y.o. female with history of  CHF, DM, HLD, HTN, stroke, heart block s/p pacemaker, paroxysmal atrial fibrillation on Eliquis who presents to the ED via EMS after acute onset of BLE weakness and incoordination. LKN was 5 PM when she took a nap. On awakening at 5:30 PM she had difficulty standing up due to sensation of weak legs with incoordination. She also endorses lightheadedness but when she took her BP it was elevated with SBP in 170s. She felt that the above symptoms were very similar to those of her prior  stroke, so EMS was called. On arrival to the ED she endorsed continued BLE weakness but resolution of the lightheadedness. No speech deficit, vision problems or sensory numbness. Endorses intermittent headache for the past week. Also with fatigue and some nausea earlier today.    Per EMS, initial BP was 200/100, CBG 88, O2 sats normal.   IV thrombolytic not administered due to DOAC use with last dose yesterday.    Posterior circulation TIA:   Code Stroke  CT head No acute abnormality.  Small vessel disease. Atrophy.  ASPECTS 10.     CTA head & neck: 1. No large vessel occlusion. 2. Similar moderate left P2 PCA stenosis. 3. Similar moderate bilateral intracranial ICA stenosis. 4. Similar bilateral carotid bifurcation atherosclerosis without greater than 50% stenosis. 5. Similar mild proximal vertebral artery stenosis bilaterally.  MRI  ppm not compatible.  2D Echo ejection fraction 60 to 65%.  No wall motion abnormalities. LDL 65 HgbA1c 6.6 VTE prophylaxis -  scd    Diet   Diet heart healthy/carb modified Room service appropriate? Yes; Fluid consistency: Thin   Eliquis 5mg  prior to admission, now on Eliquis (apixaban) daily.   Therapy recommendations: Home health PT  disposition: Home Hypertension Home meds:  lisinopril-hctz, metoprolol  Stable Permissive hypertension (OK if < 220/120) but gradually normalize in 5-7 days Long-term BP goal normotensive  Hyperlipidemia Home meds:  pravastatin,  resumed in hospital LDL 65, goal < 70  Continue statin at discharge  Diabetes type II Controlled Home meds:  metformin 1gm bid HgbA1c 6.6, goal < 7.0 CBGs Recent Labs    08/29/21 1645 08/30/21 0757 08/30/21 1211  GLUCAP 84 118* 73    SSI  Other Stroke Risk Factors  Advanced Age >/= 65     Obesity, Body mass index is 34.06 kg/m., BMI >/= 30 associated with increased stroke risk, recommend weight loss, diet and exercise as appropriate   Hx stroke/TIA  Other Active  Problems  . DM2- continue home regimen plus SS   Chronic CHF - well compensated. Will continue home meds   HTN transiently hypertensive during transport now normal  Asthma - stable. No wheezing.     Hospital day # 0 Patient presented with a posterior  circulation TIA and CT scan done twice is negative for acute abnormality and MRI cannot be done as she has a incompatible pacemaker.  I had a long discussion with patient with regards to alternatives to Eliquis for anticoagulation in the setting of A. fib and also lack of definitive data suggesting superiority of the other agents or addition of aspirin.  Recommend continue Eliquis and the current dose patient advised to be compliant with taking 5 mg twice daily.  Aggressive risk factor modification.  Discharge home with home therapies.  Follow-up as an outpatient stroke clinic in 2 months greater than 50% time during this 25-minute visit was spent on counseling and coordination of care and discussion with care team and answering questions.  Discussed with Dr. Rhona Leavens.  Delia Heady, MD Medical Director [facility] Pager: [phone] 08/30/2021 3:13 PM     To contact Stroke Continuity provider, please refer to [email or web address]. After hours, contact General Neurology

## 2021-08-30 NOTE — Progress Notes (Signed)
Physical Therapy Treatment Patient Details Name: Claire Mcgrath MRN: [id number] DOB: [birth year] Today's Date: 08/30/2021   History of Present Illness Claire Mcgrath is an 75 y.o. female  presented to ED via EMS on 08/28/21 after acute onset of BLE weakness and incoordination. CT (-) acute stroke;  remote infarct posterior limb L internal Capsule; CVA workup underway. PMH: Left PCA CVA 2017, CHF, DM, HLD, HTN, stroke, heart block s/p pacemaker, paroxysmal atrial fibrillation on Eliquis heart block with pacemaker    PT Comments    Pt progressing towards goals and increased ambulation distance this session. However, with increased distance, pt reporting BLE weakness and buckling. Required min A when experiencing buckling; otherwise required min guard A with use of RW. Required 3 standing rests this session. Also experienced during standing HEP. Discussed using rollator at home for increased safety. Current recommendations appropriate. Will continue to follow acutely.     Recommendations for follow up therapy are one component of a multi-disciplinary discharge planning process, led by the attending physician.  Recommendations may be updated based on patient status, additional functional criteria and insurance authorization.  Follow Up Recommendations  Home health PT;Supervision for mobility/OOB     Equipment Recommendations  Other (comment) (rollator (4 wheeled with seat))    Recommendations for Other Services       Precautions / Restrictions Precautions Precautions: Fall Restrictions Weight Bearing Restrictions: No     Mobility  Bed Mobility Overal bed mobility: Needs Assistance Bed Mobility: Sit to Supine       Sit to supine: Supervision   General bed mobility comments: Sitting in chair upon entry    Transfers Overall transfer level: Needs assistance Equipment used: Rolling walker (2 wheeled) Transfers: Sit to/from Stand Sit to Stand: Min guard         General  transfer comment: Min guard for safety.  Ambulation/Gait Ambulation/Gait assistance: Min guard;Min assist Gait Distance (Feet): 100 Feet Assistive device: Rolling walker (2 wheeled) Gait Pattern/deviations: Step-to pattern;Decreased stride length;Trunk flexed Gait velocity: Decreased   General Gait Details: Pt initially tolerated well and required min guard A for mobility using RW. On ambulation back to room, pt requiring 3 standing rest breaks and reporting buckling. Min A for steadying with knee buckling.   Stairs             Wheelchair Mobility    Modified Rankin (Stroke Patients Only)       Balance Overall balance assessment: Needs assistance Sitting-balance support: Feet supported Sitting balance-Leahy Scale: Good     Standing balance support: Bilateral upper extremity supported Standing balance-Leahy Scale: Poor Standing balance comment: Reliant on BUE support                            Cognition Arousal/Alertness: Awake/alert Behavior During Therapy: WFL for tasks assessed/performed Overall Cognitive Status: Within Functional Limits for tasks assessed                                        Exercises General Exercises - Lower Extremity Long Arc Quad: AROM;Both;10 reps;Seated Hip Flexion/Marching: AROM;Both;10 reps;Standing (marching) Heel Raises: AROM;Both;5 reps;Standing (using RW. Buckling noted on rep 5, so further reps deferred.)    General Comments General comments (skin integrity, edema, etc.): pt reported mild dizziness after ambulation, VSS on RA      Pertinent Vitals/Pain Pain Assessment: No/denies pain  Faces Pain Scale: No hurt Pain Intervention(s): Limited activity within patient's tolerance;Monitored during session    Home Living                      Prior Function            PT Goals (current goals can now be found in the care plan section) Acute Rehab PT Goals Patient Stated Goal: to figure out  what was going on with balance/weakness PT Goal Formulation: With patient Time For Goal Achievement: 09/12/21 Potential to Achieve Goals: Good Progress towards PT goals: Progressing toward goals    Frequency    Min 4X/week      PT Plan Current plan remains appropriate    Co-evaluation              AM-PAC PT "6 Clicks" Mobility   Outcome Measure  Help needed turning from your back to your side while in a flat bed without using bedrails?: None Help needed moving from lying on your back to sitting on the side of a flat bed without using bedrails?: None Help needed moving to and from a bed to a chair (including a wheelchair)?: A Little Help needed standing up from a chair using your arms (e.g., wheelchair or bedside chair)?: A Little Help needed to walk in hospital room?: A Little Help needed climbing 3-5 steps with a railing? : A Lot 6 Click Score: 19    End of Session Equipment Utilized During Treatment: Gait belt Activity Tolerance: Patient tolerated treatment well Patient left: in chair;with call bell/phone within reach Nurse Communication: Mobility status PT Visit Diagnosis: Unsteadiness on feet (R26.81);Muscle weakness (generalized) (M62.81)     Time: 8110-3159 PT Time Calculation (min) (ACUTE ONLY): 16 min  Charges:  $Gait Training: 8-22 mins                     Farley Ly, PT, DPT  Acute Rehabilitation Services  Pager: [phone] Office: [phone]    Lehman Prom 08/30/2021, 1:24 PM

## 2021-08-30 NOTE — ED Notes (Signed)
Pt disconnected from the monitor, ambulated to the bathroom with walker, self assist. Steady gait.

## 2021-08-30 NOTE — ED Notes (Signed)
Contacted SW re: home health arrangement/ pending discharge. SW to call this RN back whether there's home health arranged or pt has to stay the night.

## 2021-08-30 NOTE — ED Notes (Signed)
Pt now calling her daughter to pick her up for dc

## 2021-08-30 NOTE — Social Work (Signed)
CSW met with Pt at bedside. Pt is agreeable to [facility].  Referrals will be sent out. Informed Pt that referral will be sent, may take 24-48 hours for a reply.  TOC will send referral.

## 2021-09-09 ENCOUNTER — Other Ambulatory Visit: Payer: Self-pay | Admitting: Internal Medicine

## 2021-09-09 DIAGNOSIS — E042 Nontoxic multinodular goiter: Secondary | ICD-10-CM

## 2021-09-26 ENCOUNTER — Ambulatory Visit
Admission: RE | Admit: 2021-09-26 | Discharge: 2021-09-26 | Disposition: A | Discharge status: Home / Self Care | Payer: Medicare Other | Source: Ambulatory Visit | Attending: Internal Medicine | Admitting: Internal Medicine

## 2021-09-26 DIAGNOSIS — E042 Nontoxic multinodular goiter: Secondary | ICD-10-CM

## 2021-10-12 ENCOUNTER — Other Ambulatory Visit: Payer: Self-pay | Admitting: Internal Medicine

## 2021-10-12 DIAGNOSIS — E049 Nontoxic goiter, unspecified: Secondary | ICD-10-CM

## 2021-10-13 ENCOUNTER — Other Ambulatory Visit: Payer: Self-pay | Admitting: Internal Medicine

## 2021-10-13 DIAGNOSIS — E049 Nontoxic goiter, unspecified: Secondary | ICD-10-CM

## 2021-11-15 ENCOUNTER — Ambulatory Visit
Admission: RE | Admit: 2021-11-15 | Discharge: 2021-11-15 | Disposition: A | Discharge status: Home / Self Care | Payer: Medicare Other | Source: Ambulatory Visit | Attending: Internal Medicine | Admitting: Internal Medicine

## 2021-11-15 ENCOUNTER — Other Ambulatory Visit (HOSPITAL_COMMUNITY)
Admission: RE | Admit: 2021-11-15 | Discharge: 2021-11-15 | Disposition: A | Discharge status: Home / Self Care | Payer: Medicare Other | Source: Ambulatory Visit | Attending: Internal Medicine | Admitting: Internal Medicine

## 2021-11-15 DIAGNOSIS — E041 Nontoxic single thyroid nodule: Secondary | ICD-10-CM | POA: Diagnosis present

## 2021-11-15 DIAGNOSIS — E049 Nontoxic goiter, unspecified: Secondary | ICD-10-CM

## 2021-11-16 LAB — CYTOLOGY - NON PAP

## 2022-04-05 ENCOUNTER — Other Ambulatory Visit
Admission: RE | Admit: 2022-04-05 | Discharge: 2022-04-05 | Disposition: A | Discharge status: Home / Self Care | Payer: Medicare Other | Source: Ambulatory Visit | Attending: Physician Assistant | Admitting: Physician Assistant

## 2022-04-05 DIAGNOSIS — R5383 Other fatigue: Secondary | ICD-10-CM | POA: Insufficient documentation

## 2022-04-05 DIAGNOSIS — R0609 Other forms of dyspnea: Secondary | ICD-10-CM | POA: Diagnosis present

## 2022-04-05 DIAGNOSIS — I472 Ventricular tachycardia, unspecified: Secondary | ICD-10-CM | POA: Diagnosis present

## 2022-04-05 LAB — BRAIN NATRIURETIC PEPTIDE: B Natriuretic Peptide: 63 pg/mL (ref 0.0–100.0)

## 2022-04-10 ENCOUNTER — Telehealth: Payer: Self-pay

## 2022-04-10 NOTE — Telephone Encounter (Signed)
NOTES SCANNED TO REFERRAL 

## 2022-04-11 ENCOUNTER — Other Ambulatory Visit: Payer: Self-pay | Admitting: Internal Medicine

## 2022-04-11 ENCOUNTER — Ambulatory Visit
Admission: RE | Admit: 2022-04-11 | Discharge: 2022-04-11 | Disposition: A | Discharge status: Home / Self Care | Payer: Medicare Other | Source: Ambulatory Visit | Attending: Internal Medicine | Admitting: Internal Medicine

## 2022-04-11 DIAGNOSIS — R0602 Shortness of breath: Secondary | ICD-10-CM

## 2022-04-24 ENCOUNTER — Telehealth: Payer: Self-pay

## 2022-04-24 NOTE — Telephone Encounter (Signed)
NOTES SCANNED TO REFERRAL 

## 2022-06-01 ENCOUNTER — Other Ambulatory Visit: Payer: Self-pay

## 2022-06-01 ENCOUNTER — Ambulatory Visit: Payer: Medicare Other | Admitting: Cardiology

## 2022-06-01 ENCOUNTER — Inpatient Hospital Stay (HOSPITAL_COMMUNITY): Payer: Medicare Other

## 2022-06-01 ENCOUNTER — Emergency Department (HOSPITAL_COMMUNITY): Payer: Medicare Other

## 2022-06-01 ENCOUNTER — Inpatient Hospital Stay (HOSPITAL_COMMUNITY)
Admission: EM | Admit: 2022-06-01 | Discharge: 2022-06-03 | DRG: 225 | Disposition: A | Discharge status: Home / Self Care | Payer: Medicare Other | Attending: Internal Medicine | Admitting: Internal Medicine

## 2022-06-01 ENCOUNTER — Encounter: Payer: Self-pay | Admitting: Cardiology

## 2022-06-01 ENCOUNTER — Encounter (HOSPITAL_COMMUNITY): Payer: Self-pay

## 2022-06-01 VITALS — BP 164/68 | HR 81 | Ht 64.0 in | Wt 208.2 lb

## 2022-06-01 DIAGNOSIS — I5032 Chronic diastolic (congestive) heart failure: Secondary | ICD-10-CM

## 2022-06-01 DIAGNOSIS — I4901 Ventricular fibrillation: Principal | ICD-10-CM | POA: Diagnosis present

## 2022-06-01 DIAGNOSIS — Z8673 Personal history of transient ischemic attack (TIA), and cerebral infarction without residual deficits: Secondary | ICD-10-CM | POA: Diagnosis not present

## 2022-06-01 DIAGNOSIS — I480 Paroxysmal atrial fibrillation: Secondary | ICD-10-CM | POA: Diagnosis present

## 2022-06-01 DIAGNOSIS — N1831 Chronic kidney disease, stage 3a: Secondary | ICD-10-CM | POA: Diagnosis present

## 2022-06-01 DIAGNOSIS — Z91013 Allergy to seafood: Secondary | ICD-10-CM

## 2022-06-01 DIAGNOSIS — I472 Ventricular tachycardia, unspecified: Secondary | ICD-10-CM

## 2022-06-01 DIAGNOSIS — E119 Type 2 diabetes mellitus without complications: Secondary | ICD-10-CM

## 2022-06-01 DIAGNOSIS — E785 Hyperlipidemia, unspecified: Secondary | ICD-10-CM | POA: Diagnosis present

## 2022-06-01 DIAGNOSIS — E1122 Type 2 diabetes mellitus with diabetic chronic kidney disease: Secondary | ICD-10-CM | POA: Diagnosis present

## 2022-06-01 DIAGNOSIS — I442 Atrioventricular block, complete: Secondary | ICD-10-CM | POA: Diagnosis present

## 2022-06-01 DIAGNOSIS — F17210 Nicotine dependence, cigarettes, uncomplicated: Secondary | ICD-10-CM | POA: Diagnosis present

## 2022-06-01 DIAGNOSIS — Z823 Family history of stroke: Secondary | ICD-10-CM | POA: Diagnosis not present

## 2022-06-01 DIAGNOSIS — Z8679 Personal history of other diseases of the circulatory system: Secondary | ICD-10-CM

## 2022-06-01 DIAGNOSIS — Z7901 Long term (current) use of anticoagulants: Secondary | ICD-10-CM

## 2022-06-01 DIAGNOSIS — I10 Essential (primary) hypertension: Secondary | ICD-10-CM

## 2022-06-01 DIAGNOSIS — I130 Hypertensive heart and chronic kidney disease with heart failure and stage 1 through stage 4 chronic kidney disease, or unspecified chronic kidney disease: Secondary | ICD-10-CM | POA: Diagnosis present

## 2022-06-01 DIAGNOSIS — Z4502 Encounter for adjustment and management of automatic implantable cardiac defibrillator: Secondary | ICD-10-CM

## 2022-06-01 DIAGNOSIS — I482 Chronic atrial fibrillation, unspecified: Secondary | ICD-10-CM | POA: Diagnosis present

## 2022-06-01 DIAGNOSIS — Z7951 Long term (current) use of inhaled steroids: Secondary | ICD-10-CM | POA: Diagnosis not present

## 2022-06-01 DIAGNOSIS — Z950 Presence of cardiac pacemaker: Secondary | ICD-10-CM | POA: Diagnosis not present

## 2022-06-01 DIAGNOSIS — J449 Chronic obstructive pulmonary disease, unspecified: Secondary | ICD-10-CM | POA: Diagnosis present

## 2022-06-01 DIAGNOSIS — I639 Cerebral infarction, unspecified: Secondary | ICD-10-CM

## 2022-06-01 DIAGNOSIS — Z8249 Family history of ischemic heart disease and other diseases of the circulatory system: Secondary | ICD-10-CM | POA: Diagnosis not present

## 2022-06-01 DIAGNOSIS — Z79899 Other long term (current) drug therapy: Secondary | ICD-10-CM | POA: Diagnosis not present

## 2022-06-01 DIAGNOSIS — Z888 Allergy status to other drugs, medicaments and biological substances status: Secondary | ICD-10-CM | POA: Diagnosis not present

## 2022-06-01 DIAGNOSIS — Z91018 Allergy to other foods: Secondary | ICD-10-CM

## 2022-06-01 DIAGNOSIS — Z7984 Long term (current) use of oral hypoglycemic drugs: Secondary | ICD-10-CM | POA: Diagnosis not present

## 2022-06-01 LAB — ECHOCARDIOGRAM COMPLETE
AR max vel: 1.87 cm2
AV Area VTI: 1.75 cm2
AV Area mean vel: 1.78 cm2
AV Mean grad: 3 mmHg
AV Peak grad: 4.9 mmHg
Ao pk vel: 1.11 m/s
Area-P 1/2: 7.9 cm2
Height: 64 in
S' Lateral: 2.9 cm
Weight: 3328 oz

## 2022-06-01 LAB — CBC WITH DIFFERENTIAL/PLATELET
Abs Immature Granulocytes: 0.05 10*3/uL (ref 0.00–0.07)
Basophils Absolute: 0.1 10*3/uL (ref 0.0–0.1)
Basophils Relative: 1 %
Eosinophils Absolute: 0.1 10*3/uL (ref 0.0–0.5)
Eosinophils Relative: 1 %
HCT: 37.5 % (ref 36.0–46.0)
Hemoglobin: 11.6 g/dL — ABNORMAL LOW (ref 12.0–15.0)
Immature Granulocytes: 1 %
Lymphocytes Relative: 31 %
Lymphs Abs: 3 10*3/uL (ref 0.7–4.0)
MCH: 26.9 pg (ref 26.0–34.0)
MCHC: 30.9 g/dL (ref 30.0–36.0)
MCV: 86.8 fL (ref 80.0–100.0)
Monocytes Absolute: 0.6 10*3/uL (ref 0.1–1.0)
Monocytes Relative: 6 %
Neutro Abs: 5.9 10*3/uL (ref 1.7–7.7)
Neutrophils Relative %: 60 %
Platelets: 274 10*3/uL (ref 150–400)
RBC: 4.32 MIL/uL (ref 3.87–5.11)
RDW: 14.5 % (ref 11.5–15.5)
WBC: 9.8 10*3/uL (ref 4.0–10.5)
nRBC: 0 % (ref 0.0–0.2)

## 2022-06-01 LAB — COMPREHENSIVE METABOLIC PANEL
ALT: 15 U/L (ref 0–44)
AST: 18 U/L (ref 15–41)
Albumin: 4 g/dL (ref 3.5–5.0)
Alkaline Phosphatase: 49 U/L (ref 38–126)
Anion gap: 12 (ref 5–15)
BUN: 19 mg/dL (ref 8–23)
CO2: 20 mmol/L — ABNORMAL LOW (ref 22–32)
Calcium: 9.3 mg/dL (ref 8.9–10.3)
Chloride: 107 mmol/L (ref 98–111)
Creatinine, Ser: 1.22 mg/dL — ABNORMAL HIGH (ref 0.44–1.00)
GFR, Estimated: 46 mL/min — ABNORMAL LOW (ref >60)
Glucose, Bld: 141 mg/dL — ABNORMAL HIGH (ref 70–99)
Potassium: 4.5 mmol/L (ref 3.5–5.1)
Sodium: 139 mmol/L (ref 135–145)
Total Bilirubin: 0.5 mg/dL (ref 0.3–1.2)
Total Protein: 8.3 g/dL — ABNORMAL HIGH (ref 6.5–8.1)

## 2022-06-01 LAB — TROPONIN I (HIGH SENSITIVITY)
Troponin I (High Sensitivity): 10 ng/L (ref <18)
Troponin I (High Sensitivity): 12 ng/L (ref <18)

## 2022-06-01 LAB — BRAIN NATRIURETIC PEPTIDE: B Natriuretic Peptide: 106.8 pg/mL — ABNORMAL HIGH (ref 0.0–100.0)

## 2022-06-01 MED ORDER — INSULIN LISPRO PROT & LISPRO (75-25 MIX) 100 UNIT/ML KWIKPEN: 20.0000 [IU] | PEN_INJECTOR | Freq: Two times a day (BID) | SUBCUTANEOUS | Status: DC

## 2022-06-01 MED ORDER — HYDROCHLOROTHIAZIDE 12.5 MG PO TABS
12.5000 mg | ORAL_TABLET | Freq: Every day | ORAL | Status: DC
  Administered 2022-06-01 – 2022-06-02 (×2): 12.5 mg via ORAL
  Filled 2022-06-01 (×2): qty 1

## 2022-06-01 MED ORDER — LISINOPRIL-HYDROCHLOROTHIAZIDE 10-12.5 MG PO TABS: 1.0000 | ORAL_TABLET | Freq: Every day | ORAL | Status: DC

## 2022-06-01 MED ORDER — SODIUM CHLORIDE 0.9 % WEIGHT BASED INFUSION
3.0000 mL/kg/h | INTRAVENOUS | Status: DC
  Administered 2022-06-02: 3 mL/kg/h via INTRAVENOUS

## 2022-06-01 MED ORDER — INSULIN ASPART PROT & ASPART (70-30 MIX) 100 UNIT/ML ~~LOC~~ SUSP
20.0000 [IU] | Freq: Every day | SUBCUTANEOUS | Status: DC
  Administered 2022-06-03: 20 [IU] via SUBCUTANEOUS
  Filled 2022-06-01: qty 10

## 2022-06-01 MED ORDER — LISINOPRIL 10 MG PO TABS
10.0000 mg | ORAL_TABLET | Freq: Every day | ORAL | Status: DC
  Administered 2022-06-01 – 2022-06-03 (×2): 10 mg via ORAL
  Filled 2022-06-01 (×2): qty 1

## 2022-06-01 MED ORDER — FLUTICASONE FUROATE-VILANTEROL 100-25 MCG/ACT IN AEPB
1.0000 | INHALATION_SPRAY | Freq: Every day | RESPIRATORY_TRACT | Status: DC | PRN
  Filled 2022-06-01: qty 28

## 2022-06-01 MED ORDER — SODIUM CHLORIDE 0.9% FLUSH: 3.0000 mL | INTRAVENOUS | Status: DC | PRN

## 2022-06-01 MED ORDER — SODIUM CHLORIDE 0.9% FLUSH
3.0000 mL | Freq: Two times a day (BID) | INTRAVENOUS | Status: DC
  Administered 2022-06-01: 3 mL via INTRAVENOUS

## 2022-06-01 MED ORDER — AMLODIPINE BESYLATE 2.5 MG PO TABS
2.5000 mg | ORAL_TABLET | Freq: Every day | ORAL | Status: DC
  Administered 2022-06-01 – 2022-06-03 (×2): 2.5 mg via ORAL
  Filled 2022-06-01 (×2): qty 1

## 2022-06-01 MED ORDER — SODIUM CHLORIDE 0.9% FLUSH: 3.0000 mL | Freq: Two times a day (BID) | INTRAVENOUS | Status: DC

## 2022-06-01 MED ORDER — ACETAMINOPHEN 325 MG PO TABS: 650.0000 mg | ORAL_TABLET | ORAL | Status: DC | PRN

## 2022-06-01 MED ORDER — SODIUM CHLORIDE 0.9 % IV SOLN: 250.0000 mL | INTRAVENOUS | Status: DC | PRN

## 2022-06-01 MED ORDER — PRAVASTATIN SODIUM 10 MG PO TABS
10.0000 mg | ORAL_TABLET | Freq: Every day | ORAL | Status: DC
  Administered 2022-06-01 – 2022-06-02 (×2): 10 mg via ORAL
  Filled 2022-06-01 (×2): qty 1

## 2022-06-01 MED ORDER — INSULIN ASPART PROT & ASPART (70-30 MIX) 100 UNIT/ML ~~LOC~~ SUSP
10.0000 [IU] | Freq: Every day | SUBCUTANEOUS | Status: DC
  Administered 2022-06-02: 10 [IU] via SUBCUTANEOUS
  Filled 2022-06-01 (×2): qty 10

## 2022-06-01 MED ORDER — SODIUM CHLORIDE 0.9 % WEIGHT BASED INFUSION
1.0000 mL/kg/h | INTRAVENOUS | Status: DC
  Administered 2022-06-02: 1 mL/kg/h via INTRAVENOUS

## 2022-06-01 MED ORDER — ASPIRIN 81 MG PO CHEW
81.0000 mg | CHEWABLE_TABLET | ORAL | Status: AC
  Administered 2022-06-02: 81 mg via ORAL
  Filled 2022-06-01: qty 1

## 2022-06-01 MED ORDER — SODIUM CHLORIDE 0.9 % IV SOLN: INTRAVENOUS | Status: DC

## 2022-06-01 MED ORDER — METOPROLOL SUCCINATE ER 25 MG PO TB24
25.0000 mg | ORAL_TABLET | Freq: Every day | ORAL | Status: DC
  Administered 2022-06-01 – 2022-06-02 (×2): 25 mg via ORAL
  Filled 2022-06-01 (×2): qty 1

## 2022-06-01 MED ORDER — HEPARIN (PORCINE) 25000 UT/250ML-% IV SOLN
950.0000 [IU]/h | INTRAVENOUS | Status: AC
  Administered 2022-06-01: 950 [IU]/h via INTRAVENOUS
  Filled 2022-06-01: qty 250

## 2022-06-01 MED ORDER — ONDANSETRON HCL 4 MG/2ML IJ SOLN: 4.0000 mg | Freq: Four times a day (QID) | INTRAMUSCULAR | Status: DC | PRN

## 2022-06-01 NOTE — ED Notes (Signed)
Pharmacy contacted by this RN about heparin levels for infusion that were to be collected prior to start. RPH notified this RN that levels will not be necessary at this time d/t pending d/c of medication at midnight for upcoming procedure.

## 2022-06-01 NOTE — ED Provider Notes (Signed)
[facility] EMERGENCY DEPARTMENT Provider Note   CSN: [id number] Arrival date & time: 06/01/22  1140     History  Chief Complaint  Patient presents with   Pacemaker Problem    Claire Mcgrath is a 76 y.o. female with a history of diabetes mellitus, hypertension, CHF, heart block with pacemaker, ventricular tachycardia, paroxysmal atrial fibrillation, hyperlipidemia, COPD.  Presents to the emergency department with a chief complaint of heart arrhythmia.  Patient was seen by cardiologist Dr. Lalla Brothers earlier this morning and sent to the emerge apartment for further evaluation.  Patient was sent to cardiology due to his maker showing runs of V. tach/V-fib.  Patient endorses shortness of breath.  States that this has been present for multiple weeks and gradually improving.  Patient states that she was treated for pneumonia.  Shortness of breath is primarily present with exertion.  Denies any fever, chills, chest pain, palpitations, leg swelling or tenderness, lightheadedness, syncope, hemoptysis, abdominal pain, nausea, vomiting.  HPI     Home Medications Prior to Admission medications   Medication Sig Start Date End Date Taking? Authorizing Provider  amLODipine (NORVASC) 2.5 MG tablet Take 2.5 mg by mouth daily. 05/22/22   [provider]  ELIQUIS 5 MG TABS tablet Take 5 mg by mouth 2 (two) times daily.  05/15/17   [provider]  fluticasone furoate-vilanterol (BREO ELLIPTA) 100-25 MCG/INH AEPB Inhale 1 puff into the lungs daily as needed (shortness of breath, wheezing).    [provider]  Insulin Lispro Prot & Lispro (HUMALOG 75/25 MIX) (75-25) 100 UNIT/ML Kwikpen Inject 20 Units into the skin 2 (two) times daily. Take 20 units in the am and 12 in the pm    [provider]  lisinopril-hydrochlorothiazide (PRINZIDE,ZESTORETIC) 10-12.5 MG tablet Take 1 tablet by mouth at bedtime.    [provider]  metFORMIN (GLUCOPHAGE)  1000 MG tablet Take 1,000 mg by mouth at bedtime.    [provider]  metoprolol succinate (TOPROL-XL) 25 MG 24 hr tablet Take 25 mg by mouth at bedtime. 05/09/17   [provider]  pravastatin (PRAVACHOL) 20 MG tablet Take 10 mg by mouth at bedtime.    [provider]      Allergies    Kiwi extract, Iodine, and Shrimp [shellfish allergy]    Review of Systems   Review of Systems  Constitutional:  Negative for chills and fever.  Eyes:  Negative for visual disturbance.  Respiratory:  Positive for shortness of breath. Negative for cough.   Cardiovascular:  Negative for chest pain, palpitations and leg swelling.  Gastrointestinal:  Negative for abdominal pain, nausea and vomiting.  Genitourinary:  Negative for dysuria.  Musculoskeletal:  Negative for back pain and neck pain.  Skin:  Negative for color change and rash.  Neurological:  Negative for dizziness, syncope, light-headedness and headaches.  Psychiatric/Behavioral:  Negative for confusion.     Physical Exam Updated Vital Signs BP (!) 135/57 (BP Location: Left Arm)   Pulse 60   Temp 97.8 F (36.6 C) (Oral)   Resp 18   Ht 5\' 4"  (1.626 m)   Wt 94.3 kg   SpO2 99%   BMI 35.70 kg/m  Physical Exam Vitals and nursing note reviewed.  Constitutional:      General: She is not in acute distress.    Appearance: She is not ill-appearing, toxic-appearing or diaphoretic.  HENT:     Head: Normocephalic.  Eyes:     General: No scleral icterus.  Right eye: No discharge.        Left eye: No discharge.  Cardiovascular:     Rate and Rhythm: Normal rate.     Pulses:          Radial pulses are 2+ on the right side and 2+ on the left side.  Pulmonary:     Effort: Pulmonary effort is normal. No tachypnea or bradypnea.     Breath sounds: Normal breath sounds. No stridor.  Musculoskeletal:     Right lower leg: Normal.     Left lower leg: Normal.  Skin:    General: Skin is warm and dry.  Neurological:      General: No focal deficit present.     Mental Status: She is alert.  Psychiatric:        Behavior: Behavior is cooperative.     ED Results / Procedures / Treatments   Labs (all labs ordered are listed, but only abnormal results are displayed) Labs Reviewed  COMPREHENSIVE METABOLIC PANEL - Abnormal; Notable for the following components:      Result Value   CO2 20 (*)    Glucose, Bld 141 (*)    Creatinine, Ser 1.22 (*)    Total Protein 8.3 (*)    GFR, Estimated 46 (*)    All other components within normal limits  CBC WITH DIFFERENTIAL/PLATELET - Abnormal; Notable for the following components:   Hemoglobin 11.6 (*)    All other components within normal limits  BRAIN NATRIURETIC PEPTIDE - Abnormal; Notable for the following components:   B Natriuretic Peptide 106.8 (*)    All other components within normal limits  TROPONIN I (HIGH SENSITIVITY)  TROPONIN I (HIGH SENSITIVITY)    EKG None  Radiology DG Chest 2 View  Result Date: 06/01/2022 CLINICAL DATA:  shob EXAM: CHEST - 2 VIEW COMPARISON:  April 11, 2022 FINDINGS: Again seen is the left subclavian dual lead pacemaker, stable. The heart size and mediastinal contours are within normal limits. Both lungs are clear. The visualized skeletal structures are unremarkable. IMPRESSION: No active cardiopulmonary disease. Electronically Signed   By: Marjo Bicker M.D.   On: 06/01/2022 12:25    Procedures Procedures    Medications Ordered in ED Medications - No data to display  ED Course/ Medical Decision Making/ A&P                           Medical Decision Making Amount and/or Complexity of Data Reviewed Labs: ordered. Radiology: ordered.  Risk Decision regarding hospitalization.   Alert 76 year old female in no acute distress, nontoxic-appearing.  Presents to the ED with a chief complaint of arrhythmia.  Information is obtained from patient.  I reviewed patient's past medical records including previous provider notes  from specialist, labs, and imaging.  Patient has medical history as outlined in HPI which complicates her care.  Per chart review patient was seen by cardiologist Dr. Lalla Brothers earlier this morning, since the emergency department for further evaluation due to reports of V. tach/V-fib on patient's pacemaker.  We will obtain basic lab work, EKG, and chest x-ray and consult cardiology.  I personally viewed interpret patient's EKG.  Tracing shows sinus tachycardia with complete heart block and ventricular paced rhythm.  I personally viewed and interpreted patient's chest x-ray.  No active cardiopulmonary disease.  I personally viewed and interpreted patient's lab results.  Pertinent findings include: -BNP 106 -Troponin 10 and 12 with delta of +2 -Creatinine 1.22  I spoke to cards master who advised that Dr. Lovena Le will be admitting the patient.  Will be seen by Dr. Lovena Le in cardiology team in the emergency department.          Final Clinical Impression(s) / ED Diagnoses Final diagnoses:  V-tach [facility])    Rx / DC Orders ED Discharge Orders     None         Dyann Ruddle 06/01/22 1534    Ezequiel Essex, MD 06/01/22 1643

## 2022-06-01 NOTE — ED Triage Notes (Addendum)
Sent by PCP due to something with her heart and needing to have defib instead of pacemaker. PCP interrogated pacemaker and showed vtach vfib at times.  Also recently getting over PNA.

## 2022-06-01 NOTE — Progress Notes (Addendum)
Electrophysiology Rounding Note  Patient Name: Claire Mcgrath Date of Encounter: 06/02/2022   Primary Cardiologist: None Electrophysiologist: Lanier Prude, MD   Subjective   NAEO. Initially refused consent but now says she was asleep and doesn't remember this at all.   Inpatient Medications    Scheduled Meds:  amLODipine  2.5 mg Oral Daily   hydrochlorothiazide  12.5 mg Oral QHS   insulin aspart protamine- aspart  10 Units Subcutaneous Q supper   insulin aspart protamine- aspart  20 Units Subcutaneous Q breakfast   lisinopril  10 mg Oral Daily   metoprolol succinate  25 mg Oral QHS   pravastatin  10 mg Oral QHS   sodium chloride flush  3 mL Intravenous Q12H   sodium chloride flush  3 mL Intravenous Q12H   Continuous Infusions:  sodium chloride     sodium chloride     sodium chloride 1 mL/kg/hr (06/02/22 0613)   PRN Meds: sodium chloride, sodium chloride, acetaminophen, fluticasone furoate-vilanterol, ondansetron (ZOFRAN) IV, sodium chloride flush, sodium chloride flush   Vital Signs    Vitals:   06/01/22 2347 06/02/22 0219 06/02/22 0235 06/02/22 0434  BP: (!) 177/54 137/73 (!) 157/62 (!) 134/57  Pulse:  67 62   Resp:  (!) 22 20 20   Temp:  97.8 F (36.6 C) 97.9 F (36.6 C) 97.9 F (36.6 C)  TempSrc:  Oral Oral Oral  SpO2:  97% 99% 95%  Weight:   93.5 kg   Height:   5\' 4"  (1.626 m)     Intake/Output Summary (Last 24 hours) at 06/02/2022 0723 Last data filed at 06/02/2022 06/04/2022 Gross per 24 hour  Intake 297.22 ml  Output --  Net 297.22 ml   Filed Weights   06/01/22 1150 06/02/22 0235  Weight: 94.3 kg 93.5 kg    Physical Exam    GEN- The patient is well appearing, alert and oriented x 3 today.   Head- normocephalic, atraumatic Eyes-  Sclera clear, conjunctiva pink Ears- hearing intact Oropharynx- clear Neck- supple Lungs- Clear to ausculation bilaterally, normal work of breathing Heart- Regular rate and rhythm, no murmurs, rubs or gallops GI-  soft, NT, ND, + BS Extremities- no clubbing or cyanosis. No edema Skin- no rash or lesion Psych- euthymic mood, full affect Neuro- strength and sensation are intact  Labs    CBC Recent Labs    06/01/22 1159  WBC 9.8  NEUTROABS 5.9  HGB 11.6*  HCT 37.5  MCV 86.8  PLT 274   Basic Metabolic Panel Recent Labs    06/04/22 1159 06/02/22 0251  NA 139 138  K 4.5 4.3  CL 107 104  CO2 20* 23  GLUCOSE 141* 127*  BUN 19 18  CREATININE 1.22* 1.17*  CALCIUM 9.3 9.5  MG  --  1.7   Liver Function Tests Recent Labs    06/01/22 1159  AST 18  ALT 15  ALKPHOS 49  BILITOT 0.5  PROT 8.3*  ALBUMIN 4.0   No results for input(s): "LIPASE", "AMYLASE" in the last 72 hours. Cardiac Enzymes No results for input(s): "CKTOTAL", "CKMB", "CKMBINDEX", "TROPONINI" in the last 72 hours.   Telemetry    Flutter underlying with V pacing 60s (personally reviewed)  Radiology    ECHOCARDIOGRAM COMPLETE  Result Date: 06/01/2022    ECHOCARDIOGRAM REPORT   Patient Name:   Claire Mcgrath Date of Exam: 06/01/2022 Medical Rec #:  Larey Seat      Height:  64.0 in Accession #:    [id number]     Weight:       208.0 lb Date of Birth:  10-14-1946     BSA:          1.989 m Patient Age:    76 years       BP:           135/57 mmHg Patient Gender: F              HR:           67 bpm. Exam Location:  Inpatient Procedure: 2D Echo, Cardiac Doppler and Color Doppler Indications:    Ventricular tachycardia  History:        Patient has prior history of Echocardiogram examinations, most                 recent 08/29/2021. CHF, Pacemaker, COPD, Arrythmias:Atrial                 Fibrillation and Ventricular Fibrillation,                 Signs/Symptoms:Shortness of Breath; Risk Factors:Diabetes,                 Hypertension, Dyslipidemia and Current Smoker. History of heart                 block and CVA.  Sonographer:    Ross Ludwig RDCS (AE) Referring Phys: [id number] Mariam Dollar [facility] IMPRESSIONS  1. Left ventricular  ejection fraction, by estimation, is 60 to 65%. The left ventricle has normal function. The left ventricle has no regional wall motion abnormalities. There is mild concentric left ventricular hypertrophy. Left ventricular diastolic parameters are indeterminate.  2. Right ventricular systolic function is normal. The right ventricular size is normal. There is normal pulmonary artery systolic pressure.  3. The mitral valve is normal in structure. Mild mitral valve regurgitation. No evidence of mitral stenosis.  4. The aortic valve is normal in structure. Aortic valve regurgitation is not visualized. Aortic valve sclerosis/calcification is present, without any evidence of aortic stenosis.  5. The inferior vena cava is normal in size with greater than 50% respiratory variability, suggesting right atrial pressure of 3 mmHg. FINDINGS  Left Ventricle: Left ventricular ejection fraction, by estimation, is 60 to 65%. The left ventricle has normal function. The left ventricle has no regional wall motion abnormalities. The left ventricular internal cavity size was normal in size. There is  mild concentric left ventricular hypertrophy. Left ventricular diastolic parameters are indeterminate. Right Ventricle: The right ventricular size is normal. No increase in right ventricular wall thickness. Right ventricular systolic function is normal. There is normal pulmonary artery systolic pressure. The tricuspid regurgitant velocity is 2.14 m/s, and  with an assumed right atrial pressure of 8 mmHg, the estimated right ventricular systolic pressure is 26.3 mmHg. Left Atrium: Left atrial size was normal in size. Right Atrium: Right atrial size was normal in size. Pericardium: There is no evidence of pericardial effusion. Presence of epicardial fat layer. Mitral Valve: The mitral valve is normal in structure. Mild mitral valve regurgitation. No evidence of mitral valve stenosis. Tricuspid Valve: The tricuspid valve is normal in structure.  Tricuspid valve regurgitation is not demonstrated. No evidence of tricuspid stenosis. Aortic Valve: The aortic valve is normal in structure. Aortic valve regurgitation is not visualized. Aortic valve sclerosis/calcification is present, without any evidence of aortic stenosis. Aortic valve mean gradient measures 3.0 mmHg. Aortic valve peak  gradient  measures 4.9 mmHg. Aortic valve area, by VTI measures 1.75 cm. Pulmonic Valve: The pulmonic valve was normal in structure. Pulmonic valve regurgitation is not visualized. No evidence of pulmonic stenosis. Aorta: The aortic root is normal in size and structure. Venous: The inferior vena cava is normal in size with greater than 50% respiratory variability, suggesting right atrial pressure of 3 mmHg. IAS/Shunts: No atrial level shunt detected by color flow Doppler. Additional Comments: A device lead is visualized.  LEFT VENTRICLE PLAX 2D LVIDd:         4.80 cm   Diastology LVIDs:         2.90 cm   LV e' medial:    7.62 cm/s LV PW:         1.00 cm   LV E/e' medial:  13.3 LV IVS:        1.10 cm   LV e' lateral:   10.40 cm/s LVOT diam:     2.10 cm   LV E/e' lateral: 9.7 LV SV:         46 LV SV Index:   23 LVOT Area:     3.46 cm  RIGHT VENTRICLE             IVC RV Basal diam:  3.50 cm     IVC diam: 2.10 cm RV Mid diam:    3.70 cm RV S prime:     11.70 cm/s TAPSE (M-mode): 1.8 cm LEFT ATRIUM             Index        RIGHT ATRIUM           Index LA diam:        3.60 cm 1.81 cm/m   RA Area:     16.80 cm LA Vol (A2C):   52.3 ml 26.29 ml/m  RA Volume:   41.90 ml  21.06 ml/m LA Vol (A4C):   48.4 ml 24.33 ml/m LA Biplane Vol: 53.1 ml 26.69 ml/m  AORTIC VALVE AV Area (Vmax):    1.87 cm AV Area (Vmean):   1.78 cm AV Area (VTI):     1.75 cm AV Vmax:           111.00 cm/s AV Vmean:          74.900 cm/s AV VTI:            0.265 m AV Peak Grad:      4.9 mmHg AV Mean Grad:      3.0 mmHg LVOT Vmax:         59.90 cm/s LVOT Vmean:        38.600 cm/s LVOT VTI:          0.134 m LVOT/AV  VTI ratio: 0.51  AORTA Ao Root diam: 3.20 cm Ao Asc diam:  3.50 cm MITRAL VALVE                TRICUSPID VALVE MV Area (PHT): 7.90 cm     TR Peak grad:   18.3 mmHg MV Decel Time: 96 msec      TR Vmax:        214.00 cm/s MV E velocity: 101.00 cm/s MV A velocity: 35.30 cm/s   SHUNTS MV E/A ratio:  2.86         Systemic VTI:  0.13 m  Systemic Diam: 2.10 cm Godfrey Pick Tobb DO Electronically signed by Berniece Salines DO Signature Date/Time: 06/01/2022/5:39:37 PM    Final    DG Chest 2 View  Result Date: 06/01/2022 CLINICAL DATA:  shob EXAM: CHEST - 2 VIEW COMPARISON:  April 11, 2022 FINDINGS: Again seen is the left subclavian dual lead pacemaker, stable. The heart size and mediastinal contours are within normal limits. Both lungs are clear. The visualized skeletal structures are unremarkable. IMPRESSION: No active cardiopulmonary disease. Electronically Signed   By: Frazier Richards M.D.   On: 06/01/2022 12:25    Patient Profile     Jamica Kuwahara Dulski is a 76 y.o. female with a history of CHF, heart block, CVA, HTN, HLD, DM2, and asthma who was seen in the office today to establish for PPM management, and was sent to the ED with identification of VT/VF on her pacer interrogation.   Assessment & Plan    Recurrent PMVT Timing of change to ICD pending cath and venogram. If SVC not patent, would likely require extraction next week.  Explained risks, benefits, and alternatives to system revision/ ICD implantation, including but not limited to bleeding, infection, pneumothorax, pericardial effusion, lead dislodgement, heart attack, stroke, or death.  Pt verbalized understanding and agrees to proceed pending course.   2. Dyspnea Echo on admission shows EF 60-65%, CVP ~8, no significant valvular disease  3. CHB Normal functioning device   4. H/o CVA Eliquis on hold for procedures, last dose evening 6/14.   For questions or updates, please contact [place] Please consult [email or web address]  for contact info under Cardiology/STEMI.  Signed, Shirley Friar, PA-C  06/02/2022, 7:23 AM   EP Attending  Patient seen and examined. She is doing well. Exam reveals a RRR and clear lungs and non-focal neuro exam and minimal edema. She has undergone left heart cath which demonstrates no obstructive CAD. Her Left SCV is patent. I have reviewed again the indications for upgrade from her DDD PM to ICD and she wishes to proceed. She will restart her eliquis in several days.   Royston Sinner Jeison Delpilar,MD

## 2022-06-01 NOTE — H&P (Addendum)
ELECTROPHYSIOLOGY CONSULT NOTE    Patient ID: Claire Mcgrath MRN: [id number], DOB/AGE: [birth year] 77 y.o.  Admit date: 06/01/2022 Date of Consult: 06/01/2022  Primary Physician: Jilda Panda, MD Primary Cardiologist: None  Electrophysiologist:  Dr. Quentin Ore  Reason for admission: VT/VF identified on pacer  Patient Profile: Claire Mcgrath is a 76 y.o. female with a history of CHF, heart block, CVA, HTN, HLD, DM2, and asthma who was seen in the office today to establish for PPM management, and was sent to the ED with identification of VT/VF on her pacer interrogation.   HPI:  [name] is a 76 y.o. female with medical history as above.   On 04/11/2022 she was seen by Dr. Mellody Drown. She reported mild DOE and LE edema for about a week. She had a fall in her garden 1 week prior with a right leg injury. She was referred to EP as previous device interrogation of her pacemaker at the [facility] clinic showed runs of VT/VF.   Today, she reported feeling very short winded for the past month. Even walking between rooms at home will cause her to become short of breath. However, in the past couple of days she has seen some improvement. Of note, about 1-2 weeks ago she finished a medication course for her pneumonia.   She also reports weight gain of about 16 lbs. She does not believe she is retaining fluid.   Occasionally she will feel a little "woozy," which she would attribute to her diabetes. Previously she has felt near syncopal at times, most recently during her stress test in 03/2022.   In her family, her mother and father both had an enlarged heart.   She denies any palpitations, chest pain, or peripheral edema. No , headaches, orthopnea, or PND. She is feeling OK currently. She denies any history of syncope. Prior "woozy" spells, she has blamed on hypoglycemia.   Past Medical History:  Diagnosis Date   Asthma    CHF (congestive heart failure) ([place])    Diabetes mellitus without  complication ([place])    Heart block    Hyperlipidemia    Hypertension    Stroke [facility])      Surgical History:  Past Surgical History:  Procedure Laterality Date   ABDOMINAL HYSTERECTOMY     BREAST LUMPECTOMY     left   EP IMPLANTABLE DEVICE     PACEMAKER INSERTION       (Not in a hospital admission)   Inpatient Medications:   Allergies:  Allergies  Allergen Reactions   Kiwi Extract Anaphylaxis    Feel my throat close up   Iodine Itching and Swelling        Shrimp [Shellfish Allergy] Itching and Swelling    Social History   Socioeconomic History   Marital status: Married    Spouse name: Not on file   Number of children: Not on file   Years of education: Not on file   Highest education level: Not on file  Occupational History   Not on file  Tobacco Use   Smoking status: Every Day    Packs/day: 0.50    Types: Cigarettes   Smokeless tobacco: Never  Substance and Sexual Activity   Alcohol use: Yes    Comment: occassional   Drug use: No   Sexual activity: Not on file  Other Topics Concern   Not on file  Social History Narrative   Not on file   Social Determinants of Health   Financial Resource  Strain: Not on file  Food Insecurity: Not on file  Transportation Needs: Not on file  Physical Activity: Not on file  Stress: Not on file  Social Connections: Not on file  Intimate Partner Violence: Not on file     Family History  Problem Relation Age of Onset   Congestive Heart Failure Mother    Heart attack Father    Stroke Maternal Aunt      Review of Systems: All other systems reviewed and are otherwise negative except as noted above.  Physical Exam: Vitals:   06/01/22 1149 06/01/22 1150  BP: (!) 158/57   Pulse: 68   Resp: 16   Temp: 98.3 F (36.8 C)   TempSrc: Oral   SpO2: 96%   Weight:  94.3 kg  Height:  5\' 4"  (1.626 m)    GEN- The patient is well appearing, alert and oriented x 3 today.   HEENT: normocephalic, atraumatic; sclera clear,  conjunctiva pink; hearing intact; oropharynx clear; neck supple Lungs- Clear to ausculation bilaterally, normal work of breathing.  No wheezes, rales, rhonchi Heart- Regular rate and rhythm, no murmurs, rubs or gallops GI- soft, non-tender, non-distended, bowel sounds present Extremities- no clubbing, cyanosis, or edema; DP/PT/radial pulses 2+ bilaterally MS- no significant deformity or atrophy Skin- warm and dry, no rash or lesion Psych- euthymic mood, full affect Neuro- strength and sensation are intact  Labs:   Lab Results  Component Value Date   WBC 8.6 08/30/2021   HGB 11.6 (L) 08/30/2021   HCT 37.7 08/30/2021   MCV 88.5 08/30/2021   PLT 263 08/30/2021   No results for input(s): "NA", "K", "CL", "CO2", "BUN", "CREATININE", "CALCIUM", "PROT", "BILITOT", "ALKPHOS", "ALT", "AST", "GLUCOSE" in the last 168 hours.  Invalid input(s): "LABALBU"    Radiology/Studies: No results found.  09/01/2021 flutter with controlled VT at 68 bpm (personally reviewed)  TELEMETRY: not yet connected, long history of rate controlled flutter (personally reviewed)  DEVICE HISTORY: MDT DDD PPM initially implanted 2008, gen change 2016  Assessment/Plan: 1.  VT/VF Identified on PPM interrogation Will update Echo today given co morbidities.  Will plan LHC tomorrow and pending results likely upgrade to ICD next week.  Keep K > 4.0 and Mg > 2.0  Reviewed with Dr. 2017, and given age of her device, would likely plan to abandon her old device and place new lead(s).  2. CHB [employer] DDD PPM otherwise functioning normally  3. Chronic diastolic CHF Echo 08/2021 with normal EF Will update echo in setting of VT  4. HTN Follow per-procedurally  5. Recent PNA Has finished ABx  For questions or updates, please contact [facility] Please consult [email or web address] for contact info under Cardiology/STEMI.  09/2021, PA-C  06/01/2022 11:50 AM   EP Attending  Patient seen  and examined. Agree with above. The patient presents today for evaluation after being found to have recurrent VF/PMVT. She was sleeping during the longest of these episodes but also had shorter episodes during the daytime. She notes that she has been more sob but denies anginal symptoms. No edema. No chest pain. On exam she is a pleasant well appearing woman, NAD. Lungs are clear. CV reveals a RRR and extremities demonstrate trace edema. Neuro is non-focal. Tele with Atrial fib. Review of her PPM interrogation demonstates nocturnal runs of PMVT.  A/P Recurrent PMVT - we will undergo left heart cath. If she has developed obstructive CAD, she will need revascularization. If none, then upgrade to a single chamber ICD  would be recommended.  Dyspnea - etiology is unclear. We will obtain a 2D echo. Left heart cath is also pending. CHB - she is s/p PPM and her device is working normally. We will plan an upgrade of her device to an ICD.  Sharlot Gowda Oluwaferanmi Wain,MD

## 2022-06-01 NOTE — Progress Notes (Signed)
Echo attempted at 3:15, patient not in room at this time.  [facility] Sherea Liptak RDCS

## 2022-06-01 NOTE — ED Provider Triage Note (Signed)
Emergency Medicine Provider Triage Evaluation Note  Claire Mcgrath , a 76 y.o. female  was evaluated in triage.  Pt complains of V. tach/V-fib.  Patient has pacemaker.  Interrogation performed by PCP shows runs of V. tach/V-fib.  Patient went to see cardiologist Dr. Lalla Brothers today who sent her to the emergency department for further work-up.  Patient reports that she was told that she needs to have a defibrillator placed.  Patient reports that she has been having shortness of breath over the last few weeks.  Shortness of breath has improved after receiving treatment for pneumonia.  Patient does report that shortness of breath is worse with exertion.  Patient denies any lightheadedness, syncope, dizziness, chest pain, palpitations.  Review of Systems  Positive: See above Negative: See above  Physical Exam  BP (!) 158/57 (BP Location: Right Arm)   Pulse 68   Temp 98.3 F (36.8 C) (Oral)   Resp 16   Ht 5\' 4"  (1.626 m)   Wt 94.3 kg   SpO2 96%   BMI 35.70 kg/m  Gen:   Awake, no distress   Resp:  Normal effort, clear to auscultation bilaterally.  Speaks in full complete sentences without difficulty. MSK:   Moves extremities without difficulty; no swelling or tenderness to bilateral lower extremities Other:    Medical Decision Making  Medically screening exam initiated at 11:55 AM.  Appropriate orders placed.  Claire Mcgrath was informed that the remainder of the evaluation will be completed by another provider, this initial triage assessment does not replace that evaluation, and the importance of remaining in the ED until their evaluation is complete.     Claire Mcgrath, Claire Mcgrath 06/01/22 1157

## 2022-06-01 NOTE — Patient Instructions (Addendum)
Medication Instructions:  Your physician recommends that you continue on your current medications as directed. Please refer to the Current Medication list given to you today. *If you need a refill on your cardiac medications before your next appointment, please call your pharmacy*  Lab Work: None. If you have labs (blood work) drawn today and your tests are completely normal, you will receive your results only by: MyChart Message (if you have MyChart) OR A paper copy in the mail If you have any lab test that is abnormal or we need to change your treatment, we will call you to review the results.  Testing/Procedures: None.  Follow-Up: At [facility], you and your health needs are our priority.  As part of our continuing mission to provide you with exceptional heart care, we have created designated Provider Care Teams.  These Care Teams include your primary Cardiologist (physician) and Advanced Practice Providers (APPs -  Physician Assistants and Nurse Practitioners) who all work together to provide you with the care you need, when you need it.  Your physician wants you to follow-up in: Go to hospital.   We recommend signing up for the patient portal called "MyChart".  Sign up information is provided on this After Visit Summary.  MyChart is used to connect with patients for Virtual Visits (Telemedicine).  Patients are able to view lab/test results, encounter notes, upcoming appointments, etc.  Non-urgent messages can be sent to your provider as well.   To learn more about what you can do with MyChart, go to [email or web address].    Any Other Special Instructions Will Be Listed Below (If Applicable).

## 2022-06-01 NOTE — Progress Notes (Signed)
  Echocardiogram 2D Echocardiogram has been performed.  Gerda Diss 06/01/2022, 4:44 PM

## 2022-06-01 NOTE — Progress Notes (Signed)
Pharmacy Consult for heparin gtt  Indication: atrial fibrillation  Allergies  Allergen Reactions   Kiwi Extract Anaphylaxis    Feel my throat close up   Iodine Itching and Swelling        Shrimp [Shellfish Allergy] Itching and Swelling    Patient Measurements: Height: 5\' 4"  (162.6 cm) Weight: 94.3 kg (208 lb) IBW/kg (Calculated) : 54.7  HEPARIN DW (KG): 76.2  Vital Signs: Temp: 97.8 F (36.6 C) (06/15 1357) Temp Source: Oral (06/15 1357) BP: 135/57 (06/15 1357) Pulse Rate: 60 (06/15 1357)  Labs: Recent Labs    06/01/22 1159  HGB 11.6*  HCT 37.5  PLT 274  CREATININE 1.22*    Estimated Creatinine Clearance: 43.7 mL/min (A) (by C-G formula based on SCr of 1.22 mg/dL (H)).  Assessment: Claire Mcgrath a 76 y.o. female presented after pacer showing runs of VT/VF in cardiologist office 6/15 AM.. Pharmacy has been consulted for heparin dosing.   Anticoagulation PTA: Patient taking apixaban (Eliquis) 5 mg BID PTA for pAF. This will require aPTT/HL correlation before transitioning to only heparin level monitoring.   Goal of Therapy:  Heparin level 0.3-0.7 units/ml Monitor platelets by anticoagulation protocol: Yes   Plan:  Start heparin infusion at 950 units/hr (no bolus; stop 0000 6/16 per consult)  Ordered baseline heparin level since pt on eliquis PTA, but no other levels based on stop time  Monitor for signs and symptoms of bleeding    7/16, PharmD PGY-1 Acute Care Resident  06/01/2022 4:47 PM

## 2022-06-01 NOTE — Progress Notes (Signed)
Electrophysiology Office Note:    Date:  06/01/2022   ID:  Claire Mcgrath, DOB February 13, 1946, MRN [id number]  PCP:  Jilda Panda, MD  [facility] Cardiologist:  None  [facility] Electrophysiologist:  Vickie Epley, MD   Referring MD: Clabe Seal, PA-C   Chief Complaint: New patient consult for PPM, runs of VT/VF  History of Present Illness:    Claire Mcgrath is a 76 y.o. female who presents for an evaluation of VT/VF at the request of Dr. Mellody Drown. Their medical history includes congestive heart failure, heart block, stroke, hypertension, hyperlipidemia, diabetes, and asthma.   On 04/11/2022 she was seen by Dr. Mellody Drown. She reported mild DOE and LE edema for about a week. She had a fall in her garden 1 week prior with a right leg injury. She was referred to EP as previous device interrogation of her pacemaker at the [facility] clinic showed runs of VT/VF.  Today, she reports feeling very short winded for the past month. Even walking between rooms at home will cause her to become short of breath. However, in the past couple of days she has seen some improvement. Of note, about 1-2 weeks ago she finished her medication course for her pneumonia.  She also reports weight gain of about 16 lbs. She does not believe she is retaining fluid.  Occasionally she will feel a little "woozy," which she would attribute to her diabetes.  Previously she has felt near syncopal at times, most recently during her stress test in 03/2022.  In her family, her mother and father both had an enlarged heart.  She denies any palpitations, chest pain, or peripheral edema. No , headaches, orthopnea, or PND.     Past Medical History:  Diagnosis Date   Asthma    CHF (congestive heart failure) (HCC)    Diabetes mellitus without complication ([place])    Heart block    Hyperlipidemia    Hypertension    Stroke [facility])     Past Surgical History:  Procedure Laterality Date   ABDOMINAL HYSTERECTOMY      BREAST LUMPECTOMY     left   EP IMPLANTABLE DEVICE     PACEMAKER INSERTION      Current Medications: Current Meds  Medication Sig   amLODipine (NORVASC) 2.5 MG tablet Take 2.5 mg by mouth daily.   ELIQUIS 5 MG TABS tablet Take 5 mg by mouth 2 (two) times daily.    fluticasone furoate-vilanterol (BREO ELLIPTA) 100-25 MCG/INH AEPB Inhale 1 puff into the lungs daily as needed (shortness of breath, wheezing).   Insulin Lispro Prot & Lispro (HUMALOG 75/25 MIX) (75-25) 100 UNIT/ML Kwikpen Inject 20 Units into the skin 2 (two) times daily. Take 20 units in the am and 10 in the pm   lisinopril-hydrochlorothiazide (PRINZIDE,ZESTORETIC) 10-12.5 MG tablet Take 1 tablet by mouth at bedtime.   metFORMIN (GLUCOPHAGE) 1000 MG tablet Take 1,000 mg by mouth at bedtime.   metoprolol succinate (TOPROL-XL) 25 MG 24 hr tablet Take 25 mg by mouth at bedtime.   pravastatin (PRAVACHOL) 20 MG tablet Take 10 mg by mouth at bedtime.     Allergies:   Kiwi extract, Iodine, and Shrimp [shellfish allergy]   Social History   Socioeconomic History   Marital status: Married    Spouse name: Not on file   Number of children: Not on file   Years of education: Not on file   Highest education level: Not on file  Occupational History   Not on  file  Tobacco Use   Smoking status: Every Day    Packs/day: 0.50    Types: Cigarettes   Smokeless tobacco: Never  Substance and Sexual Activity   Alcohol use: Yes    Comment: occassional   Drug use: No   Sexual activity: Not on file  Other Topics Concern   Not on file  Social History Narrative   Not on file   Social Determinants of Health   Financial Resource Strain: Not on file  Food Insecurity: Not on file  Transportation Needs: Not on file  Physical Activity: Not on file  Stress: Not on file  Social Connections: Not on file     Family History: The patient's family history includes Congestive Heart Failure in her mother; Heart attack in her father; Stroke in  her maternal aunt.  ROS:   Please see the history of present illness.    (+) Shortness of breath (+) Lightheadedness All other systems reviewed and are negative.  EKGs/Labs/Other Studies Reviewed:    The following studies were reviewed today:  03/27/2022  Nuclear Myoview ([facility]): 1.  Normal left ventricular function  2.  Normal wall motion  3.  No evidence for scar or ischemia  08/29/2021  Echo:  1. Left ventricular ejection fraction, by estimation, is 60 to 65%. The  left ventricle has normal function. The left ventricle has no regional  wall motion abnormalities. There is moderate left ventricular hypertrophy.  Left ventricular diastolic function   could not be evaluated.   2. Right ventricular systolic function is normal. The right ventricular  size is normal. There is normal pulmonary artery systolic pressure. The  estimated right ventricular systolic pressure is 23.4 mmHg.   3. The mitral valve is abnormal. Trivial mitral valve regurgitation.   4. The aortic valve is tricuspid. Aortic valve regurgitation is not  visualized. Mild aortic valve sclerosis is present, with no evidence of  aortic valve stenosis. Aortic valve mean gradient measures 3.0 mmHg.   5. The inferior vena cava is dilated in size with >50% respiratory  variability, suggesting right atrial pressure of 8 mmHg.   08/28/2021  CTA Head/Neck: FINDINGS: CTA NECK FINDINGS   Aortic arch: Atherosclerosis of the aorta. Great vessel origins are patent.   Right carotid system: Ulcerated atherosclerosis at the carotid bifurcation without greater than 50% stenosis, similar to prior.   Left carotid system: Ulcerated atherosclerosis at the carotid bifurcation without greater than 50% stenosis, similar to prior.   Vertebral arteries: Mild proximal vertebral arteries narrowing bilaterally, similar to prior. No significant (greater than 50%) stenosis. Codominant.   Skeleton: No acute osseous abnormality identified.  Degenerative change in the visualized thoracic spine.   Other neck: Enlarged, heterogeneous thyroid with multiple nodules, measuring up to 2 cm anteriorly.   Upper chest: Paraseptal emphysema. No consolidation in the visualized lung apices.   Review of the MIP images confirms the above findings   CTA HEAD FINDINGS   Anterior circulation: Bilateral intracranial ICAs are patent with similar up to moderate stenosis bilaterally due to calcific atherosclerosis. Bilateral MCAs and ACAs are patent without proximal hemodynamically significant stenosis. No aneurysm identified.   Posterior circulation: Save draft bilateral intradural vertebral arteries, basilar artery, and posterior cerebral arteries are patent. Similar moderate left P2 PCA stenosis. Bilateral PICAs are patent. No aneurysm identified.   Venous sinuses: As permitted by contrast timing, patent.   Anatomic variants: None.   Review of the MIP images confirms the above findings   IMPRESSION: 1. No  large vessel occlusion. 2. Similar moderate left P2 PCA stenosis. 3. Similar moderate bilateral intracranial ICA stenosis. 4. Similar bilateral carotid bifurcation atherosclerosis without greater than 50% stenosis. 5. Similar mild proximal vertebral artery stenosis bilaterally. 6. Enlarged and heterogeneous thyroid with multiple nodules, measuring up to 2 cm anteriorly. Recommend thyroid US (Ref: J Am Coll Radiol. 2015 Feb;12(2): 143-50). 7. Aortic Atherosclerosis (ICD10-I70.0) and Emphysema (ICD10-J43.9).  EKG:   EKG is personally reviewed.  06/01/2022: Ventricular pacing with frequent PVCs   June 01, 2022 device interrogation personally reviewed Presented VVI R 60-1 30 Battery longevity 3.5 years Lead parameters stable Ventricular pacing 97.9% Underlying ventricular rhythm in the 30s Several ventricular high rate episodes  -January 2021, May 2021, March 2022, October 2022 and February 2023.  Longest episode 16 seconds  in October 2022 with a ventricular rate of 320 bpm.  Review of that EGM confirms ventricular fibrillation.  Onset of VF seems to be a PVC that comes in about 120 ms early on a 980 ms underlying paced rhythm      Recent Labs: 06/01/2022: ALT 15; B Natriuretic Peptide 106.8; BUN 19; Creatinine, Ser 1.22; Hemoglobin 11.6; Platelets 274; Potassium 4.5; Sodium 139   Recent Lipid Panel    Component Value Date/Time   CHOL 122 08/29/2021 0817   TRIG 56 08/29/2021 0817   HDL 46 08/29/2021 0817   CHOLHDL 2.7 08/29/2021 0817   VLDL 11 08/29/2021 0817   LDLCALC 65 08/29/2021 0817    Physical Exam:    VS:  BP (!) 164/68   Pulse 81   Ht 5\' 4"  (1.626 m)   Wt 208 lb 3.2 oz (94.4 kg)   SpO2 98%   BMI 35.74 kg/m     Wt Readings from Last 3 Encounters:  06/01/22 208 lb (94.3 kg)  06/01/22 208 lb 3.2 oz (94.4 kg)  08/29/21 198 lb 6.6 oz (90 kg)     GEN: Well nourished, well developed in no acute distress.  Elderly HEENT: Normal NECK: No JVD; No carotid bruits LYMPHATICS: No lymphadenopathy CARDIAC: RRR, no murmurs, rubs, gallops RESPIRATORY:  Clear to auscultation without rales, wheezing or rhonchi  ABDOMEN: Soft, non-tender, non-distended MUSCULOSKELETAL:  No edema; No deformity  SKIN: Warm and dry NEUROLOGIC:  Alert and oriented x 3 PSYCHIATRIC:  Normal affect       ASSESSMENT:    1. Cerebrovascular accident (CVA), unspecified mechanism (HCC)   2. Heart block AV complete (HCC)   3. Cardiac pacemaker in situ   4. Primary hypertension    PLAN:    In order of problems listed above:  #Complete heart block #Permanent pacemaker in situ Patient has a pacemaker for complete heart block that is working appropriately.  The patient has had several ventricular high rate episodes with EGM's confirming ventricular fibrillation.  Thankfully she has not had a syncopal episode with these episodes mostly occurring in the nighttime hours.  Despite them being asymptomatic, they do require  prompt evaluation.  I had a long discussion with the patient today about her ventricular fibrillation and necessary work-up.  She will need a left heart catheterization.  We will plan for this to happen tomorrow.  I have referred her to the emergency department.  Someone else will drive her given the nature of the arrhythmia.  #Ventricular fibrillation Detected on pacemaker evaluation.  Left heart catheterization as above.  The patient will require an upgrade to a defibrillator.  We will need a venogram of the left upper extremity.  If the  vein is patent, plan to cap the old pacemaker lead and implant a new RV ICD lead.  If the left subclavian/axillary vein are occluded, patient will require extraction and reimplant.  These possibilities were discussed in detail with the patient during today's visit including  I discussed this with the EP team at the hospital.   Total time spent with patient today 80 minutes. This includes reviewing records, evaluating the patient and coordinating care.  Medication Adjustments/Labs and Tests Ordered: Current medicines are reviewed at length with the patient today.  Concerns regarding medicines are outlined above.  No orders of the defined types were placed in this encounter.  No orders of the defined types were placed in this encounter.   I,Mathew Stumpf,acting as a Education administrator for Vickie Epley, MD.,have documented all relevant documentation on the behalf of Vickie Epley, MD,as directed by  Vickie Epley, MD while in the presence of Vickie Epley, MD.  I, Vickie Epley, MD, have reviewed all documentation for this visit. The documentation on 06/01/22 for the exam, diagnosis, procedures, and orders are all accurate and complete.   Signed, Hilton Cork. Quentin Ore, MD, [facility], [facility] 06/01/2022 10:12 PM    Electrophysiology [facility]

## 2022-06-02 ENCOUNTER — Encounter (HOSPITAL_COMMUNITY): Payer: Self-pay | Admitting: Cardiovascular Disease

## 2022-06-02 ENCOUNTER — Inpatient Hospital Stay (HOSPITAL_COMMUNITY)
Admission: EM | Disposition: A | Discharge status: Home / Self Care | Payer: Self-pay | Source: Home / Self Care | Attending: Internal Medicine

## 2022-06-02 ENCOUNTER — Encounter (HOSPITAL_COMMUNITY)
Admission: EM | Disposition: A | Discharge status: Home / Self Care | Payer: Self-pay | Source: Home / Self Care | Attending: Internal Medicine

## 2022-06-02 DIAGNOSIS — I4901 Ventricular fibrillation: Secondary | ICD-10-CM | POA: Diagnosis not present

## 2022-06-02 DIAGNOSIS — I472 Ventricular tachycardia, unspecified: Secondary | ICD-10-CM | POA: Diagnosis not present

## 2022-06-02 HISTORY — PX: LEFT HEART CATH AND CORONARY ANGIOGRAPHY: CATH118249

## 2022-06-02 HISTORY — PX: PPM GENERATOR REMOVAL: EP1234

## 2022-06-02 HISTORY — PX: ICD IMPLANT: EP1208

## 2022-06-02 LAB — MAGNESIUM: Magnesium: 1.7 mg/dL (ref 1.7–2.4)

## 2022-06-02 LAB — BASIC METABOLIC PANEL
Anion gap: 11 (ref 5–15)
BUN: 18 mg/dL (ref 8–23)
CO2: 23 mmol/L (ref 22–32)
Calcium: 9.5 mg/dL (ref 8.9–10.3)
Chloride: 104 mmol/L (ref 98–111)
Creatinine, Ser: 1.17 mg/dL — ABNORMAL HIGH (ref 0.44–1.00)
GFR, Estimated: 48 mL/min — ABNORMAL LOW (ref >60)
Glucose, Bld: 127 mg/dL — ABNORMAL HIGH (ref 70–99)
Potassium: 4.3 mmol/L (ref 3.5–5.1)
Sodium: 138 mmol/L (ref 135–145)

## 2022-06-02 LAB — GLUCOSE, CAPILLARY: Glucose-Capillary: 137 mg/dL — ABNORMAL HIGH (ref 70–99)

## 2022-06-02 LAB — HEPARIN LEVEL (UNFRACTIONATED): Heparin Unfractionated: 1.09 IU/mL — ABNORMAL HIGH (ref 0.30–0.70)

## 2022-06-02 SURGERY — ICD IMPLANT

## 2022-06-02 SURGERY — LEFT HEART CATH AND CORONARY ANGIOGRAPHY
Anesthesia: LOCAL

## 2022-06-02 MED ORDER — SODIUM CHLORIDE 0.9 % IV SOLN
INTRAVENOUS | Status: AC
  Filled 2022-06-02: qty 2

## 2022-06-02 MED ORDER — LIDOCAINE HCL (PF) 1 % IJ SOLN
INTRAMUSCULAR | Status: AC
  Filled 2022-06-02: qty 30

## 2022-06-02 MED ORDER — SODIUM CHLORIDE 0.9% FLUSH
3.0000 mL | Freq: Two times a day (BID) | INTRAVENOUS | Status: DC
Start: 2022-06-02 — End: 2022-06-03

## 2022-06-02 MED ORDER — CEFAZOLIN SODIUM-DEXTROSE 1-4 GM/50ML-% IV SOLN
1.0000 g | Freq: Three times a day (TID) | INTRAVENOUS | Status: AC
  Administered 2022-06-02 – 2022-06-03 (×2): 1 g via INTRAVENOUS
  Filled 2022-06-02 (×3): qty 50

## 2022-06-02 MED ORDER — IODIXANOL 320 MG/ML IV SOLN
INTRAVENOUS | Status: DC | PRN
  Administered 2022-06-02: 10 mL via INTRAVENOUS

## 2022-06-02 MED ORDER — MIDAZOLAM HCL 5 MG/5ML IJ SOLN
INTRAMUSCULAR | Status: AC
  Filled 2022-06-02: qty 5

## 2022-06-02 MED ORDER — LIDOCAINE HCL (PF) 1 % IJ SOLN
INTRAMUSCULAR | Status: DC | PRN
  Administered 2022-06-02: 90 mL
  Administered 2022-06-02: 8 mL

## 2022-06-02 MED ORDER — ACETAMINOPHEN 325 MG PO TABS: 325.0000 mg | ORAL_TABLET | ORAL | Status: DC | PRN

## 2022-06-02 MED ORDER — CEFAZOLIN SODIUM-DEXTROSE 1-4 GM/50ML-% IV SOLN
1.0000 g | Freq: Three times a day (TID) | INTRAVENOUS | Status: DC
  Filled 2022-06-02: qty 50

## 2022-06-02 MED ORDER — SODIUM CHLORIDE 0.9 % IV SOLN
INTRAVENOUS | Status: DC | PRN
  Administered 2022-06-02: 80 mg

## 2022-06-02 MED ORDER — HEPARIN (PORCINE) IN NACL 1000-0.9 UT/500ML-% IV SOLN
INTRAVENOUS | Status: AC
  Filled 2022-06-02: qty 500

## 2022-06-02 MED ORDER — LABETALOL HCL 5 MG/ML IV SOLN
10.0000 mg | INTRAVENOUS | Status: AC | PRN
Start: 2022-06-02 — End: 2022-06-02

## 2022-06-02 MED ORDER — LIDOCAINE HCL (PF) 1 % IJ SOLN
INTRAMUSCULAR | Status: AC
Start: 2022-06-02 — End: ?
  Filled 2022-06-02: qty 30

## 2022-06-02 MED ORDER — FENTANYL CITRATE (PF) 100 MCG/2ML IJ SOLN
INTRAMUSCULAR | Status: AC
  Filled 2022-06-02: qty 2

## 2022-06-02 MED ORDER — FENTANYL CITRATE (PF) 100 MCG/2ML IJ SOLN
INTRAMUSCULAR | Status: DC | PRN
  Administered 2022-06-02 (×2): 12.5 ug
  Administered 2022-06-02: 25 ug via INTRAVENOUS
  Administered 2022-06-02: 12.5 ug

## 2022-06-02 MED ORDER — SODIUM CHLORIDE 0.9 % IV SOLN: INTRAVENOUS | Status: AC

## 2022-06-02 MED ORDER — CEFAZOLIN SODIUM-DEXTROSE 2-4 GM/100ML-% IV SOLN
INTRAVENOUS | Status: AC
  Filled 2022-06-02: qty 100

## 2022-06-02 MED ORDER — SODIUM CHLORIDE 0.9% FLUSH: 3.0000 mL | INTRAVENOUS | Status: DC | PRN

## 2022-06-02 MED ORDER — MIDAZOLAM HCL 2 MG/2ML IJ SOLN
INTRAMUSCULAR | Status: AC
  Filled 2022-06-02: qty 2

## 2022-06-02 MED ORDER — ASPIRIN 81 MG PO CHEW
81.0000 mg | CHEWABLE_TABLET | Freq: Every day | ORAL | Status: DC
  Filled 2022-06-02: qty 1

## 2022-06-02 MED ORDER — CEFAZOLIN SODIUM-DEXTROSE 1-4 GM/50ML-% IV SOLN
INTRAVENOUS | Status: DC | PRN
  Administered 2022-06-02: 2 g via INTRAVENOUS

## 2022-06-02 MED ORDER — IOHEXOL 350 MG/ML SOLN
INTRAVENOUS | Status: DC | PRN
  Administered 2022-06-02: 60 mL

## 2022-06-02 MED ORDER — ACETAMINOPHEN 325 MG PO TABS: 650.0000 mg | ORAL_TABLET | ORAL | Status: DC | PRN

## 2022-06-02 MED ORDER — SODIUM CHLORIDE 0.9 % IV SOLN: 250.0000 mL | INTRAVENOUS | Status: DC | PRN

## 2022-06-02 MED ORDER — HEPARIN SODIUM (PORCINE) 1000 UNIT/ML IJ SOLN
INTRAMUSCULAR | Status: DC | PRN
  Administered 2022-06-02: 5000 [IU] via INTRAVENOUS

## 2022-06-02 MED ORDER — ONDANSETRON HCL 4 MG/2ML IJ SOLN: 4.0000 mg | Freq: Four times a day (QID) | INTRAMUSCULAR | Status: DC | PRN

## 2022-06-02 MED ORDER — HEPARIN SODIUM (PORCINE) 1000 UNIT/ML IJ SOLN
INTRAMUSCULAR | Status: AC
  Filled 2022-06-02: qty 10

## 2022-06-02 MED ORDER — HEPARIN (PORCINE) IN NACL 1000-0.9 UT/500ML-% IV SOLN
INTRAVENOUS | Status: DC | PRN
  Administered 2022-06-02 (×3): 500 mL

## 2022-06-02 MED ORDER — MAGNESIUM SULFATE IN D5W 1-5 GM/100ML-% IV SOLN
1.0000 g | Freq: Once | INTRAVENOUS | Status: AC
Start: 2022-06-02 — End: 2022-06-02
  Administered 2022-06-02: 1 g via INTRAVENOUS
  Filled 2022-06-02: qty 100

## 2022-06-02 MED ORDER — HYDRALAZINE HCL 20 MG/ML IJ SOLN: 10.0000 mg | INTRAMUSCULAR | Status: AC | PRN

## 2022-06-02 MED ORDER — MIDAZOLAM HCL 2 MG/2ML IJ SOLN
INTRAMUSCULAR | Status: DC | PRN
  Administered 2022-06-02 (×3): 1 mg
  Administered 2022-06-02: 1 mg via INTRAVENOUS

## 2022-06-02 MED ORDER — VERAPAMIL HCL 2.5 MG/ML IV SOLN
INTRAVENOUS | Status: AC
  Filled 2022-06-02: qty 2

## 2022-06-02 MED ORDER — VERAPAMIL HCL 2.5 MG/ML IV SOLN
INTRAVENOUS | Status: DC | PRN
  Administered 2022-06-02: 10 mL via INTRA_ARTERIAL

## 2022-06-02 SURGICAL SUPPLY — 12 items
BAND CMPR LRG ZPHR (HEMOSTASIS) ×1
BAND ZEPHYR COMPRESS 30 LONG (HEMOSTASIS) ×1 IMPLANT
CATH OPTITORQUE TIG 4.0 5F (CATHETERS) ×1 IMPLANT
ELECT DEFIB PAD ADLT CADENCE (PAD) ×1 IMPLANT
GLIDESHEATH SLEND SS 6F .021 (SHEATH) ×1 IMPLANT
GUIDEWIRE INQWIRE 1.5J.035X260 (WIRE) IMPLANT
INQWIRE 1.5J .035X260CM (WIRE) ×2
KIT HEART LEFT (KITS) ×3 IMPLANT
PACK CARDIAC CATHETERIZATION (CUSTOM PROCEDURE TRAY) ×3 IMPLANT
SHEATH PROBE COVER 6X72 (BAG) ×1 IMPLANT
TRANSDUCER W/STOPCOCK (MISCELLANEOUS) ×3 IMPLANT
TUBING CIL FLEX 10 FLL-RA (TUBING) ×3 IMPLANT

## 2022-06-02 SURGICAL SUPPLY — 9 items
CABLE SURGICAL S-101-97-12 (CABLE) ×3 IMPLANT
GUIDEWIRE ANGLED .035X150CM (WIRE) IMPLANT
ICD EVERA XT MRI DF1  DDMB1D1 (ICD Generator) ×2 IMPLANT
ICD EVERA XT MRI DF1 DDMB1D1 (ICD Generator) IMPLANT
KIT MICROPUNCTURE NIT STIFF (SHEATH) ×1 IMPLANT
LEAD SPRINT QUAT SEC 6935-65CM (Lead) ×1 IMPLANT
PAD DEFIB RADIO PHYSIO CONN (PAD) ×3 IMPLANT
SHEATH 9FR PRELUDE SNAP 13 (SHEATH) ×1 IMPLANT
TRAY PACEMAKER INSERTION (PACKS) ×3 IMPLANT

## 2022-06-02 NOTE — Progress Notes (Signed)
Heart Failure Navigator Progress Note  Assessed for Heart & Vascular TOC clinic readiness.  Patient does not meet criteria due to Ef 60-65% , no acute changes. .   Navigator available for reassessment of patient.   Rhae Hammock, BSN, Scientist, clinical (histocompatibility and immunogenetics) Only

## 2022-06-02 NOTE — Interval H&P Note (Signed)
Cath Lab Visit (complete for each Cath Lab visit)  Clinical Evaluation Leading to the Procedure:   ACS: No.  Non-ACS:    Anginal Classification: CCS I  Anti-ischemic medical therapy: No Therapy  Non-Invasive Test Results: No non-invasive testing performed  Prior CABG: No previous CABG      History and Physical Interval Note:  06/02/2022 9:20 AM  Claire Mcgrath  has presented today for surgery, with the diagnosis of V TACH.  The various methods of treatment have been discussed with the patient and family. After consideration of risks, benefits and other options for treatment, the patient has consented to  Procedure(s): LEFT HEART CATH AND CORONARY ANGIOGRAPHY (N/A) as a surgical intervention.  The patient's history has been reviewed, patient examined, no change in status, stable for surgery.  I have reviewed the patient's chart and labs.  Questions were answered to the patient's satisfaction.     [name]

## 2022-06-03 ENCOUNTER — Inpatient Hospital Stay (HOSPITAL_COMMUNITY): Payer: Medicare Other

## 2022-06-03 ENCOUNTER — Other Ambulatory Visit: Payer: Self-pay | Admitting: Physician Assistant

## 2022-06-03 DIAGNOSIS — N1831 Chronic kidney disease, stage 3a: Secondary | ICD-10-CM

## 2022-06-03 DIAGNOSIS — I4901 Ventricular fibrillation: Secondary | ICD-10-CM | POA: Diagnosis not present

## 2022-06-03 DIAGNOSIS — Z4502 Encounter for adjustment and management of automatic implantable cardiac defibrillator: Secondary | ICD-10-CM

## 2022-06-03 DIAGNOSIS — I5032 Chronic diastolic (congestive) heart failure: Secondary | ICD-10-CM

## 2022-06-03 DIAGNOSIS — I472 Ventricular tachycardia, unspecified: Secondary | ICD-10-CM | POA: Diagnosis not present

## 2022-06-03 LAB — BASIC METABOLIC PANEL
Anion gap: 10 (ref 5–15)
BUN: 25 mg/dL — ABNORMAL HIGH (ref 8–23)
CO2: 21 mmol/L — ABNORMAL LOW (ref 22–32)
Calcium: 9 mg/dL (ref 8.9–10.3)
Chloride: 104 mmol/L (ref 98–111)
Creatinine, Ser: 1.54 mg/dL — ABNORMAL HIGH (ref 0.44–1.00)
GFR, Estimated: 35 mL/min — ABNORMAL LOW (ref >60)
Glucose, Bld: 159 mg/dL — ABNORMAL HIGH (ref 70–99)
Potassium: 4.4 mmol/L (ref 3.5–5.1)
Sodium: 135 mmol/L (ref 135–145)

## 2022-06-03 LAB — LIPID PANEL
Cholesterol: 122 mg/dL (ref 0–200)
HDL: 46 mg/dL (ref >40)
LDL Cholesterol: 62 mg/dL (ref 0–99)
Total CHOL/HDL Ratio: 2.7 RATIO
Triglycerides: 68 mg/dL (ref <150)
VLDL: 14 mg/dL (ref 0–40)

## 2022-06-03 LAB — HEMOGLOBIN A1C
Hgb A1c MFr Bld: 7.2 % — ABNORMAL HIGH (ref 4.8–5.6)
Mean Plasma Glucose: 159.94 mg/dL

## 2022-06-03 LAB — CBC
HCT: 35 % — ABNORMAL LOW (ref 36.0–46.0)
Hemoglobin: 11.5 g/dL — ABNORMAL LOW (ref 12.0–15.0)
MCH: 27.3 pg (ref 26.0–34.0)
MCHC: 32.9 g/dL (ref 30.0–36.0)
MCV: 83.1 fL (ref 80.0–100.0)
Platelets: 266 10*3/uL (ref 150–400)
RBC: 4.21 MIL/uL (ref 3.87–5.11)
RDW: 14.5 % (ref 11.5–15.5)
WBC: 10 10*3/uL (ref 4.0–10.5)
nRBC: 0 % (ref 0.0–0.2)

## 2022-06-03 LAB — GLUCOSE, CAPILLARY: Glucose-Capillary: 220 mg/dL — ABNORMAL HIGH (ref 70–99)

## 2022-06-03 NOTE — Plan of Care (Signed)

## 2022-06-03 NOTE — Discharge Summary (Cosign Needed)
ELECTROPHYSIOLOGY PROCEDURE DISCHARGE SUMMARY    Patient ID: Claire Mcgrath,  MRN: [id number], DOB/AGE: 1946/12/07 76 y.o.  Admit date: 06/01/2022 Discharge date: 06/03/2022  Primary Care Physician: Ralene Ok, MD  Primary Cardiologist: Previously Dr. Darrold Junker Electrophysiologist: Lanier Prude, MD   Discharge Diagnoses: Principal Problem:   Ventricular tachyarrhythmia [facility]) Active Problems:   DM2 (diabetes mellitus, type 2) (HCC)   HTN (hypertension)   Hyperlipemia   History of complete heart block   Chronic diastolic CHF (congestive heart failure) (HCC)   Encounter for insertion of cardiac resynchronization therapy defibrillator   Stage 3a chronic kidney disease (CKD) (HCC)    Allergies  Allergen Reactions   Kiwi Extract Anaphylaxis    Feel my throat close up   Iodine Itching and Swelling        Shrimp [Shellfish Allergy] Itching and Swelling     Procedures This Admission:  1.   Removal of a previously implanted dual-chamber pacemaker and insertion of a new dual-chamber Medtronic ICD with capping of the old 15-year passive fixation RV pacing lead 06/02/22 by Dr. Ladona Ridgel 2.  CXR on 06/03/22 demonstrated no pneumothorax status post device implantation.   Brief HPI: Claire Mcgrath is a 76 y.o. female with history of congestive heart failure (suspect diastolic per prior echo), complete heart block s/p prior pacemaker, stroke, hypertension, hyperlipidemia, diabetes, CKD stage 3a by labs, and asthma. On 04/11/2022 she was seen by Dr. Ludwig Clarks. She reported mild DOE and LE edema for about a week. She had a fall in her garden 1 week prior with a right leg injury. She was referred to EP as previous device interrogation of her pacemaker at the [facility] clinic showed runs of VT/VF. She denied any syncope. Per Dr. Lovena Neighbours note, "  The patient has had several ventricular high rate episodes with EGM's confirming ventricular fibrillation.  Thankfully she has not had a  syncopal episode with these episodes mostly occurring in the nighttime hours.  Despite them being asymptomatic, they do require prompt evaluation." Dr. Lalla Brothers recommended proceeding to the emergency department with plan for cardiac catheterization and upgrade to defibrillator.   Hospital Course:  Mg level was 1.7 so this was repleted. 2D echo 06/01/22 showed EF 60-65%, mild LVH. Cardiac catheterization showed minimal nonobstructive CAD with mild luminal irregularity of the RCA, smooth 20% ostial left circumflex stenosis, and normal-appearing LAD. Risks, benefits, and alternatives to ICD implantation were reviewed with the patient who wished to proceed. The patient underwent removal of a previously implanted dual-chamber pacemaker and insertion of a new dual-chamber Medtronic ICD with capping of the old 15-year passive fixation RV pacing lead 06/02/22. Left chest was without hematoma or ecchymosis. The device was interrogated and found to be functioning normally. CXR was obtained and demonstrated no pneumothorax status post device implantation..  Wound care, arm mobility, and restrictions were reviewed with the patient. Dr. Ladona Ridgel felt the patient could resume driving per usual protocol (after wound check) since she had not had syncope. Dr. Ladona Ridgel reviewed the labs. Given her mild rise in kidney function post cath to 1.54 (baseline around 1.2), he recommends obtaining a BMET at time of her wound check which is scheduled for 06/15/22. I sent message to EP team to help make sure this gets done. Per our discussion, he did not recommend any additional magnesium supplementation at discharge. Regarding blood thinner therapy, the patient was advised to restart their Eliquis on 06/08/22 per Dr. Ladona Ridgel. She was asked to restart metformin on  06/05/22. Dr. Ladona Ridgel otherwise recommended to leave her prior home medication regimen the same at discharge. He recommended wound check follow-up and 3 month follow-up with Dr.  Lalla Brothers.  Dr. Ladona Ridgel has examined the patient and considered her stable for discharge to home with medications and instructions outlined per his plan.  Anticoagulation resumption Regarding blood thinner therapy, they were instructed to resume their Eliquis (medication name) on 06/08/22 AM per Dr. Ladona Ridgel.   Physical Exam: Vitals:   06/02/22 1544 06/02/22 2014 06/03/22 0402 06/03/22 0700  BP: (!) 135/106 (!) 121/50 (!) 153/58   Pulse: 60  60   Resp:  19 18   Temp:  98 F (36.7 C) 98.6 F (37 C)   TempSrc:  Oral Oral   SpO2: 98%  96%   Weight:    92 kg  Height:        Labs:   Lab Results  Component Value Date   WBC 10.0 06/03/2022   HGB 11.5 (L) 06/03/2022   HCT 35.0 (L) 06/03/2022   MCV 83.1 06/03/2022   PLT 266 06/03/2022    Recent Labs  Lab 06/01/22 1159 06/02/22 0251 06/03/22 0117  NA 139   < > 135  K 4.5   < > 4.4  CL 107   < > 104  CO2 20*   < > 21*  BUN 19   < > 25*  CREATININE 1.22*   < > 1.54*  CALCIUM 9.3   < > 9.0  PROT 8.3*  --   --   BILITOT 0.5  --   --   ALKPHOS 49  --   --   ALT 15  --   --   AST 18  --   --   GLUCOSE 141*   < > 159*   < > = values in this interval not displayed.    Discharge Medications:  Allergies as of 06/03/2022       Reactions   Kiwi Extract Anaphylaxis   Feel my throat close up   Iodine Itching, Swelling      Shrimp [shellfish Allergy] Itching, Swelling        Medication List     TAKE these medications    acetaminophen 500 MG tablet Commonly known as: TYLENOL Take 500 mg by mouth every 6 (six) hours as needed for moderate pain.   amLODipine 2.5 MG tablet Commonly known as: NORVASC Take 2.5 mg by mouth daily.   Eliquis 5 MG Tabs tablet Generic drug: apixaban Take 5 mg by mouth 2 (two) times daily. Notes to patient: IMPORTANT: You can restart your Eliquis the morning of 06/08/22.   fluticasone furoate-vilanterol 100-25 MCG/INH Aepb Commonly known as: BREO ELLIPTA Inhale 1 puff into the lungs daily as  needed (shortness of breath, wheezing).   Insulin Lispro Prot & Lispro (75-25) 100 UNIT/ML Kwikpen Commonly known as: HUMALOG 75/25 MIX Inject 20 Units into the skin 2 (two) times daily. Take 20 units in the am and 10 in the pm   lisinopril-hydrochlorothiazide 10-12.5 MG tablet Commonly known as: ZESTORETIC Take 1 tablet by mouth at bedtime.   metFORMIN 1000 MG tablet Commonly known as: GLUCOPHAGE Take 1,000 mg by mouth at bedtime. Notes to patient: Your metformin has to be stopped for at least 48 hours after a heart catheterization. You can restart this on 06/05/22.   metoprolol succinate 25 MG 24 hr tablet Commonly known as: TOPROL-XL Take 25 mg by mouth at bedtime.   pravastatin 20 MG tablet  Commonly known as: PRAVACHOL Take 10 mg by mouth at bedtime.        Disposition:  Discharge Instructions     Diet - low sodium heart healthy   Complete by: As directed    Discharge instructions   Complete by: As directed    You can restart your Eliquis (apixaban) the morning of 06/08/22.  Your metformin has to be stopped for at least 48 hours after a heart catheterization. You can restart this on 06/05/22.  Regarding cardiac catheterization site: Keep procedure site clean & dry. If you notice increased pain, swelling, bleeding or pus, call/return!  You may shower, but no soaking baths/hot tubs/pools for 1 week.   Increase activity slowly   Complete by: As directed    Please see end of your After-Visit Summary for instructions on wound care, activity, and bathing.       Follow-up Information     [facility].   Specialty: Cardiology Why: [place] - See appointments as listed - you will have a wound check visit with device clinic nurse on Thursday Jun 15, 2022 at 2:00 PM and a follow-up with Dr. Quentin Ore on Friday Sep 08, 2022 at 3:30 PM. Please arrive 15 minutes early to check in. Contact information: [address], [place] [phone]                Duration of Discharge Encounter: Greater than 30 minutes including physician time.  Signed, Charlie Pitter, PA-C  06/03/2022 11:49 AM  EP Attending  Patient seen and examined. Agree with above. See my rounding note as well. The patient is stable for DC home. Usual followup.   Carleene Overlie Taylor,MD

## 2022-06-03 NOTE — Progress Notes (Signed)
BMET per Dr. Ladona Ridgel @ wound check visit

## 2022-06-03 NOTE — Progress Notes (Signed)
Progress Note  Patient Name: Claire Mcgrath Date of Encounter: 06/03/2022  Primary Cardiologist: None   Subjective   Denies chest pain or sob.   Inpatient Medications    Scheduled Meds:  amLODipine  2.5 mg Oral Daily   aspirin  81 mg Oral Daily   hydrochlorothiazide  12.5 mg Oral QHS   insulin aspart protamine- aspart  10 Units Subcutaneous Q supper   insulin aspart protamine- aspart  20 Units Subcutaneous Q breakfast   lisinopril  10 mg Oral Daily   metoprolol succinate  25 mg Oral QHS   pravastatin  10 mg Oral QHS   sodium chloride flush  3 mL Intravenous Q12H   sodium chloride flush  3 mL Intravenous Q12H   sodium chloride flush  3 mL Intravenous Q12H   Continuous Infusions:  sodium chloride     sodium chloride     PRN Meds: sodium chloride, sodium chloride, acetaminophen, acetaminophen, acetaminophen, fluticasone furoate-vilanterol, ondansetron (ZOFRAN) IV, ondansetron (ZOFRAN) IV, ondansetron (ZOFRAN) IV, sodium chloride flush, sodium chloride flush   Vital Signs    Vitals:   06/02/22 1544 06/02/22 2014 06/03/22 0402 06/03/22 0700  BP: (!) 135/106 (!) 121/50 (!) 153/58   Pulse: 60  60   Resp:  19 18   Temp:  98 F (36.7 C) 98.6 F (37 C)   TempSrc:  Oral Oral   SpO2: 98%  96%   Weight:    92 kg  Height:        Intake/Output Summary (Last 24 hours) at 06/03/2022 0907 Last data filed at 06/03/2022 0700 Gross per 24 hour  Intake 1196.55 ml  Output 1150 ml  Net 46.55 ml   Filed Weights   06/01/22 1150 06/02/22 0235 06/03/22 0700  Weight: 94.3 kg 93.5 kg 92 kg    Telemetry    Atrial fib with ventricular pacing - Personally Reviewed  ECG     - Personally Reviewed  Physical Exam   GEN: No acute distress.   Neck: No JVD Cardiac: RRR, no murmurs, rubs, or gallops.  Respiratory: Clear to auscultation bilaterally. No hematoma. GI: Soft, nontender, non-distended  MS: No edema; No deformity. Neuro:  Nonfocal  Psych: Normal affect   Labs     Chemistry Recent Labs  Lab 06/01/22 1159 06/02/22 0251 06/03/22 0117  NA 139 138 135  K 4.5 4.3 4.4  CL 107 104 104  CO2 20* 23 21*  GLUCOSE 141* 127* 159*  BUN 19 18 25*  CREATININE 1.22* 1.17* 1.54*  CALCIUM 9.3 9.5 9.0  PROT 8.3*  --   --   ALBUMIN 4.0  --   --   AST 18  --   --   ALT 15  --   --   ALKPHOS 49  --   --   BILITOT 0.5  --   --   GFRNONAA 46* 48* 35*  ANIONGAP 12 11 10      Hematology Recent Labs  Lab 06/01/22 1159 06/03/22 0117  WBC 9.8 10.0  RBC 4.32 4.21  HGB 11.6* 11.5*  HCT 37.5 35.0*  MCV 86.8 83.1  MCH 26.9 27.3  MCHC 30.9 32.9  RDW 14.5 14.5  PLT 274 266    Cardiac EnzymesNo results for input(s): "TROPONINI" in the last 168 hours. No results for input(s): "TROPIPOC" in the last 168 hours.   BNP Recent Labs  Lab 06/01/22 1159  BNP 106.8*     DDimer No results for input(s): "DDIMER" in the last  168 hours.   Radiology    EP PPM/ICD IMPLANT  Result Date: 06/02/2022 Conclusion: Successful removal of a previously implanted dual-chamber pacemaker and insertion of a dual-chamber ICD with insertion of a new active-fixation defibrillation lead in a patient with chronic A-fib, complete heart block, who had developed long runs of polymorphic ventricular tachycardia/ventricular fibrillation. Sharrell Ku, MD   CARDIAC CATHETERIZATION  Result Date: 06/02/2022   Suezanne Jacquet Cx lesion is 20% stenosed. Minimal nonobstructive CAD with mild luminal irregularity of the RCA, smooth 20% ostial left circumflex stenosis, and normal-appearing LAD. LVEDP 19 mmHg. Patent left subclavian vein at site of prior pacemaker insertion. RECOMMENDATION: Medical therapy with low-dose aspirin, optimal blood pressure control and lipid management.  Patient will undergo a single-chamber ICD upgrade of her pacemaker later today by Dr. Ladona Ridgel.   ECHOCARDIOGRAM COMPLETE  Result Date: 06/01/2022    ECHOCARDIOGRAM REPORT   Patient Name:   Claire Mcgrath Date of Exam: 06/01/2022  Medical Rec #:  [id number]      Height:       64.0 in Accession #:    [id number]     Weight:       208.0 lb Date of Birth:  1946/09/27     BSA:          1.989 m Patient Age:    75 years       BP:           135/57 mmHg Patient Gender: F              HR:           67 bpm. Exam Location:  Inpatient Procedure: 2D Echo, Cardiac Doppler and Color Doppler Indications:    Ventricular tachycardia  History:        Patient has prior history of Echocardiogram examinations, most                 recent 08/29/2021. CHF, Pacemaker, COPD, Arrythmias:Atrial                 Fibrillation and Ventricular Fibrillation,                 Signs/Symptoms:Shortness of Breath; Risk Factors:Diabetes,                 Hypertension, Dyslipidemia and Current Smoker. History of heart                 block and CVA.  Sonographer:    Ross Ludwig RDCS (AE) Referring Phys: [id number] Mariam Dollar [facility] IMPRESSIONS  1. Left ventricular ejection fraction, by estimation, is 60 to 65%. The left ventricle has normal function. The left ventricle has no regional wall motion abnormalities. There is mild concentric left ventricular hypertrophy. Left ventricular diastolic parameters are indeterminate.  2. Right ventricular systolic function is normal. The right ventricular size is normal. There is normal pulmonary artery systolic pressure.  3. The mitral valve is normal in structure. Mild mitral valve regurgitation. No evidence of mitral stenosis.  4. The aortic valve is normal in structure. Aortic valve regurgitation is not visualized. Aortic valve sclerosis/calcification is present, without any evidence of aortic stenosis.  5. The inferior vena cava is normal in size with greater than 50% respiratory variability, suggesting right atrial pressure of 3 mmHg. FINDINGS  Left Ventricle: Left ventricular ejection fraction, by estimation, is 60 to 65%. The left ventricle has normal function. The left ventricle has no regional wall motion abnormalities. The left  ventricular internal cavity size was  normal in size. There is  mild concentric left ventricular hypertrophy. Left ventricular diastolic parameters are indeterminate. Right Ventricle: The right ventricular size is normal. No increase in right ventricular wall thickness. Right ventricular systolic function is normal. There is normal pulmonary artery systolic pressure. The tricuspid regurgitant velocity is 2.14 m/s, and  with an assumed right atrial pressure of 8 mmHg, the estimated right ventricular systolic pressure is 123456 mmHg. Left Atrium: Left atrial size was normal in size. Right Atrium: Right atrial size was normal in size. Pericardium: There is no evidence of pericardial effusion. Presence of epicardial fat layer. Mitral Valve: The mitral valve is normal in structure. Mild mitral valve regurgitation. No evidence of mitral valve stenosis. Tricuspid Valve: The tricuspid valve is normal in structure. Tricuspid valve regurgitation is not demonstrated. No evidence of tricuspid stenosis. Aortic Valve: The aortic valve is normal in structure. Aortic valve regurgitation is not visualized. Aortic valve sclerosis/calcification is present, without any evidence of aortic stenosis. Aortic valve mean gradient measures 3.0 mmHg. Aortic valve peak  gradient measures 4.9 mmHg. Aortic valve area, by VTI measures 1.75 cm. Pulmonic Valve: The pulmonic valve was normal in structure. Pulmonic valve regurgitation is not visualized. No evidence of pulmonic stenosis. Aorta: The aortic root is normal in size and structure. Venous: The inferior vena cava is normal in size with greater than 50% respiratory variability, suggesting right atrial pressure of 3 mmHg. IAS/Shunts: No atrial level shunt detected by color flow Doppler. Additional Comments: A device lead is visualized.  LEFT VENTRICLE PLAX 2D LVIDd:         4.80 cm   Diastology LVIDs:         2.90 cm   LV e' medial:    7.62 cm/s LV PW:         1.00 cm   LV E/e' medial:  13.3 LV  IVS:        1.10 cm   LV e' lateral:   10.40 cm/s LVOT diam:     2.10 cm   LV E/e' lateral: 9.7 LV SV:         46 LV SV Index:   23 LVOT Area:     3.46 cm  RIGHT VENTRICLE             IVC RV Basal diam:  3.50 cm     IVC diam: 2.10 cm RV Mid diam:    3.70 cm RV S prime:     11.70 cm/s TAPSE (M-mode): 1.8 cm LEFT ATRIUM             Index        RIGHT ATRIUM           Index LA diam:        3.60 cm 1.81 cm/m   RA Area:     16.80 cm LA Vol (A2C):   52.3 ml 26.29 ml/m  RA Volume:   41.90 ml  21.06 ml/m LA Vol (A4C):   48.4 ml 24.33 ml/m LA Biplane Vol: 53.1 ml 26.69 ml/m  AORTIC VALVE AV Area (Vmax):    1.87 cm AV Area (Vmean):   1.78 cm AV Area (VTI):     1.75 cm AV Vmax:           111.00 cm/s AV Vmean:          74.900 cm/s AV VTI:            0.265 m AV Peak Grad:  4.9 mmHg AV Mean Grad:      3.0 mmHg LVOT Vmax:         59.90 cm/s LVOT Vmean:        38.600 cm/s LVOT VTI:          0.134 m LVOT/AV VTI ratio: 0.51  AORTA Ao Root diam: 3.20 cm Ao Asc diam:  3.50 cm MITRAL VALVE                TRICUSPID VALVE MV Area (PHT): 7.90 cm     TR Peak grad:   18.3 mmHg MV Decel Time: 96 msec      TR Vmax:        214.00 cm/s MV E velocity: 101.00 cm/s MV A velocity: 35.30 cm/s   SHUNTS MV E/A ratio:  2.86         Systemic VTI:  0.13 m                             Systemic Diam: 2.10 cm Lavona Mound Tobb DO Electronically signed by Thomasene Ripple DO Signature Date/Time: 06/01/2022/5:39:37 PM    Final    DG Chest 2 View  Result Date: 06/01/2022 CLINICAL DATA:  shob EXAM: CHEST - 2 VIEW COMPARISON:  April 11, 2022 FINDINGS: Again seen is the left subclavian dual lead pacemaker, stable. The heart size and mediastinal contours are within normal limits. Both lungs are clear. The visualized skeletal structures are unremarkable. IMPRESSION: No active cardiopulmonary disease. Electronically Signed   By: Marjo Bicker M.D.   On: 06/01/2022 12:25    Cardiac Studies   Cxr pendign  Patient Profile     76 y.o. female admitted with  PMVT on her PPM, s/p ICD upgrade  Assessment & Plan    PMVT - she has been stable since her visit. No change in her meds. ICD - her CXR is pending. If ok, she can go home. Interrogation of her device under my direction demonstrates normal ICD function.  Atrial fib - she will restart her Eliquis on 06/08/22.  CHB - she is asymptomatic s/p ICD insertion.   For questions or updates, please contact [facility] Please consult [email or web address] for contact info under Cardiology/STEMI.      Signed, Lewayne Bunting, MD  06/03/2022, 9:07 AM

## 2022-06-03 NOTE — Discharge Instructions (Addendum)
After Your ICD (Implantable Cardiac Defibrillator)   You have a Medtronic ICD  ACTIVITY Do not lift your arm above shoulder height for 1 week after your procedure. After 7 days, you may progress as below.  You should remove your sling 24 hours after your procedure, unless otherwise instructed by your provider.     Saturday June 10, 2022  Sunday June 11, 2022 Monday June 12, 2022 Tuesday June 13, 2022   Do not lift, push, pull, or carry anything over 10 pounds with the affected arm until 6 weeks (Saturday July 15, 2022 ) after your procedure.   You may drive AFTER your wound check, unless you have been told otherwise by your provider.   Ask your healthcare provider when you can go back to work   INCISION/Dressing You can restart your Eliquis (apixaban) on June 08, 2022.  If large square, outer bandage is left in place, this can be removed after 24 hours from your procedure. Do not remove steri-strips or glue as below.   Monitor your defibrillator site for redness, swelling, and drainage. Call the device clinic at [phone] if you experience these symptoms or fever/chills.  If your incision is sealed with Steri-strips or staples, you may shower 7 days after your procedure or when told by your provider. Do not remove the steri-strips or let the shower hit directly on your site. You may wash around your site with soap and water.    If you were discharged in a sling, please do not wear this during the day more than 48 hours after your surgery unless otherwise instructed. This may increase the risk of stiffness and soreness in your shoulder.   Avoid lotions, ointments, or perfumes over your incision until it is well-healed.  You may use a hot tub or a pool AFTER your wound check appointment if the incision is completely closed.  Your ICD is designed to protect you from life threatening heart rhythms. Because of this, you may receive a shock.   1 shock with no symptoms:  Call the  office during business hours. 1 shock with symptoms (chest pain, chest pressure, dizziness, lightheadedness, shortness of breath, overall feeling unwell):  Call 911. If you experience 2 or more shocks in 24 hours:  Call 911. If you receive a shock, you should not drive for 6 months per the [place] DMV IF you receive appropriate therapy from your ICD.   ICD Alerts:  Some alerts are vibratory and others beep. These are NOT emergencies. Please call our office to let us know. If this occurs at night or on weekends, it can wait until the next business day. Send a remote transmission.  If your device is capable of reading fluid status (for heart failure), you will be offered monthly monitoring to review this with you.   DEVICE MANAGEMENT Remote monitoring is used to monitor your ICD from home. This monitoring is scheduled every 91 days by our office. It allows [place] to keep an eye on the functioning of your device to ensure it is working properly. You will routinely see your Electrophysiologist annually (more often if necessary).   You should receive your ID card for your new device in 4-8 weeks. Keep this card with you at all times once received. Consider wearing a medical alert bracelet or necklace.  Your ICD  may be MRI compatible. This will be discussed at your next office visit/wound check.  You should avoid contact with strong electric or magnetic fields.  Do not use amateur (ham) radio equipment or electric (arc) welding torches. MP3 player headphones with magnets should not be used. Some devices are safe to use if held at least 12 inches (30 cm) from your defibrillator. These include power tools, lawn mowers, and speakers. If you are unsure if something is safe to use, ask your health care provider.  When using your cell phone, hold it to the ear that is on the opposite side from the defibrillator. Do not leave your cell phone in a pocket over the defibrillator.  You may safely use electric blankets,  heating pads, computers, and microwave ovens.  Call the office right away if: You have chest pain. You feel more than one shock. You feel more short of breath than you have felt before. You feel more light-headed than you have felt before. Your incision starts to open up.  This information is not intended to replace advice given to you by your health care provider. Make sure you discuss any questions you have with your health care provider.

## 2022-06-05 ENCOUNTER — Encounter (HOSPITAL_COMMUNITY): Payer: Self-pay | Admitting: Internal Medicine

## 2022-06-05 LAB — LIPOPROTEIN A (LPA): Lipoprotein (a): 91.6 nmol/L — ABNORMAL HIGH (ref <75.0)

## 2022-06-05 MED FILL — Fentanyl Citrate Preservative Free (PF) Inj 100 MCG/2ML: INTRAMUSCULAR | Qty: 2 | Status: AC

## 2022-06-07 NOTE — Addendum Note (Signed)
Addended by: Cleda Mccreedy on: 06/07/2022 09:17 AM   Modules accepted: Orders

## 2022-06-15 ENCOUNTER — Ambulatory Visit (INDEPENDENT_AMBULATORY_CARE_PROVIDER_SITE_OTHER): Payer: Medicare Other

## 2022-06-15 DIAGNOSIS — N1831 Chronic kidney disease, stage 3a: Secondary | ICD-10-CM

## 2022-06-15 NOTE — Patient Instructions (Signed)
   After Your ICD (Implantable Cardiac Defibrillator)    Monitor your defibrillator site for redness, swelling, and drainage. Call the device clinic at [phone] if you experience these symptoms or fever/chills.  Your incision was closed with Steri-strips or staples:  You may shower 7 days after your procedure and wash your incision with soap and water. Avoid lotions, ointments, or perfumes over your incision until it is well-healed.  You may use a hot tub or a pool after your wound check appointment if the incision is completely closed.  Do not lift, push or pull greater than 10 pounds with the affected arm until  July 15 2022 There are no other restrictions in arm movement after your wound check appointment.   Your ICD is designed to protect you from life threatening heart rhythms. Because of this, you may receive a shock.   1 shock with no symptoms:  Call the office during business hours. 1 shock with symptoms (chest pain, chest pressure, dizziness, lightheadedness, shortness of breath, overall feeling unwell):  Call 911. If you experience 2 or more shocks in 24 hours:  Call 911. If you receive a shock, you should not drive.  [place] DMV - no driving for 6 months if you receive appropriate therapy from your ICD.   ICD Alerts:  Some alerts are vibratory and others beep. These are NOT emergencies. Please call our office to let us know. If this occurs at night or on weekends, it can wait until the next business day. Send a remote transmission.  If your device is capable of reading fluid status (for heart failure), you will be offered monthly monitoring to review this with you.   Remote monitoring is used to monitor your ICD from home. This monitoring is scheduled every 91 days by our office. It allows [place] to keep an eye on the functioning of your device to ensure it is working properly. You will routinely see your Electrophysiologist annually (more often if necessary).

## 2022-06-15 NOTE — Progress Notes (Signed)
Wound check appointment. Steri-strips removed. Wound without redness or edema. Incision edges approximated, wound well healed. Normal device function. Thresholds, sensing, and impedances consistent with implant measurements. Device programmed at 3.5V for extra safety margin until 3 month visit. Histogram distribution appropriate for patient and level of activity. No ventricular arrhythmias noted. Patient educated about wound care, arm mobility, lifting restrictions, shock plan. ROV 09/08/2022

## 2022-06-16 ENCOUNTER — Telehealth: Payer: Self-pay

## 2022-06-16 LAB — BASIC METABOLIC PANEL
BUN/Creatinine Ratio: 19 (ref 12–28)
BUN: 22 mg/dL (ref 8–27)
CO2: 20 mmol/L (ref 20–29)
Calcium: 9.8 mg/dL (ref 8.7–10.3)
Chloride: 105 mmol/L (ref 96–106)
Creatinine, Ser: 1.14 mg/dL — ABNORMAL HIGH (ref 0.57–1.00)
Glucose: 85 mg/dL (ref 70–99)
Potassium: 4.6 mmol/L (ref 3.5–5.2)
Sodium: 140 mmol/L (ref 134–144)
eGFR: 50 mL/min/{1.73_m2} — ABNORMAL LOW (ref >59)

## 2022-06-16 NOTE — Telephone Encounter (Signed)
I spoke with the patient and she states she do have a remote monitor. She states she would like for Dr. Lalla Brothers to follow her ICD. I put the request in Carelink for her to be released to our clinic. I also called the [facility] clinic to verbally ask them to release the patient. I left a message on her voicemail.

## 2022-06-16 NOTE — Telephone Encounter (Signed)
-----   Message from Dorathy Daft, RN sent at 06/16/2022  9:41 AM EDT ----- Regarding: FW: call about remote monitoring/ enroll in carelink  ----- Message ----- From: Dorathy Daft, RN Sent: 06/15/2022   5:25 PM EDT To: Dorathy Daft, RN Subject: call about remote monitoring/ enroll in care#

## 2022-06-23 ENCOUNTER — Telehealth: Payer: Self-pay | Admitting: Cardiology

## 2022-06-23 ENCOUNTER — Telehealth: Payer: Self-pay

## 2022-06-23 MED ORDER — ATORVASTATIN CALCIUM 40 MG PO TABS: 40.0000 mg | ORAL_TABLET | Freq: Every day | ORAL | 3 refills | Status: DC

## 2022-06-23 NOTE — Telephone Encounter (Signed)
The patient has been notified of the result and verbalized understanding.  All questions (if any) were answered. Theresia Majors, RN 06/23/2022 12:16 PM

## 2022-06-23 NOTE — Telephone Encounter (Signed)
Pt c/o Shortness Of Breath: STAT if SOB developed within the last 24 hours or pt is noticeably SOB on the phone  1. Are you currently SOB (can you hear that pt is SOB on the phone)? Yes; can tell on phone  2. How long have you been experiencing SOB? Since last night   3. Are you SOB when sitting or when up moving around? Both   4. Are you currently experiencing any other symptoms? Lightheadedness, kind of having an asthma attack but not going away

## 2022-06-23 NOTE — Telephone Encounter (Signed)
Spoke with pt complaining of increasing SOB since yesterday.  She does have a history of CHF and asthma.  She denies CP, edema or dizziness.  She states she used her albuterol inhaler last night x 1 with minimal improvement.  She sleeps on one pillow and uses her CPAP which does help with SOB.  She quit smoking 7 months ago.  She does not weigh daily but does limit salt in her diet. She does not have any vital signs for review.  Pt does sound very SOB on the phone with audible wheezing.  She states she has not used either inhaler this morning.  Requested she go ahead and use inhaler while on the phone with RN.  She reports this did help ease symptoms and she does sound less SOB while talking.  Pt advised with no edema or apparent cardiac symptoms recommended she contact her PCP today and request an appointment for further evaluation.  Reviewed ED precautions.  Pt verbalizes understanding and agrees with current plan.

## 2022-06-23 NOTE — Telephone Encounter (Signed)
-----   Message from Sampson Goon, RN sent at 06/23/2022 11:56 AM EDT -----  ----- Message ----- From: Lanier Prude, MD Sent: 06/22/2022   9:54 PM EDT To: Sampson Goon, RN  Lp(a) is elevated which is a marker of risk for heart disease. Recommend you stop your Pravastatin and start Atorvastatin 40mg  by mouth once daily. , can you make this change please?   Inetta Fermo T. Sheria Lang, MD, [facility], [facility] Cardiac Electrophysiology

## 2022-06-29 ENCOUNTER — Emergency Department (HOSPITAL_COMMUNITY): Payer: Medicare Other

## 2022-06-29 ENCOUNTER — Emergency Department (HOSPITAL_COMMUNITY)
Admission: EM | Admit: 2022-06-29 | Discharge: 2022-06-29 | Disposition: A | Discharge status: Home / Self Care | Payer: Medicare Other | Attending: Emergency Medicine | Admitting: Emergency Medicine

## 2022-06-29 ENCOUNTER — Encounter (HOSPITAL_COMMUNITY): Payer: Self-pay | Admitting: Emergency Medicine

## 2022-06-29 DIAGNOSIS — E119 Type 2 diabetes mellitus without complications: Secondary | ICD-10-CM | POA: Diagnosis not present

## 2022-06-29 DIAGNOSIS — Z7901 Long term (current) use of anticoagulants: Secondary | ICD-10-CM | POA: Diagnosis not present

## 2022-06-29 DIAGNOSIS — R531 Weakness: Secondary | ICD-10-CM | POA: Diagnosis present

## 2022-06-29 DIAGNOSIS — Z950 Presence of cardiac pacemaker: Secondary | ICD-10-CM | POA: Insufficient documentation

## 2022-06-29 DIAGNOSIS — I10 Essential (primary) hypertension: Secondary | ICD-10-CM | POA: Diagnosis not present

## 2022-06-29 DIAGNOSIS — R519 Headache, unspecified: Secondary | ICD-10-CM

## 2022-06-29 DIAGNOSIS — Z794 Long term (current) use of insulin: Secondary | ICD-10-CM | POA: Diagnosis not present

## 2022-06-29 DIAGNOSIS — Z79899 Other long term (current) drug therapy: Secondary | ICD-10-CM | POA: Insufficient documentation

## 2022-06-29 DIAGNOSIS — Z7984 Long term (current) use of oral hypoglycemic drugs: Secondary | ICD-10-CM | POA: Insufficient documentation

## 2022-06-29 LAB — CUP PACEART INCLINIC DEVICE CHECK
Battery Remaining Longevity: 108 mo
Battery Voltage: 3.09 V
Brady Statistic AP VP Percent: 66.26 %
Brady Statistic AP VS Percent: 0 %
Brady Statistic AS VP Percent: 32.39 %
Brady Statistic AS VS Percent: 1.35 %
Brady Statistic RA Percent Paced: 66.51 %
Brady Statistic RV Percent Paced: 97.35 %
Date Time Interrogation Session: 20230629171553
HighPow Impedance: 58 Ohm
Implantable Lead Implant Date: 20080331
Implantable Lead Implant Date: 20230616
Implantable Lead Location: 753859
Implantable Lead Location: 753860
Implantable Lead Model: 5592
Implantable Lead Model: 6935
Implantable Pulse Generator Implant Date: 20230616
Lead Channel Impedance Value: 285 Ohm
Lead Channel Impedance Value: 361 Ohm
Lead Channel Impedance Value: 361 Ohm
Lead Channel Pacing Threshold Amplitude: 1 V
Lead Channel Pacing Threshold Pulse Width: 0.4 ms
Lead Channel Sensing Intrinsic Amplitude: 0.5 mV
Lead Channel Sensing Intrinsic Amplitude: 7.25 mV
Lead Channel Sensing Intrinsic Amplitude: 8.125 mV
Lead Channel Setting Pacing Amplitude: 3.5 V
Lead Channel Setting Pacing Pulse Width: 0.4 ms
Lead Channel Setting Sensing Sensitivity: 0.3 mV

## 2022-06-29 LAB — BASIC METABOLIC PANEL
Anion gap: 16 — ABNORMAL HIGH (ref 5–15)
BUN: 19 mg/dL (ref 8–23)
CO2: 21 mmol/L — ABNORMAL LOW (ref 22–32)
Calcium: 9.6 mg/dL (ref 8.9–10.3)
Chloride: 103 mmol/L (ref 98–111)
Creatinine, Ser: 1.16 mg/dL — ABNORMAL HIGH (ref 0.44–1.00)
GFR, Estimated: 49 mL/min — ABNORMAL LOW (ref >60)
Glucose, Bld: 161 mg/dL — ABNORMAL HIGH (ref 70–99)
Potassium: 4.2 mmol/L (ref 3.5–5.1)
Sodium: 140 mmol/L (ref 135–145)

## 2022-06-29 LAB — CBC
HCT: 34.6 % — ABNORMAL LOW (ref 36.0–46.0)
Hemoglobin: 10.8 g/dL — ABNORMAL LOW (ref 12.0–15.0)
MCH: 26.7 pg (ref 26.0–34.0)
MCHC: 31.2 g/dL (ref 30.0–36.0)
MCV: 85.4 fL (ref 80.0–100.0)
Platelets: 255 10*3/uL (ref 150–400)
RBC: 4.05 MIL/uL (ref 3.87–5.11)
RDW: 14.6 % (ref 11.5–15.5)
WBC: 10.6 10*3/uL — ABNORMAL HIGH (ref 4.0–10.5)
nRBC: 0 % (ref 0.0–0.2)

## 2022-06-29 LAB — CBG MONITORING, ED: Glucose-Capillary: 143 mg/dL — ABNORMAL HIGH (ref 70–99)

## 2022-06-29 MED ORDER — METOPROLOL TARTRATE 25 MG PO TABS: 25.0000 mg | ORAL_TABLET | Freq: Once | ORAL | Status: DC

## 2022-06-29 MED ORDER — AMLODIPINE BESYLATE 5 MG PO TABS
2.5000 mg | ORAL_TABLET | Freq: Once | ORAL | Status: AC
  Administered 2022-06-29: 2.5 mg via ORAL
  Filled 2022-06-29: qty 1

## 2022-06-29 MED ORDER — SODIUM CHLORIDE 0.9 % IV BOLUS
500.0000 mL | Freq: Once | INTRAVENOUS | Status: AC
  Administered 2022-06-29: 500 mL via INTRAVENOUS

## 2022-06-29 NOTE — ED Notes (Signed)
Patient did not respond to call for vitals

## 2022-06-29 NOTE — Discharge Instructions (Signed)
Your CT scans and your lab work today were unremarkable  Please continue your current medications  Please see your doctor in a week to recheck your blood pressure  Return to ER if you have worse headache, vomiting, weakness or numbness or dizziness

## 2022-06-29 NOTE — ED Notes (Signed)
Patient transported to CT 

## 2022-06-29 NOTE — ED Notes (Signed)
RN reviewed discharge instructions with pt. Pt verbalized understanding and had no further questions. VSS upon discharge.  

## 2022-06-29 NOTE — ED Triage Notes (Signed)
Patient BIB [facility] from home for evaluation of a "wooshing sensation across her foreehad at 1000 this morning, it felt like a flood of water across [my] head". After episode, patient felt weak. Patient has pacemaker, is in paced rhythm. Patient is alert, oriented, and in no apparent distress at this time.  BP 164/67 CBG 173 96% on room air HR 68

## 2022-06-29 NOTE — ED Notes (Signed)
Pt successfully drank cup of water °

## 2022-06-29 NOTE — ED Provider Triage Note (Signed)
Emergency Medicine Provider Triage Evaluation Note  Claire Mcgrath , Mcgrath 76 y.o. female  was evaluated in triage.  Pt complains of generalized weakness onset today.  Patient notes her blood pressure was elevated today at 172 systolically when she noticed Mcgrath whooshing sensation across her forehead that felt like Mcgrath flood of water.  Denies headache, diplopia, chest pain.  No meds tried.  Patient takes Eliquis.   Review of Systems  Positive: As per HPI Negative:   Physical Exam  BP (!) 160/65   Pulse (!) 59   Temp 98.1 F (36.7 C)   Resp 15   SpO2 97%  Gen:   Awake, no distress   Resp:  Normal effort  MSK:   Moves extremities without difficulty  Other:  No focal neurological deficits on exam.  Cranial nerves II through XII intact.  Negative pronator drift.  Medical Decision Making  Medically screening exam initiated at 2:42 PM.  Appropriate orders placed.  Claire Mcgrath was informed that the remainder of the evaluation will be completed by another provider, this initial triage assessment does not replace that evaluation, and the importance of remaining in the ED until their evaluation is complete.  Work-up initiated   Claire Montoya A, PA-C 06/29/22 1501

## 2022-06-29 NOTE — ED Provider Notes (Signed)
[facility] EMERGENCY DEPARTMENT Provider Note   CSN: [id number] Arrival date & time: 06/29/22  1405     History  Chief Complaint  Patient presents with   Weakness    Claire Mcgrath is a 76 y.o. female history of hypertension, V. Tach with pacemaker, diabetes, previous stroke on Eliquis here presenting with headache.  Patient states that this afternoon, she had sudden onset of swishing sound over her forehead with headache.  Patient denies any dizziness or double vision or trouble speaking.  She states that her blood pressure was elevated at that time.  She has history of hypertension and is compliant with her meds patient states that she had a stroke about a year ago but her symptoms are much more severe than current symptoms.  Of note, patient has a pacemaker that is not MRI compatible.  Denies any chest pain or palpitation  The history is provided by the patient.       Home Medications Prior to Admission medications   Medication Sig Start Date End Date Taking? Authorizing Provider  acetaminophen (TYLENOL) 500 MG tablet Take 500 mg by mouth every 6 (six) hours as needed for moderate pain.    [provider]  amLODipine (NORVASC) 2.5 MG tablet Take 2.5 mg by mouth daily. 05/22/22   [provider]  atorvastatin (LIPITOR) 40 MG tablet Take 1 tablet (40 mg total) by mouth daily. 06/23/22   Lanier Prude, MD  ELIQUIS 5 MG TABS tablet Take 5 mg by mouth 2 (two) times daily.  05/15/17   [provider]  fluticasone furoate-vilanterol (BREO ELLIPTA) 100-25 MCG/INH AEPB Inhale 1 puff into the lungs daily as needed (shortness of breath, wheezing).    [provider]  Insulin Lispro Prot & Lispro (HUMALOG 75/25 MIX) (75-25) 100 UNIT/ML Kwikpen Inject 20 Units into the skin 2 (two) times daily. Take 20 units in the am and 10 in the pm    [provider]  lisinopril-hydrochlorothiazide (PRINZIDE,ZESTORETIC) 10-12.5 MG tablet Take 1  tablet by mouth at bedtime.    [provider]  metFORMIN (GLUCOPHAGE) 1000 MG tablet Take 1,000 mg by mouth at bedtime.    [provider]  metoprolol succinate (TOPROL-XL) 25 MG 24 hr tablet Take 25 mg by mouth at bedtime. 05/09/17   [provider]      Allergies    Kiwi extract, Iodine, and Shrimp [shellfish allergy]    Review of Systems   Review of Systems  Neurological:  Positive for weakness and headaches.  All other systems reviewed and are negative.   Physical Exam Updated Vital Signs BP (!) 112/100   Pulse 62   Temp 98.1 F (36.7 C)   Resp 19   SpO2 97%  Physical Exam Vitals and nursing note reviewed.  Constitutional:      Comments: Chronically ill  HENT:     Head: Normocephalic.     Nose: Nose normal.     Mouth/Throat:     Mouth: Mucous membranes are moist.  Eyes:     Extraocular Movements: Extraocular movements intact.     Pupils: Pupils are equal, round, and reactive to light.  Cardiovascular:     Rate and Rhythm: Normal rate and regular rhythm.     Pulses: Normal pulses.     Heart sounds: Normal heart sounds.  Pulmonary:     Effort: Pulmonary effort is normal.     Breath sounds: Normal breath sounds.  Abdominal:  General: Abdomen is flat.     Palpations: Abdomen is soft.  Musculoskeletal:        General: Normal range of motion.     Cervical back: Normal range of motion and neck supple.  Skin:    General: Skin is warm.     Capillary Refill: Capillary refill takes less than 2 seconds.  Neurological:     General: No focal deficit present.     Mental Status: She is oriented to person, place, and time.     Comments: Cranial nerves II to XII intact.  Patient has normal strength and sensation bilateral arms and legs.  Normal finger-to-nose bilaterally  Psychiatric:        Mood and Affect: Mood normal.        Behavior: Behavior normal.     ED Results / Procedures / Treatments   Labs (all labs ordered are listed, but only  abnormal results are displayed) Labs Reviewed  BASIC METABOLIC PANEL - Abnormal; Notable for the following components:      Result Value   CO2 21 (*)    Glucose, Bld 161 (*)    Creatinine, Ser 1.16 (*)    GFR, Estimated 49 (*)    Anion gap 16 (*)    All other components within normal limits  CBC - Abnormal; Notable for the following components:   WBC 10.6 (*)    Hemoglobin 10.8 (*)    HCT 34.6 (*)    All other components within normal limits  CBG MONITORING, ED - Abnormal; Notable for the following components:   Glucose-Capillary 143 (*)    All other components within normal limits  URINALYSIS, ROUTINE W REFLEX MICROSCOPIC    EKG EKG Interpretation  Date/Time:  Thursday June 29 2022 14:32:36 EDT Ventricular Rate:  62 PR Interval:    QRS Duration: 148 QT Interval:  490 QTC Calculation: 497 R Axis:   -77 Text Interpretation: Ventricular-paced rhythm Abnormal ECG When compared with ECG of 03-Jun-2022 04:10, PREVIOUS ECG IS PRESENT Confirmed by Claire Mcgrath [phone]) on 06/29/2022 5:42:19 PM  Radiology No results found.  Procedures Procedures    Medications Ordered in ED Medications  sodium chloride 0.9 % bolus 500 mL (has no administration in time range)    ED Course/ Medical Decision Making/ A&P Clinical Course as of 06/29/22 1824  Thu Jun 29, 2022  1457 MCV: 85.4 [GL]    Clinical Course User Index [GL] Claire Leach, PA-C                           Medical Decision Making Claire Mcgrath is a 76 y.o. female here presenting with headache and hypertension.  Consider subarachnoid hemorrhage versus symptomatic hypertension.  Patient has nonfocal neuro exam right now and I have low suspicion for stroke.  Patient cannot get MRI due to her pacemaker that is not MRI compatible.  Patient blood pressure is normal on arrival.  Initially ordered a CTA head and neck but patient has iodine allergy so CT noncontrast was ordered  8:38 PM I reviewed patient's labs and imaging  studies.  Patient's labs were unremarkable.  CT head did not show any bleed.  Blood pressure is stabilized between 1 30-1 50.  I gave her home dose of her blood pressure medicine.  This point, patient is stable for discharge.  Told her to monitor blood pressure closely.  Problems Addressed: Hypertension, unspecified type: acute illness or injury Nonintractable headache, unspecified  chronicity pattern, unspecified headache type: acute illness or injury  Amount and/or Complexity of Data Reviewed Labs: ordered. Decision-making details documented in ED Course. Radiology: ordered and independent interpretation performed. Decision-making details documented in ED Course.  Risk Prescription drug management.    Final Clinical Impression(s) / ED Diagnoses Final diagnoses:  None    Rx / DC Orders ED Discharge Orders     None         Claire Pander, MD 06/29/22 2040

## 2022-09-08 ENCOUNTER — Encounter: Payer: Medicare Other | Admitting: Cardiology

## 2022-09-14 NOTE — Progress Notes (Deleted)
Electrophysiology Office Note Date: 09/14/2022  ID:  Claire Mcgrath, DOB [birth year], MRN [id number]  PCP: Jilda Panda, MD Primary Cardiologist: None Electrophysiologist: Vickie Epley, MD   CC: Routine ICD follow-up  Claire Mcgrath is a 76 y.o. female seen today for Vickie Epley, MD for routine electrophysiology followup. Since last being seen in our clinic the patient reports doing ***.  she denies chest pain, palpitations, dyspnea, PND, orthopnea, nausea, vomiting, dizziness, syncope, edema, weight gain, or early satiety.     She has not had ICD shocks.   Device History: Medtronic Dual Chamber PPM implanted 2008 -> ??? ->New RV lead and ICD upgrade 05/2022 for VT / CHB  Past Medical History:  Diagnosis Date   Asthma    CHF (congestive heart failure) (HCC)    Diabetes mellitus without complication ([place])    Heart block    Hyperlipidemia    Hypertension    Stroke [facility])    Past Surgical History:  Procedure Laterality Date   ABDOMINAL HYSTERECTOMY     BREAST LUMPECTOMY     left   EP IMPLANTABLE DEVICE     ICD IMPLANT N/A 06/02/2022   Procedure: ICD IMPLANT;  Surgeon: Evans Lance, MD;  Location: [place] CV LAB;  Service: Cardiovascular;  Laterality: N/A;   LEFT HEART CATH AND CORONARY ANGIOGRAPHY N/A 06/02/2022   Procedure: LEFT HEART CATH AND CORONARY ANGIOGRAPHY;  Surgeon: Troy Sine, MD;  Location: [place] CV LAB;  Service: Cardiovascular;  Laterality: N/A;   PACEMAKER INSERTION     PPM GENERATOR REMOVAL N/A 06/02/2022   Procedure: PPM GENERATOR REMOVAL;  Surgeon: Evans Lance, MD;  Location: [place] CV LAB;  Service: Cardiovascular;  Laterality: N/A;    Current Outpatient Medications  Medication Sig Dispense Refill   acetaminophen (TYLENOL) 500 MG tablet Take 500 mg by mouth every 6 (six) hours as needed for moderate pain.     amLODipine (NORVASC) 2.5 MG tablet Take 2.5 mg by mouth daily.     atorvastatin (LIPITOR) 40 MG tablet Take  1 tablet (40 mg total) by mouth daily. 90 tablet 3   ELIQUIS 5 MG TABS tablet Take 5 mg by mouth 2 (two) times daily.      fluticasone furoate-vilanterol (BREO ELLIPTA) 100-25 MCG/INH AEPB Inhale 1 puff into the lungs daily as needed (shortness of breath, wheezing).     Insulin Lispro Prot & Lispro (HUMALOG 75/25 MIX) (75-25) 100 UNIT/ML Kwikpen Inject 20 Units into the skin 2 (two) times daily. Take 20 units in the am and 10 in the pm     lisinopril-hydrochlorothiazide (PRINZIDE,ZESTORETIC) 10-12.5 MG tablet Take 1 tablet by mouth at bedtime.     metFORMIN (GLUCOPHAGE) 1000 MG tablet Take 1,000 mg by mouth at bedtime.     metoprolol succinate (TOPROL-XL) 25 MG 24 hr tablet Take 25 mg by mouth at bedtime.     No current facility-administered medications for this visit.    Allergies:   Kiwi extract, Iodine, and Shrimp [shellfish allergy]   Social History: Social History   Socioeconomic History   Marital status: Married    Spouse name: Not on file   Number of children: Not on file   Years of education: Not on file   Highest education level: Not on file  Occupational History   Not on file  Tobacco Use   Smoking status: Every Day    Packs/day: 0.50    Types: Cigarettes   Smokeless tobacco: Never  Substance and Sexual Activity   Alcohol use: Yes    Comment: occassional   Drug use: No   Sexual activity: Not on file  Other Topics Concern   Not on file  Social History Narrative   Not on file   Social Determinants of Health   Financial Resource Strain: Not on file  Food Insecurity: Not on file  Transportation Needs: Not on file  Physical Activity: Not on file  Stress: Not on file  Social Connections: Not on file  Intimate Partner Violence: Not on file    Family History: Family History  Problem Relation Age of Onset   Congestive Heart Failure Mother    Heart attack Father    Stroke Maternal Aunt     Review of Systems: All other systems reviewed and are otherwise  negative except as noted above.   Physical Exam: There were no vitals filed for this visit.   GEN- The patient is well appearing, alert and oriented x 3 today.   HEENT: normocephalic, atraumatic; sclera clear, conjunctiva pink; hearing intact; oropharynx clear; neck supple, no JVP Lymph- no cervical lymphadenopathy Lungs- Clear to ausculation bilaterally, normal work of breathing.  No wheezes, rales, rhonchi Heart- Regular  rate and rhythm, no murmurs, rubs or gallops, PMI not laterally displaced GI- soft, non-tender, non-distended, bowel sounds present, no hepatosplenomegaly Extremities- no clubbing or cyanosis. No peripheral edema; DP/PT/radial pulses 2+ bilaterally MS- no significant deformity or atrophy Skin- warm and dry, no rash or lesion; ICD pocket well healed Psych- euthymic mood, full affect Neuro- strength and sensation are intact  ICD interrogation- reviewed in detail today,  See PACEART report  EKG:  EKG is not ordered today. Personal review of EKG ordered {Blank single:19197::"today","***"} shows ***  Recent Labs: 06/01/2022: ALT 15; B Natriuretic Peptide 106.8 06/02/2022: Magnesium 1.7 06/29/2022: BUN 19; Creatinine, Ser 1.16; Hemoglobin 10.8; Platelets 255; Potassium 4.2; Sodium 140   Wt Readings from Last 3 Encounters:  06/29/22 202 lb 13.2 oz (92 kg)  06/03/22 202 lb 14.4 oz (92 kg)  06/01/22 208 lb 3.2 oz (94.4 kg)     Other studies Reviewed: Additional studies/ records that were reviewed today include: Previous EP office notes.   Assessment and Plan:  1.  VF s/p Medtronic dual chamber ICD  euvolemic today Stable on an appropriate medical regimen Normal ICD function See Pace Art report No changes today  2. CHB ***   Current medicines are reviewed at length with the patient today.   =  Labs/ tests ordered today include: *** No orders of the defined types were placed in this encounter.   Disposition:   Follow up with Dr. Lalla Brothers in 6 months    Signed, Graciella Freer, PA-C  09/14/2022 11:51 AM  [facility] [address] [place] [address] [phone] (office) [phone] (fax)

## 2022-09-15 ENCOUNTER — Encounter: Payer: Medicare Other | Admitting: Student

## 2022-09-15 DIAGNOSIS — I442 Atrioventricular block, complete: Secondary | ICD-10-CM

## 2022-09-15 DIAGNOSIS — I472 Ventricular tachycardia, unspecified: Secondary | ICD-10-CM

## 2022-10-03 ENCOUNTER — Ambulatory Visit (INDEPENDENT_AMBULATORY_CARE_PROVIDER_SITE_OTHER): Payer: Medicare Other

## 2022-10-03 DIAGNOSIS — I639 Cerebral infarction, unspecified: Secondary | ICD-10-CM

## 2022-10-03 LAB — CUP PACEART REMOTE DEVICE CHECK
Battery Remaining Longevity: 109 mo
Battery Voltage: 3.01 V
Brady Statistic AP VP Percent: 0 %
Brady Statistic AP VS Percent: 0 %
Brady Statistic AS VP Percent: 99.06 %
Brady Statistic AS VS Percent: 0.94 %
Brady Statistic RA Percent Paced: 0 %
Brady Statistic RV Percent Paced: 98.87 %
Date Time Interrogation Session: 20231017012404
HighPow Impedance: 71 Ohm
Implantable Lead Implant Date: 20080331
Implantable Lead Implant Date: 20230616
Implantable Lead Location: 753859
Implantable Lead Location: 753860
Implantable Lead Model: 5592
Implantable Lead Model: 6935
Implantable Pulse Generator Implant Date: 20230616
Lead Channel Impedance Value: 304 Ohm
Lead Channel Impedance Value: 361 Ohm
Lead Channel Impedance Value: 399 Ohm
Lead Channel Pacing Threshold Amplitude: 0.625 V
Lead Channel Pacing Threshold Pulse Width: 0.4 ms
Lead Channel Sensing Intrinsic Amplitude: 3.625 mV
Lead Channel Sensing Intrinsic Amplitude: 3.625 mV
Lead Channel Sensing Intrinsic Amplitude: 6.75 mV
Lead Channel Sensing Intrinsic Amplitude: 6.75 mV
Lead Channel Setting Pacing Amplitude: 2 V
Lead Channel Setting Pacing Pulse Width: 0.4 ms
Lead Channel Setting Sensing Sensitivity: 0.3 mV

## 2022-10-18 NOTE — Progress Notes (Signed)
Remote ICD transmission.   

## 2022-10-25 IMAGING — CR DG CHEST 2V
2 series · 2 of 2 positions shown · non-contrast
Comparison: 06/01/2022

CLINICAL DATA: ICD placement.

EXAM:
CHEST - 2 VIEW

[chest pa]
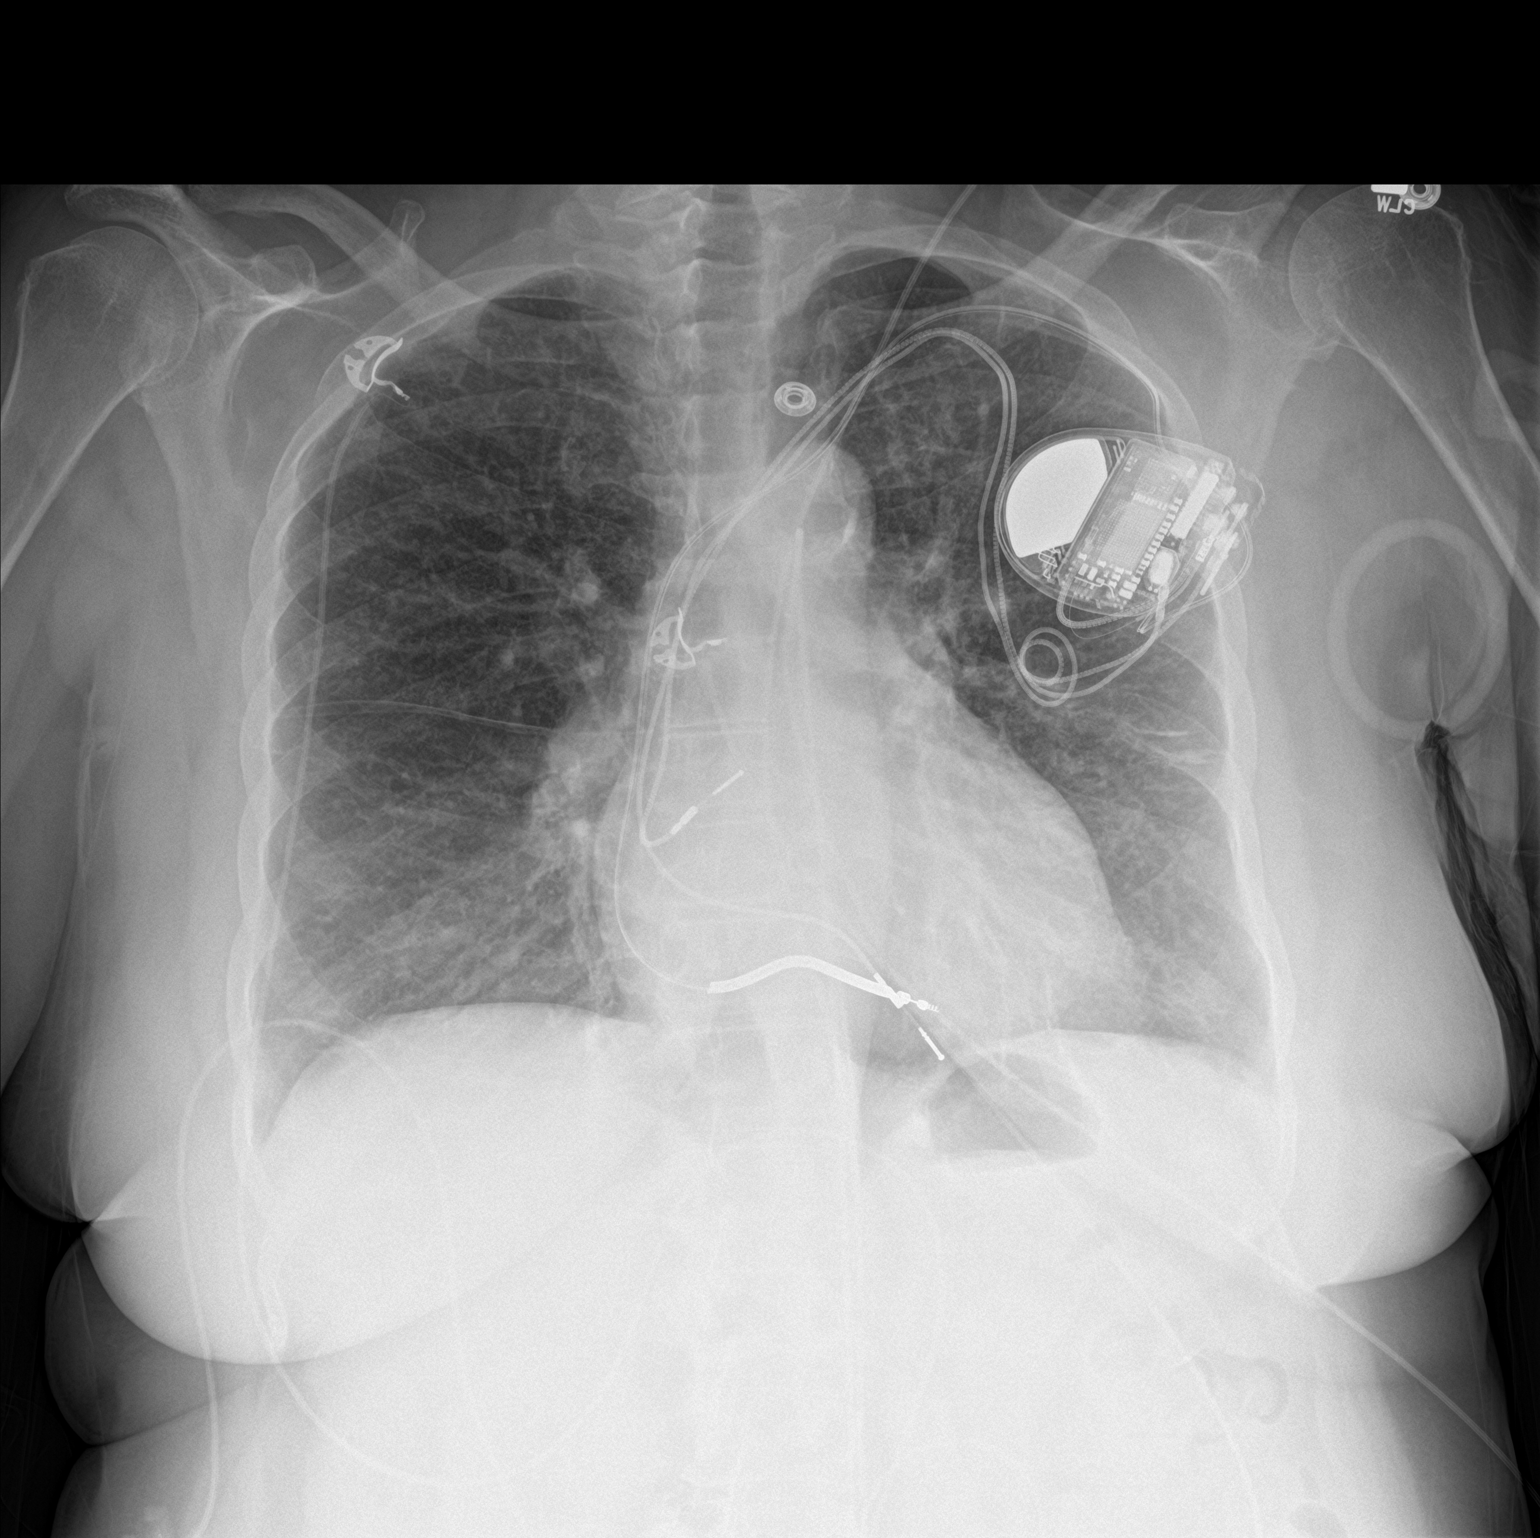

[chest lat]
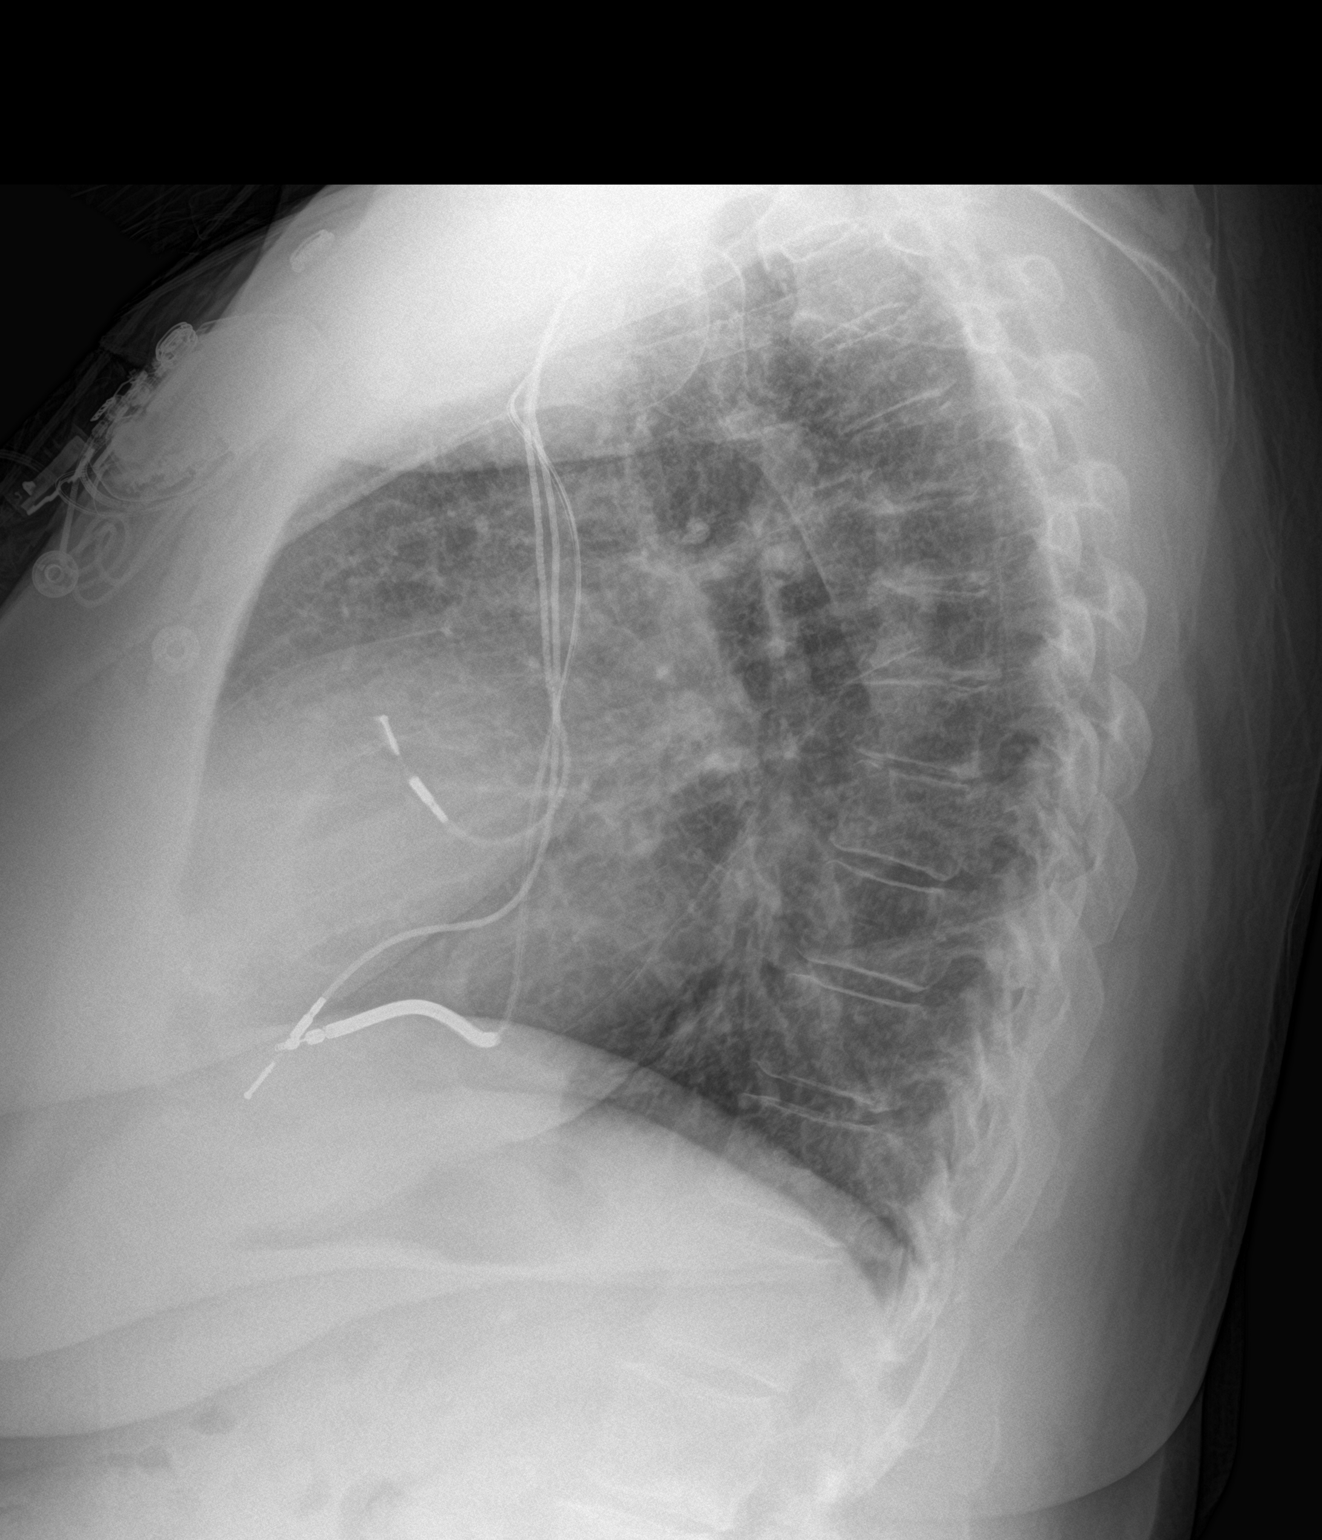

[2 of 2 positions shown; findings below may reference images not displayed]

FINDINGS: Placement of left-sided ICD with atrioventricular leads in adequate
position. Lungs are adequately inflated without focal airspace
consolidation or effusion. No pneumothorax. Cardiomediastinal
silhouette and remainder of the exam is unchanged.
IMPRESSION: 1. No acute cardiopulmonary disease.
2. Left-sided ICD with atrioventricular leads in adequate position.

## 2023-01-02 ENCOUNTER — Ambulatory Visit: Payer: Medicare Other | Attending: Cardiology

## 2023-01-02 DIAGNOSIS — I442 Atrioventricular block, complete: Secondary | ICD-10-CM | POA: Diagnosis not present

## 2023-01-02 LAB — CUP PACEART REMOTE DEVICE CHECK
Battery Remaining Longevity: 105 mo
Battery Voltage: 2.98 V
Brady Statistic AP VP Percent: 0 %
Brady Statistic AP VS Percent: 0 %
Brady Statistic AS VP Percent: 95.62 %
Brady Statistic AS VS Percent: 4.38 %
Brady Statistic RA Percent Paced: 0 %
Brady Statistic RV Percent Paced: 99.16 %
Date Time Interrogation Session: 20240116033423
HighPow Impedance: 69 Ohm
Implantable Lead Connection Status: 753985
Implantable Lead Connection Status: 753985
Implantable Lead Implant Date: 20080331
Implantable Lead Implant Date: 20230616
Implantable Lead Location: 753859
Implantable Lead Location: 753860
Implantable Lead Model: 5592
Implantable Lead Model: 6935
Implantable Pulse Generator Implant Date: 20230616
Lead Channel Impedance Value: 285 Ohm
Lead Channel Impedance Value: 342 Ohm
Lead Channel Impedance Value: 361 Ohm
Lead Channel Pacing Threshold Amplitude: 0.5 V
Lead Channel Pacing Threshold Pulse Width: 0.4 ms
Lead Channel Sensing Intrinsic Amplitude: 4 mV
Lead Channel Sensing Intrinsic Amplitude: 4 mV
Lead Channel Sensing Intrinsic Amplitude: 6.75 mV
Lead Channel Sensing Intrinsic Amplitude: 6.75 mV
Lead Channel Setting Pacing Amplitude: 2 V
Lead Channel Setting Pacing Pulse Width: 0.4 ms
Lead Channel Setting Sensing Sensitivity: 0.3 mV

## 2023-01-26 NOTE — Progress Notes (Signed)
Remote ICD transmission.   

## 2023-03-06 ENCOUNTER — Other Ambulatory Visit: Payer: Self-pay

## 2023-03-06 ENCOUNTER — Emergency Department (HOSPITAL_COMMUNITY)
Admission: EM | Admit: 2023-03-06 | Discharge: 2023-03-06 | Disposition: A | Discharge status: Home / Self Care | Payer: Medicare Other | Attending: Emergency Medicine | Admitting: Emergency Medicine

## 2023-03-06 ENCOUNTER — Encounter (HOSPITAL_COMMUNITY): Payer: Self-pay

## 2023-03-06 ENCOUNTER — Emergency Department (HOSPITAL_COMMUNITY): Payer: Medicare Other

## 2023-03-06 DIAGNOSIS — Z794 Long term (current) use of insulin: Secondary | ICD-10-CM | POA: Diagnosis not present

## 2023-03-06 DIAGNOSIS — I4729 Other ventricular tachycardia: Secondary | ICD-10-CM

## 2023-03-06 DIAGNOSIS — I110 Hypertensive heart disease with heart failure: Secondary | ICD-10-CM | POA: Diagnosis not present

## 2023-03-06 DIAGNOSIS — Z8673 Personal history of transient ischemic attack (TIA), and cerebral infarction without residual deficits: Secondary | ICD-10-CM | POA: Insufficient documentation

## 2023-03-06 DIAGNOSIS — E119 Type 2 diabetes mellitus without complications: Secondary | ICD-10-CM | POA: Diagnosis not present

## 2023-03-06 DIAGNOSIS — I2510 Atherosclerotic heart disease of native coronary artery without angina pectoris: Secondary | ICD-10-CM | POA: Diagnosis not present

## 2023-03-06 DIAGNOSIS — Z950 Presence of cardiac pacemaker: Secondary | ICD-10-CM | POA: Insufficient documentation

## 2023-03-06 DIAGNOSIS — Z79899 Other long term (current) drug therapy: Secondary | ICD-10-CM | POA: Diagnosis not present

## 2023-03-06 DIAGNOSIS — J4541 Moderate persistent asthma with (acute) exacerbation: Secondary | ICD-10-CM | POA: Insufficient documentation

## 2023-03-06 DIAGNOSIS — Z7984 Long term (current) use of oral hypoglycemic drugs: Secondary | ICD-10-CM | POA: Diagnosis not present

## 2023-03-06 DIAGNOSIS — D72829 Elevated white blood cell count, unspecified: Secondary | ICD-10-CM | POA: Insufficient documentation

## 2023-03-06 DIAGNOSIS — I5033 Acute on chronic diastolic (congestive) heart failure: Secondary | ICD-10-CM | POA: Diagnosis not present

## 2023-03-06 DIAGNOSIS — R0602 Shortness of breath: Secondary | ICD-10-CM | POA: Diagnosis present

## 2023-03-06 DIAGNOSIS — Z7901 Long term (current) use of anticoagulants: Secondary | ICD-10-CM | POA: Insufficient documentation

## 2023-03-06 LAB — CBC WITH DIFFERENTIAL/PLATELET
Abs Immature Granulocytes: 0.08 10*3/uL — ABNORMAL HIGH (ref 0.00–0.07)
Basophils Absolute: 0.1 10*3/uL (ref 0.0–0.1)
Basophils Relative: 1 %
Eosinophils Absolute: 0.2 10*3/uL (ref 0.0–0.5)
Eosinophils Relative: 2 %
HCT: 32.5 % — ABNORMAL LOW (ref 36.0–46.0)
Hemoglobin: 10.2 g/dL — ABNORMAL LOW (ref 12.0–15.0)
Immature Granulocytes: 1 %
Lymphocytes Relative: 23 %
Lymphs Abs: 2.5 10*3/uL (ref 0.7–4.0)
MCH: 26 pg (ref 26.0–34.0)
MCHC: 31.4 g/dL (ref 30.0–36.0)
MCV: 82.7 fL (ref 80.0–100.0)
Monocytes Absolute: 0.6 10*3/uL (ref 0.1–1.0)
Monocytes Relative: 6 %
Neutro Abs: 7.4 10*3/uL (ref 1.7–7.7)
Neutrophils Relative %: 67 %
Platelets: 280 10*3/uL (ref 150–400)
RBC: 3.93 MIL/uL (ref 3.87–5.11)
RDW: 15.9 % — ABNORMAL HIGH (ref 11.5–15.5)
WBC: 10.9 10*3/uL — ABNORMAL HIGH (ref 4.0–10.5)
nRBC: 0 % (ref 0.0–0.2)

## 2023-03-06 LAB — COMPREHENSIVE METABOLIC PANEL
ALT: 15 U/L (ref 0–44)
AST: 19 U/L (ref 15–41)
Albumin: 3.6 g/dL (ref 3.5–5.0)
Alkaline Phosphatase: 47 U/L (ref 38–126)
Anion gap: 14 (ref 5–15)
BUN: 23 mg/dL (ref 8–23)
CO2: 21 mmol/L — ABNORMAL LOW (ref 22–32)
Calcium: 9 mg/dL (ref 8.9–10.3)
Chloride: 103 mmol/L (ref 98–111)
Creatinine, Ser: 1.15 mg/dL — ABNORMAL HIGH (ref 0.44–1.00)
GFR, Estimated: 49 mL/min — ABNORMAL LOW (ref >60)
Glucose, Bld: 104 mg/dL — ABNORMAL HIGH (ref 70–99)
Potassium: 3.9 mmol/L (ref 3.5–5.1)
Sodium: 138 mmol/L (ref 135–145)
Total Bilirubin: 0.4 mg/dL (ref 0.3–1.2)
Total Protein: 7.2 g/dL (ref 6.5–8.1)

## 2023-03-06 LAB — TROPONIN I (HIGH SENSITIVITY)
Troponin I (High Sensitivity): 20 ng/L — ABNORMAL HIGH (ref <18)
Troponin I (High Sensitivity): 25 ng/L — ABNORMAL HIGH (ref <18)

## 2023-03-06 LAB — BRAIN NATRIURETIC PEPTIDE: B Natriuretic Peptide: 152.1 pg/mL — ABNORMAL HIGH (ref 0.0–100.0)

## 2023-03-06 MED ORDER — IPRATROPIUM BROMIDE 0.02 % IN SOLN
0.5000 mg | Freq: Once | RESPIRATORY_TRACT | Status: AC
  Administered 2023-03-06: 0.5 mg via RESPIRATORY_TRACT
  Filled 2023-03-06: qty 2.5

## 2023-03-06 MED ORDER — NITROGLYCERIN 2 % TD OINT
1.0000 [in_us] | TOPICAL_OINTMENT | Freq: Once | TRANSDERMAL | Status: AC
  Administered 2023-03-06: 1 [in_us] via TOPICAL
  Filled 2023-03-06: qty 1

## 2023-03-06 MED ORDER — POTASSIUM CHLORIDE CRYS ER 20 MEQ PO TBCR
20.0000 meq | EXTENDED_RELEASE_TABLET | Freq: Once | ORAL | Status: AC
  Administered 2023-03-06: 20 meq via ORAL
  Filled 2023-03-06: qty 1

## 2023-03-06 MED ORDER — FUROSEMIDE 10 MG/ML IJ SOLN
80.0000 mg | Freq: Once | INTRAMUSCULAR | Status: AC
  Administered 2023-03-06: 80 mg via INTRAVENOUS
  Filled 2023-03-06: qty 8

## 2023-03-06 MED ORDER — ALBUTEROL SULFATE (2.5 MG/3ML) 0.083% IN NEBU
5.0000 mg | INHALATION_SOLUTION | Freq: Once | RESPIRATORY_TRACT | Status: AC
  Administered 2023-03-06: 5 mg via RESPIRATORY_TRACT
  Filled 2023-03-06: qty 6

## 2023-03-06 MED ORDER — POTASSIUM CHLORIDE CRYS ER 20 MEQ PO TBCR: 20.0000 meq | EXTENDED_RELEASE_TABLET | Freq: Every day | ORAL | 0 refills | Status: AC

## 2023-03-06 MED ORDER — PREDNISONE 20 MG PO TABS
60.0000 mg | ORAL_TABLET | Freq: Once | ORAL | Status: AC
  Administered 2023-03-06: 60 mg via ORAL
  Filled 2023-03-06: qty 3

## 2023-03-06 MED ORDER — FUROSEMIDE 20 MG PO TABS: 20.0000 mg | ORAL_TABLET | Freq: Every day | ORAL | 0 refills | Status: AC

## 2023-03-06 NOTE — ED Provider Notes (Signed)
Pt signed out by Dr. Christy Gentles.    Pacemaker report:  5 nonsustained VT episodes.  1 today.  No shocks.  Pt d/w Dr. Harrell Gave (cards).  She recommends a dose of oral Kdur prior to d/c as K is on the low side of normal and she received lasix in the ED.  She will let the EP clinic know pt was here and they will arrange f/u.  Pt feels better and is able to ambulate with good O2 sats.  She will be d/c with lasix and kdur.  Return if worse.      Isla Pence, MD 03/06/23 [phone]

## 2023-03-06 NOTE — ED Notes (Signed)
Daughter Rise Paganini Warr [phone] would like an update asap

## 2023-03-06 NOTE — ED Provider Notes (Signed)
[place] Provider Note   CSN: [id number] Arrival date & time: 03/06/23  [id number]     History  Chief Complaint  Patient presents with   Shortness of Breath    Claire Mcgrath is a 77 y.o. female.  The history is provided by the patient.  Patient with history of heart failure, diabetes, CAD, complete heart block with pacemaker presents with chest pain and shortness of breath.  Patient reports she was watching a movie and started having burning and tightness in her chest.  She got up to drink a soda when she started feeling out of breath.  She took an albuterol inhaler with some improvement.  No fevers or vomiting.  No worsening cough.  She is a former smoker. No recent flulike illness.  Patient has ICD in place but no recent shocks No active chest pain at this time No significant lower extremity edema   Past Medical History:  Diagnosis Date   Asthma    CHF (congestive heart failure) (HCC)    Diabetes mellitus without complication ([place])    Heart block    Hyperlipidemia    Hypertension    Stroke [facility])     Home Medications Prior to Admission medications   Medication Sig Start Date End Date Taking? Authorizing Provider  acetaminophen (TYLENOL) 500 MG tablet Take 500 mg by mouth every 6 (six) hours as needed for moderate pain.    [provider]  amLODipine (NORVASC) 2.5 MG tablet Take 2.5 mg by mouth daily. 05/22/22   [provider]  atorvastatin (LIPITOR) 40 MG tablet Take 1 tablet (40 mg total) by mouth daily. 06/23/22   Vickie Epley, MD  ELIQUIS 5 MG TABS tablet Take 5 mg by mouth 2 (two) times daily.  05/15/17   [provider]  fluticasone furoate-vilanterol (BREO ELLIPTA) 100-25 MCG/INH AEPB Inhale 1 puff into the lungs daily as needed (shortness of breath, wheezing).    [provider]  Insulin Lispro Prot & Lispro (HUMALOG 75/25 MIX) (75-25) 100 UNIT/ML Kwikpen Inject 20 Units into the skin  2 (two) times daily. Take 20 units in the am and 10 in the pm    [provider]  lisinopril-hydrochlorothiazide (PRINZIDE,ZESTORETIC) 10-12.5 MG tablet Take 1 tablet by mouth at bedtime.    [provider]  metFORMIN (GLUCOPHAGE) 1000 MG tablet Take 1,000 mg by mouth at bedtime.    [provider]  metoprolol succinate (TOPROL-XL) 25 MG 24 hr tablet Take 25 mg by mouth at bedtime. 05/09/17   [provider]      Allergies    Kiwi extract, Iodine, and Shrimp [shellfish allergy]    Review of Systems   Review of Systems  Constitutional:  Negative for fever.  Respiratory:  Positive for shortness of breath and wheezing.   Cardiovascular:  Positive for chest pain.    Physical Exam Updated Vital Signs BP 118/85   Pulse 65   Temp 98 F (36.7 C) (Oral)   Resp 17   Ht 1.626 m (5\' 4" )   Wt 95.3 kg   SpO2 95%   BMI 36.05 kg/m  Physical Exam CONSTITUTIONAL: Well developed/well nourished, no distress HEAD: Normocephalic/atraumatic EYES: EOMI ENMT: Mucous membranes moist NECK: supple no meningeal signs, + JVD CV: No loud murmurs LUNGS: Wheezing bilaterally ABDOMEN: soft, nontender NEURO: Pt is awake/alert/appropriate, moves all extremitiesx4.  No facial droop.   EXTREMITIES: pulses normal/equal, full ROM SKIN: warm, color normal PSYCH: no abnormalities  of mood noted, alert and oriented to situation  ED Results / Procedures / Treatments   Labs (all labs ordered are listed, but only abnormal results are displayed) Labs Reviewed  CBC WITH DIFFERENTIAL/PLATELET - Abnormal; Notable for the following components:      Result Value   WBC 10.9 (*)    Hemoglobin 10.2 (*)    HCT 32.5 (*)    RDW 15.9 (*)    Abs Immature Granulocytes 0.08 (*)    All other components within normal limits  COMPREHENSIVE METABOLIC PANEL - Abnormal; Notable for the following components:   CO2 21 (*)    Glucose, Bld 104 (*)    Creatinine, Ser 1.15 (*)    GFR, Estimated 49  (*)    All other components within normal limits  BRAIN NATRIURETIC PEPTIDE - Abnormal; Notable for the following components:   B Natriuretic Peptide 152.1 (*)    All other components within normal limits  TROPONIN I (HIGH SENSITIVITY) - Abnormal; Notable for the following components:   Troponin I (High Sensitivity) 20 (*)    All other components within normal limits  TROPONIN I (HIGH SENSITIVITY) - Abnormal; Notable for the following components:   Troponin I (High Sensitivity) 25 (*)    All other components within normal limits    EKG EKG Interpretation  Date/Time:  Tuesday March 06 2023 04:21:24 EDT Ventricular Rate:  60 PR Interval:    QRS Duration: 148 QT Interval:  460 QTC Calculation: 460 R Axis:   -79 Text Interpretation: Ventricular-paced rhythm Abnormal ECG Interpretation limited secondary to artifact Confirmed by Ripley Fraise [id number]) on 03/06/2023 4:48:10 AM  Radiology DG Chest 2 View  Result Date: 03/06/2023 CLINICAL DATA:  77 year old female with history of chest pain and shortness of breath. EXAM: CHEST - 2 VIEW COMPARISON:  Chest x-ray 01/03/2022. FINDINGS: Left-sided pacemaker/AICD noted with lead tips projecting over the expected location of the right atrium and right ventricle. There is cephalization of the pulmonary vasculature and slight indistinctness of the interstitial markings suggestive of mild pulmonary edema. No definite pleural effusions. Mild cardiomegaly with prominence of the left ventricular contour, suggesting left ventricular hypertrophy. Upper mediastinal contours are within normal limits. Atherosclerotic calcifications are noted in the thoracic aorta. IMPRESSION: 1. The appearance of the chest suggests mild congestive heart failure, as above. 2. Cardiomegaly with evidence of left ventricular hypertrophy. 3. Aortic atherosclerosis. Electronically Signed   By: Vinnie Langton M.D.   On: 03/06/2023 05:07    Procedures Procedures    Medications  Ordered in ED Medications  albuterol (PROVENTIL) (2.5 MG/3ML) 0.083% nebulizer solution 5 mg (5 mg Nebulization Given 03/06/23 0557)  ipratropium (ATROVENT) nebulizer solution 0.5 mg (0.5 mg Nebulization Given 03/06/23 0557)  predniSONE (DELTASONE) tablet 60 mg (60 mg Oral Given 03/06/23 0557)  nitroGLYCERIN (NITROGLYN) 2 % ointment 1 inch (1 inch Topical Given 03/06/23 0601)  furosemide (LASIX) injection 80 mg (80 mg Intravenous Given 03/06/23 0604)    ED Course/ Medical Decision Making/ A&P Clinical Course as of 03/06/23 0703  Tue Mar 06, 2023  0540 Patient presents with chest tightness, shortness of breath.  Suspect combination of asthma as well as CHF.  Patient had recent cath that showed mild nonobstructive CAD.  Will focus on alleviating her symptoms [DW]  [phone] Daughter was updated at patient request.  Patient resting comfortably, treatment ongoing [DW]  0703 Signed out to dr Gilford Raid at shift change [DW]    Clinical Course User Index [DW] Ripley Fraise, MD  Medical Decision Making Risk Prescription drug management.   This patient presents to the ED for concern of shortness of breath, this involves an extensive number of treatment options, and is a complaint that carries with it a high risk of complications and morbidity.  The differential diagnosis includes but is not limited to Acute coronary syndrome, pneumonia, acute pulmonary edema, pneumothorax, acute anemia, pulmonary embolism    Comorbidities that complicate the patient evaluation: Patient's presentation is complicated by their history of asthma, CHF  Social Determinants of Health: Patient's  previous smoking history   increases the complexity of managing their presentation  Additional history obtained: Records reviewed previous admission documents  Lab Tests: I Ordered, and personally interpreted labs.  The pertinent results include: Mild leukocytosis  Imaging Studies ordered: I  ordered imaging studies including X-ray chest   I independently visualized and interpreted imaging which showed mild CHF I agree with the radiologist interpretation  Cardiac Monitoring: The patient was maintained on a cardiac monitor.  I personally viewed and interpreted the cardiac monitor which showed an underlying rhythm of:   Paced rhythm  Medicines ordered and prescription drug management: I ordered medication including Lasix, nitroglycerin, albuterol for shortness of breath Reevaluation of the patient after these medicines showed that the patient    improved  Critical Interventions:   nitroglycerin, Lasix  Reevaluation: After the interventions noted above, I reevaluated the patient and found that they have :improved  Complexity of problems addressed: Patient's presentation is most consistent with  acute presentation with potential threat to life or bodily function          Final Clinical Impression(s) / ED Diagnoses Final diagnoses:  Acute on chronic diastolic congestive heart failure ([place])  Moderate persistent asthma with exacerbation    Rx / DC Orders ED Discharge Orders     None         Ripley Fraise, MD 03/06/23 [phone]

## 2023-03-06 NOTE — ED Provider Triage Note (Signed)
Emergency Medicine Provider Triage Evaluation Note  Manica Cassiano Charon , a 77 y.o. female  was evaluated in triage.  Pt complains of chest pain and SOB.  Started while sitting on couch watching a movie.  Used home inhaler and states it feels better now.  Denies recent cough or fever.  Review of Systems  Positive: Chest pain, SOB Negative: fever  Physical Exam  BP (!) 151/57   Pulse 62   Temp 98 F (36.7 C) (Oral)   Resp 18   Ht 5\' 4"  (1.626 m)   Wt 95.3 kg   SpO2 96%   BMI 36.05 kg/m  Gen:   Awake, no distress   Resp:  Normal effort  MSK:   Moves extremities without difficulty  Other:  AICD in place  Medical Decision Making  Medically screening exam initiated at 3:50 AM.  Appropriate orders placed.  Henreitta C Bright was informed that the remainder of the evaluation will be completed by another provider, this initial triage assessment does not replace that evaluation, and the importance of remaining in the ED until their evaluation is complete.  Chest pain, SOB-- resolved now.  EKG, labs, CXR.  Will interrogate AICD.   Larene Pickett, PA-C 03/06/23 [phone]

## 2023-03-06 NOTE — ED Triage Notes (Signed)
Pt arrived by EMS from home complaining of shortness of breath, pt used her inhaler with improvement   On arrival to ED pt is no longer complaining of shortness of breath and denies pain at this time   EMS reports initially high BP 180/100 Pt states that she has been taking her HTN meds without missing any doses.

## 2023-03-06 NOTE — ED Notes (Signed)
Pt as able to ambulate with SPO2 remaining at 96% RA

## 2023-03-13 LAB — LAB REPORT - SCANNED: EGFR: 52

## 2023-03-14 ENCOUNTER — Encounter: Payer: Medicare Other | Admitting: Physician Assistant

## 2023-03-15 NOTE — Progress Notes (Signed)
Cardiology Office Note Date:  03/16/2023  Patient ID:  Claire Mcgrath, Claire Mcgrath 10-13-1946, MRN [id number] PCP:  Jilda Panda, MD  Cardiologist:  historically was at [facility], prefers to keep her care here Electrophysiologist: Dr. Lovena Le     Chief Complaint: over due  History of Present Illness: Claire Mcgrath is a 77 y.o. 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 Gated scintigraphy revealed LVEF 64%. SPECT imaging revealed no evidence for scar or ischemia and referred to EP  She saw Dr. Quentin Ore 06/01/22, planned to admit her via the ER same day and upgrade her system to ICD   She has not seen EP since her wound check visit  ER visit 03/06/23 c/o SOB/CP, suspect COPD/CHF exacerbations treated with inhalers/neb, steroid, and lasix Device interrogation reported NSVT only, normal function of device Discharged from the ER K+ 3.9 BUN/Creat 23/1.15 LFTs ok BNP 152 HS Trop 20, 25 WBC 10.9 H/H 10/32 Plts 280   TODAY She feels great! No CP, palpitations SOB remains resolved She denies any near syncope or syncope. No bleeding or signs of bleeding Wants to keep all her cardiology care here and with Dr. Lovena Le    Device information MDT dual chamber PPM implanted 03/18/2007 > Upgrade to dual chamber ICD 05/31/22 She has an abandoned RV pacing lead   Past Medical History:  Diagnosis Date   Asthma    CHF (congestive heart failure) ([place])    Diabetes mellitus without complication ([place])    Heart block    Hyperlipidemia    Hypertension    Stroke [facility])     Past Surgical History:  Procedure Laterality Date   ABDOMINAL HYSTERECTOMY     BREAST LUMPECTOMY     left   EP IMPLANTABLE DEVICE     ICD IMPLANT N/A 06/02/2022    Procedure: ICD IMPLANT;  Surgeon: Evans Lance, MD;  Location: [place] CV LAB;  Service: Cardiovascular;  Laterality: N/A;   LEFT HEART CATH AND CORONARY ANGIOGRAPHY N/A 06/02/2022   Procedure: LEFT HEART CATH AND CORONARY ANGIOGRAPHY;  Surgeon: Troy Sine, MD;  Location: [place] CV LAB;  Service: Cardiovascular;  Laterality: N/A;   PACEMAKER INSERTION     PPM GENERATOR REMOVAL N/A 06/02/2022   Procedure: PPM GENERATOR REMOVAL;  Surgeon: Evans Lance, MD;  Location: [place] CV LAB;  Service: Cardiovascular;  Laterality: N/A;    Current Outpatient Medications  Medication Sig Dispense Refill   acetaminophen (TYLENOL) 500 MG tablet Take 500 mg by mouth every 6 (six) hours as needed for moderate pain.     amLODipine (NORVASC) 2.5 MG tablet Take 2.5 mg by mouth daily.     atorvastatin (LIPITOR) 40 MG tablet Take 1 tablet (40 mg total) by mouth daily. 90 tablet 3   ELIQUIS 5 MG TABS tablet Take 5 mg by mouth 2 (two) times daily.      fluticasone furoate-vilanterol (BREO ELLIPTA) 100-25 MCG/INH AEPB Inhale 1 puff into the lungs daily as needed (shortness of breath, wheezing).     furosemide (LASIX) 20 MG tablet Take 1 tablet (20 mg total) by mouth daily. 30 tablet 0   Insulin Lispro Prot & Lispro (HUMALOG 75/25 MIX) (75-25) 100 UNIT/ML [employer]  Inject 20 Units into the skin 2 (two) times daily. Take 20 units in the am and 10 in the pm     lisinopril-hydrochlorothiazide (PRINZIDE,ZESTORETIC) 10-12.5 MG tablet Take 1 tablet by mouth at bedtime.     metFORMIN (GLUCOPHAGE) 1000 MG tablet Take 1,000 mg by mouth at bedtime.     metoprolol succinate (TOPROL-XL) 25 MG 24 hr tablet Take 25 mg by mouth at bedtime.     potassium chloride SA (KLOR-CON M) 20 MEQ tablet Take 1 tablet (20 mEq total) by mouth daily. 30 tablet 0   No current facility-administered medications for this visit.    Allergies:   Kiwi extract, Iodine, and Shrimp [shellfish allergy]   Social History:  The patient   reports that she has been smoking cigarettes. She has been smoking an average of .5 packs per day. She has never used smokeless tobacco. She reports current alcohol use. She reports that she does not use drugs.   Family History:  The patient's family history includes Congestive Heart Failure in her mother; Heart attack in her father; Stroke in her maternal aunt.  ROS:  Please see the history of present illness.    All other systems are reviewed and otherwise negative.   PHYSICAL EXAM:  VS:  BP 120/60   Pulse 61   Ht 5\' 4"  (1.626 m)   Wt 198 lb 6.4 oz (90 kg)   SpO2 96%   BMI 34.06 kg/m  BMI: Body mass index is 34.06 kg/m. Well nourished, well developed, in no acute distress HEENT: normocephalic, atraumatic Neck: no JVD, carotid bruits or masses Cardiac:  RRR (Paced); no significant murmurs, no rubs, or gallops Lungs:  CTA b/l, no wheezing, rhonchi or rales Abd: soft, nontender MS: no deformity or atrophy Ext: no edema Skin: warm and dry, no rash Neuro:  No gross deficits appreciated Psych: euthymic mood, full affect  ICD site is stable, no tethering or discomfort   EKG:  not done today  Device interrogation done today and reviewed by myself:  Battery and lead measurements are good No VT   06/02/2022: LHC   Ost Cx lesion is 20% stenosed.   Minimal nonobstructive CAD with mild luminal irregularity of the RCA, smooth 20% ostial left circumflex stenosis, and normal-appearing LAD. LVEDP 19 mmHg. Patent left subclavian vein at site of prior pacemaker insertion.   03/27/2022  Nuclear Myoview ([facility]): 1.  Normal left ventricular function  2.  Normal wall motion  3.  No evidence for scar or ischemia   08/29/2021  Echo:  1. Left ventricular ejection fraction, by estimation, is 60 to 65%. The  left ventricle has normal function. The left ventricle has no regional  wall motion abnormalities. There is moderate left ventricular hypertrophy.  Left ventricular diastolic function    could not be evaluated.   2. Right ventricular systolic function is normal. The right ventricular  size is normal. There is normal pulmonary artery systolic pressure. The  estimated right ventricular systolic pressure is 0000000 mmHg.   3. The mitral valve is abnormal. Trivial mitral valve regurgitation.   4. The aortic valve is tricuspid. Aortic valve regurgitation is not  visualized. Mild aortic valve sclerosis is present, with no evidence of  aortic valve stenosis. Aortic valve mean gradient measures 3.0 mmHg.   5. The inferior vena cava is dilated in size with >50% respiratory  variability, suggesting right atrial pressure of 8 mmHg.   Recent Labs: 06/02/2022: Magnesium 1.7 03/06/2023: ALT 15; B Natriuretic Peptide  152.1; BUN 23; Creatinine, Ser 1.15; Hemoglobin 10.2; Platelets 280; Potassium 3.9; Sodium 138  06/03/2022: Cholesterol 122; HDL 46; LDL Cholesterol 62; Total CHOL/HDL Ratio 2.7; Triglycerides 68; VLDL 14   Estimated Creatinine Clearance: 44.5 mL/min (A) (by C-G formula based on SCr of 1.15 mg/dL (H)).   Wt Readings from Last 3 Encounters:  03/16/23 198 lb 6.4 oz (90 kg)  03/06/23 210 lb (95.3 kg)  06/29/22 202 lb 13.2 oz (92 kg)     Other studies reviewed: Additional studies/records reviewed today include: summarized above  ASSESSMENT AND PLAN:  ICD Intact function No programming changes made  VT/VF None noted by today's device ER interrogation recently done, reported one NSVT event   Presumed permanent AFib  All available EKGs are AFib/flutter CHA2DS2Vasc is 7, on Eliquis, appropriately dosed Rate controlled 99.2% VP   Secondary hypercoagulable state   Disposition: F/u with [place] in 4 mo, sooner if needed   Current medicines are reviewed at length with the patient today.  The patient did not have any concerns regarding medicines.  Venetia Night, PA-C 03/16/2023 2:11 PM     [address] [place]  [phone] (office)  [phone] (fax)

## 2023-03-16 ENCOUNTER — Ambulatory Visit: Payer: Medicare Other | Attending: Physician Assistant | Admitting: Physician Assistant

## 2023-03-16 ENCOUNTER — Encounter: Payer: Self-pay | Admitting: Physician Assistant

## 2023-03-16 VITALS — BP 120/60 | HR 61 | Ht 64.0 in | Wt 198.4 lb

## 2023-03-16 DIAGNOSIS — I4901 Ventricular fibrillation: Secondary | ICD-10-CM

## 2023-03-16 DIAGNOSIS — Z95810 Presence of automatic (implantable) cardiac defibrillator: Secondary | ICD-10-CM | POA: Diagnosis not present

## 2023-03-16 DIAGNOSIS — D6869 Other thrombophilia: Secondary | ICD-10-CM

## 2023-03-16 DIAGNOSIS — I472 Ventricular tachycardia, unspecified: Secondary | ICD-10-CM

## 2023-03-16 DIAGNOSIS — I4821 Permanent atrial fibrillation: Secondary | ICD-10-CM | POA: Diagnosis not present

## 2023-03-16 LAB — CUP PACEART INCLINIC DEVICE CHECK
Battery Remaining Longevity: 105 mo
Battery Voltage: 3.01 V
Brady Statistic AP VP Percent: 0 %
Brady Statistic AP VS Percent: 0 %
Brady Statistic AS VP Percent: 100 %
Brady Statistic AS VS Percent: 0 %
Brady Statistic RA Percent Paced: 0 %
Brady Statistic RV Percent Paced: 99.18 %
Date Time Interrogation Session: 20240329140243
HighPow Impedance: 77 Ohm
Implantable Lead Connection Status: 753985
Implantable Lead Connection Status: 753985
Implantable Lead Implant Date: 20080331
Implantable Lead Implant Date: 20230616
Implantable Lead Location: 753859
Implantable Lead Location: 753860
Implantable Lead Model: 5592
Implantable Lead Model: 6935
Implantable Pulse Generator Implant Date: 20230616
Lead Channel Impedance Value: 342 Ohm
Lead Channel Impedance Value: 418 Ohm
Lead Channel Impedance Value: 513 Ohm
Lead Channel Pacing Threshold Amplitude: 0.75 V
Lead Channel Pacing Threshold Pulse Width: 0.4 ms
Lead Channel Sensing Intrinsic Amplitude: 4 mV
Lead Channel Sensing Intrinsic Amplitude: 4 mV
Lead Channel Sensing Intrinsic Amplitude: 6.5 mV
Lead Channel Sensing Intrinsic Amplitude: 6.5 mV
Lead Channel Setting Pacing Amplitude: 2 V
Lead Channel Setting Pacing Pulse Width: 0.4 ms
Lead Channel Setting Sensing Sensitivity: 0.3 mV

## 2023-03-16 NOTE — Patient Instructions (Signed)
Medication Instructions:   Your physician recommends that you continue on your current medications as directed. Please refer to the Current Medication list given to you today.   *If you need a refill on your cardiac medications before your next appointment, please call your pharmacy*   Lab Work:  [place]    If you have labs (blood work) drawn today and your tests are completely normal, you will receive your results only by: [place] (if you have MyChart) OR A paper copy in the mail If you have any lab test that is abnormal or we need to change your treatment, we will call you to review the results.    Testing/Procedures:   NONE ORDERED  TODAY     Follow-Up: At [facility], you and your health needs are our priority.  As part of our continuing mission to provide you with exceptional heart care, we have created designated Provider Care Teams.  These Care Teams include your primary Cardiologist (physician) and Advanced Practice Providers (APPs -  Physician Assistants and Nurse Practitioners) who all work together to provide you with the care you need, when you need it.  We recommend signing up for the patient portal called "MyChart".  Sign up information is provided on this After Visit Summary.  MyChart is used to connect with patients for Virtual Visits (Telemedicine).  Patients are able to view lab/test results, encounter notes, upcoming appointments, etc.  Non-urgent messages can be sent to your provider as well.   To learn more about what you can do with MyChart, go to [email or web address].    Your next appointment:   4 month(s)  Provider:   You may see Dr. Lovena Le  or one of the following Advanced Practice Providers on your designated Care Team:   Tommye Standard, [place]   Other Instructions

## 2023-04-03 ENCOUNTER — Ambulatory Visit (INDEPENDENT_AMBULATORY_CARE_PROVIDER_SITE_OTHER): Payer: Medicare Other

## 2023-04-03 DIAGNOSIS — I442 Atrioventricular block, complete: Secondary | ICD-10-CM | POA: Diagnosis not present

## 2023-04-04 LAB — CUP PACEART REMOTE DEVICE CHECK
Battery Remaining Longevity: 102 mo
Battery Voltage: 2.96 V
Brady Statistic AP VP Percent: 0 %
Brady Statistic AP VS Percent: 0 %
Brady Statistic AS VP Percent: 98.94 %
Brady Statistic AS VS Percent: 1.06 %
Brady Statistic RA Percent Paced: 0 %
Brady Statistic RV Percent Paced: 99.1 %
Date Time Interrogation Session: 20240416044223
HighPow Impedance: 71 Ohm
Implantable Lead Connection Status: 753985
Implantable Lead Connection Status: 753985
Implantable Lead Implant Date: 20080331
Implantable Lead Implant Date: 20230616
Implantable Lead Location: 753859
Implantable Lead Location: 753860
Implantable Lead Model: 5592
Implantable Lead Model: 6935
Implantable Pulse Generator Implant Date: 20230616
Lead Channel Impedance Value: 304 Ohm
Lead Channel Impedance Value: 361 Ohm
Lead Channel Impedance Value: 513 Ohm
Lead Channel Pacing Threshold Amplitude: 0.625 V
Lead Channel Pacing Threshold Pulse Width: 0.4 ms
Lead Channel Sensing Intrinsic Amplitude: 4 mV
Lead Channel Sensing Intrinsic Amplitude: 4 mV
Lead Channel Sensing Intrinsic Amplitude: 7.5 mV
Lead Channel Sensing Intrinsic Amplitude: 7.5 mV
Lead Channel Setting Pacing Amplitude: 2 V
Lead Channel Setting Pacing Pulse Width: 0.4 ms
Lead Channel Setting Sensing Sensitivity: 0.3 mV

## 2023-05-07 NOTE — Progress Notes (Signed)
Remote ICD transmission.   

## 2023-06-19 ENCOUNTER — Other Ambulatory Visit: Payer: Self-pay | Admitting: Cardiology

## 2023-07-03 ENCOUNTER — Ambulatory Visit: Payer: Medicare Other

## 2023-07-03 DIAGNOSIS — I442 Atrioventricular block, complete: Secondary | ICD-10-CM | POA: Diagnosis not present

## 2023-07-05 LAB — CUP PACEART REMOTE DEVICE CHECK
Battery Remaining Longevity: 100 mo
Battery Voltage: 2.93 V
Brady Statistic AP VP Percent: 0 %
Brady Statistic AP VS Percent: 0 %
Brady Statistic AS VP Percent: 95.62 %
Brady Statistic AS VS Percent: 4.38 %
Brady Statistic RA Percent Paced: 0 %
Brady Statistic RV Percent Paced: 96.03 %
Date Time Interrogation Session: 20240716022722
HighPow Impedance: 71 Ohm
Implantable Lead Connection Status: 753985
Implantable Lead Connection Status: 753985
Implantable Lead Implant Date: 20080331
Implantable Lead Implant Date: 20230616
Implantable Lead Location: 753859
Implantable Lead Location: 753860
Implantable Lead Model: 5592
Implantable Lead Model: 6935
Implantable Pulse Generator Implant Date: 20230616
Lead Channel Impedance Value: 304 Ohm
Lead Channel Impedance Value: 361 Ohm
Lead Channel Impedance Value: 513 Ohm
Lead Channel Pacing Threshold Amplitude: 0.625 V
Lead Channel Pacing Threshold Pulse Width: 0.4 ms
Lead Channel Sensing Intrinsic Amplitude: 4 mV
Lead Channel Sensing Intrinsic Amplitude: 4 mV
Lead Channel Sensing Intrinsic Amplitude: 6.875 mV
Lead Channel Sensing Intrinsic Amplitude: 6.875 mV
Lead Channel Setting Pacing Amplitude: 2 V
Lead Channel Setting Pacing Pulse Width: 0.4 ms
Lead Channel Setting Sensing Sensitivity: 0.3 mV

## 2023-07-17 ENCOUNTER — Ambulatory Visit: Payer: Medicare Other | Attending: Internal Medicine | Admitting: Internal Medicine

## 2023-07-17 ENCOUNTER — Encounter: Payer: Self-pay | Admitting: Internal Medicine

## 2023-07-17 VITALS — BP 122/58 | HR 83 | Ht 63.0 in | Wt 198.0 lb

## 2023-07-17 DIAGNOSIS — I472 Ventricular tachycardia, unspecified: Secondary | ICD-10-CM | POA: Diagnosis not present

## 2023-07-17 LAB — CUP PACEART INCLINIC DEVICE CHECK
Battery Remaining Longevity: 101 mo
Battery Voltage: 3.01 V
Brady Statistic AP VP Percent: 0 %
Brady Statistic AP VS Percent: 0 %
Brady Statistic AS VP Percent: 96.52 %
Brady Statistic AS VS Percent: 3.48 %
Brady Statistic RA Percent Paced: 0 %
Brady Statistic RV Percent Paced: 96.79 %
Date Time Interrogation Session: 20240730202144
HighPow Impedance: 73 Ohm
Implantable Lead Connection Status: 753985
Implantable Lead Connection Status: 753985
Implantable Lead Implant Date: 20080331
Implantable Lead Implant Date: 20230616
Implantable Lead Location: 753859
Implantable Lead Location: 753860
Implantable Lead Model: 5592
Implantable Lead Model: 6935
Implantable Pulse Generator Implant Date: 20230616
Lead Channel Impedance Value: 304 Ohm
Lead Channel Impedance Value: 399 Ohm
Lead Channel Impedance Value: 456 Ohm
Lead Channel Pacing Threshold Amplitude: 0.625 V
Lead Channel Pacing Threshold Amplitude: 0.75 V
Lead Channel Pacing Threshold Pulse Width: 0.4 ms
Lead Channel Pacing Threshold Pulse Width: 0.4 ms
Lead Channel Sensing Intrinsic Amplitude: 4 mV
Lead Channel Sensing Intrinsic Amplitude: 4 mV
Lead Channel Sensing Intrinsic Amplitude: 8.375 mV
Lead Channel Sensing Intrinsic Amplitude: 8.375 mV
Lead Channel Setting Pacing Amplitude: 2 V
Lead Channel Setting Pacing Pulse Width: 0.4 ms
Lead Channel Setting Sensing Sensitivity: 0.3 mV

## 2023-07-17 NOTE — Patient Instructions (Addendum)
Medication Instructions:  Your physician recommends that you continue on your current medications as directed. Please refer to the Current Medication list given to you today.  *If you need a refill on your cardiac medications before your next appointment, please call your pharmacy*  Follow-Up: At  [facility], you and your health needs are our priority.  As part of our continuing mission to provide you with exceptional heart care, we have created designated Provider Care Teams.  These Care Teams include your primary Cardiologist (physician) and Advanced Practice Providers (APPs -  Physician Assistants and Nurse Practitioners) who all work together to provide you with the care you need, when you need it.  Your next appointment:   1 year  Provider:   You may see Gregg Taylor, MD or one of the following Advanced Practice Providers on your designated Care Team:   Renee Ursuy, PA-C Michael "Andy" Tillery, PA-C Suzann Riddle, NP Brandi Ollis, NP    

## 2023-07-17 NOTE — Progress Notes (Signed)
HPI Claire Mcgrath returns today for followup. 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 She has done well in the interim with no chest pain or sob. She is frustrated by her inability to lose weight. She did stop smoking. "I like to eat."No ICD shocks since her implant.  Allergies  Allergen Reactions   Kiwi Extract Anaphylaxis    Feel my throat close up   Iodine Itching and Swelling        Shrimp [Shellfish Allergy] Itching and Swelling     Current Outpatient Medications  Medication Sig Dispense Refill   acetaminophen (TYLENOL) 500 MG tablet Take 500 mg by mouth every 6 (six) hours as needed for moderate pain.     amLODipine (NORVASC) 2.5 MG tablet Take 2.5 mg by mouth daily.     atorvastatin (LIPITOR) 40 MG tablet Take 1 tablet by mouth once daily 90 tablet 2   ELIQUIS 5 MG TABS tablet Take 5 mg by mouth 2 (two) times daily.      fluticasone furoate-vilanterol (BREO ELLIPTA) 100-25 MCG/INH AEPB Inhale 1 puff into the lungs daily as needed (shortness of breath, wheezing).     furosemide (LASIX) 20 MG tablet Take 1 tablet (20 mg total) by mouth daily. 30 tablet 0   Insulin Lispro Prot & Lispro (HUMALOG 75/25 MIX) (75-25) 100 UNIT/ML Kwikpen Inject 20 Units into the skin 2 (two) times daily. Take 20 units in the am and 10 in the pm     lisinopril-hydrochlorothiazide (PRINZIDE,ZESTORETIC) 10-12.5 MG tablet Take 1 tablet by mouth at bedtime.     metFORMIN (GLUCOPHAGE) 1000 MG tablet Take 1,000 mg by mouth at bedtime.     metoprolol succinate (TOPROL-XL) 25 MG 24 hr tablet Take 25 mg by mouth at bedtime.     potassium chloride SA (KLOR-CON M) 20 MEQ tablet Take 1 tablet (20 mEq total) by mouth daily. 30 tablet 0   No current facility-administered medications for this visit.     Past Medical History:  Diagnosis Date   Asthma    CHF (congestive heart failure) (HCC)    Diabetes mellitus without complication (HCC)     Heart block    Hyperlipidemia    Hypertension    Stroke (HCC)     ROS:   All systems reviewed and negative except as noted in the HPI.   Past Surgical History:  Procedure Laterality Date   ABDOMINAL HYSTERECTOMY     BREAST LUMPECTOMY     left   EP IMPLANTABLE DEVICE     ICD IMPLANT N/A 06/02/2022   Procedure: ICD IMPLANT;  Surgeon: Marinus Maw, MD;  Location: [facility] INVASIVE CV LAB;  Service: Cardiovascular;  Laterality: N/A;   LEFT HEART CATH AND CORONARY ANGIOGRAPHY N/A 06/02/2022   Procedure: LEFT HEART CATH AND CORONARY ANGIOGRAPHY;  Surgeon: Lennette Bihari, MD;  Location: [facility] INVASIVE CV LAB;  Service: Cardiovascular;  Laterality: N/A;   PACEMAKER INSERTION     PPM GENERATOR REMOVAL N/A 06/02/2022   Procedure: PPM GENERATOR REMOVAL;  Surgeon: Marinus Maw, MD;  Location: [facility] INVASIVE CV LAB;  Service: Cardiovascular;  Laterality: N/A;     Family History  Problem Relation Age of Onset   Congestive Heart Failure Mother    Heart attack Father    Stroke Maternal Aunt      Social History   Socioeconomic History   Marital status: Married  Spouse name: Not on file   Number of children: Not on file   Years of education: Not on file   Highest education level: Not on file  Occupational History   Not on file  Tobacco Use   Smoking status: Every Day    Current packs/day: 0.50    Types: Cigarettes   Smokeless tobacco: Never  Substance and Sexual Activity   Alcohol use: Yes    Comment: occassional   Drug use: No   Sexual activity: Not on file  Other Topics Concern   Not on file  Social History Narrative   Not on file   Social Determinants of Health   Financial Resource Strain: Not on file  Food Insecurity: Not on file  Transportation Needs: Not on file  Physical Activity: Not on file  Stress: Not on file  Social Connections: Unknown (04/24/2022)   Received from [facility]   Social Network    Social Network: Not on file  Intimate Partner Violence:  Unknown (03/24/2022)   Received from [facility]   HITS    Physically Hurt: Not on file    Insult or Talk Down To: Not on file    Threaten Physical Harm: Not on file    Scream or Curse: Not on file     BP (!) 122/58   Pulse 83   Ht 5\' 3"  (1.6 m)   Wt 198 lb (89.8 kg)   SpO2 97%   BMI 35.07 kg/m   Physical Exam:  Well appearing NAD HEENT: Unremarkable Neck:  No JVD, no thyromegally Lymphatics:  No adenopathy Back:  No CVA tenderness Lungs:  Clear with no wheezes HEART:  Regular rate rhythm, no murmurs, no rubs, no clicks Abd:  soft, positive bowel sounds, no organomegally, no rebound, no guarding Ext:  2 plus pulses, no edema, no cyanosis, no clubbing Skin:  No rashes no nodules Neuro:  CN II through XII intact, motor grossly intact  EKG -   DEVICE  Normal device function.  See PaceArt for details.   Assess/Plan: VT/VF - she is doing well after ICD upgrade. CHB - she is  Afib/flutter ICD

## 2023-07-20 NOTE — Progress Notes (Signed)
Remote ICD transmission.   

## 2023-10-02 ENCOUNTER — Ambulatory Visit (INDEPENDENT_AMBULATORY_CARE_PROVIDER_SITE_OTHER): Payer: Medicare Other

## 2023-10-02 DIAGNOSIS — I472 Ventricular tachycardia, unspecified: Secondary | ICD-10-CM

## 2023-10-03 LAB — CUP PACEART REMOTE DEVICE CHECK
Battery Remaining Longevity: 97 mo
Battery Voltage: 3.01 V
Brady Statistic AP VP Percent: 0 %
Brady Statistic AP VS Percent: 0 %
Brady Statistic AS VP Percent: 95.07 %
Brady Statistic AS VS Percent: 4.93 %
Brady Statistic RA Percent Paced: 0 %
Brady Statistic RV Percent Paced: 95.78 %
Date Time Interrogation Session: 20241015012404
HighPow Impedance: 71 Ohm
Implantable Lead Connection Status: 753985
Implantable Lead Connection Status: 753985
Implantable Lead Implant Date: 20080331
Implantable Lead Implant Date: 20230616
Implantable Lead Location: 753859
Implantable Lead Location: 753860
Implantable Lead Model: 5592
Implantable Lead Model: 6935
Implantable Pulse Generator Implant Date: 20230616
Lead Channel Impedance Value: 285 Ohm
Lead Channel Impedance Value: 361 Ohm
Lead Channel Impedance Value: 456 Ohm
Lead Channel Pacing Threshold Amplitude: 0.75 V
Lead Channel Pacing Threshold Pulse Width: 0.4 ms
Lead Channel Sensing Intrinsic Amplitude: 4 mV
Lead Channel Sensing Intrinsic Amplitude: 4 mV
Lead Channel Sensing Intrinsic Amplitude: 6.75 mV
Lead Channel Sensing Intrinsic Amplitude: 6.75 mV
Lead Channel Setting Pacing Amplitude: 2 V
Lead Channel Setting Pacing Pulse Width: 0.4 ms
Lead Channel Setting Sensing Sensitivity: 0.3 mV

## 2023-10-22 NOTE — Progress Notes (Signed)
Remote ICD transmission.   

## 2024-01-01 ENCOUNTER — Ambulatory Visit (INDEPENDENT_AMBULATORY_CARE_PROVIDER_SITE_OTHER): Payer: Medicare Other

## 2024-01-01 DIAGNOSIS — I472 Ventricular tachycardia, unspecified: Secondary | ICD-10-CM | POA: Diagnosis not present

## 2024-01-01 LAB — CUP PACEART REMOTE DEVICE CHECK
Battery Remaining Longevity: 94 mo
Battery Voltage: 3.01 V
Brady Statistic AP VP Percent: 0 %
Brady Statistic AP VS Percent: 0 %
Brady Statistic AS VP Percent: 95.6 %
Brady Statistic AS VS Percent: 4.4 %
Brady Statistic RA Percent Paced: 0 %
Brady Statistic RV Percent Paced: 95.66 %
Date Time Interrogation Session: 20250114043723
HighPow Impedance: 73 Ohm
Implantable Lead Connection Status: 753985
Implantable Lead Connection Status: 753985
Implantable Lead Implant Date: 20080331
Implantable Lead Implant Date: 20230616
Implantable Lead Location: 753859
Implantable Lead Location: 753860
Implantable Lead Model: 5592
Implantable Lead Model: 6935
Implantable Pulse Generator Implant Date: 20230616
Lead Channel Impedance Value: 304 Ohm
Lead Channel Impedance Value: 361 Ohm
Lead Channel Impedance Value: 456 Ohm
Lead Channel Pacing Threshold Amplitude: 0.625 V
Lead Channel Pacing Threshold Pulse Width: 0.4 ms
Lead Channel Sensing Intrinsic Amplitude: 4 mV
Lead Channel Sensing Intrinsic Amplitude: 4 mV
Lead Channel Sensing Intrinsic Amplitude: 6.5 mV
Lead Channel Sensing Intrinsic Amplitude: 6.5 mV
Lead Channel Setting Pacing Amplitude: 2 V
Lead Channel Setting Pacing Pulse Width: 0.4 ms
Lead Channel Setting Sensing Sensitivity: 0.3 mV

## 2024-02-12 NOTE — Progress Notes (Signed)
 Remote ICD transmission.

## 2024-04-01 ENCOUNTER — Ambulatory Visit: Payer: Medicare Other

## 2024-04-01 DIAGNOSIS — I442 Atrioventricular block, complete: Secondary | ICD-10-CM

## 2024-04-03 LAB — CUP PACEART REMOTE DEVICE CHECK
Battery Remaining Longevity: 91 mo
Battery Voltage: 3 V
Brady Statistic AP VP Percent: 0 %
Brady Statistic AP VS Percent: 0 %
Brady Statistic AS VP Percent: 99.45 %
Brady Statistic AS VS Percent: 0.55 %
Brady Statistic RA Percent Paced: 0 %
Brady Statistic RV Percent Paced: 99.56 %
Date Time Interrogation Session: 20250415012304
HighPow Impedance: 73 Ohm
Implantable Lead Connection Status: 753985
Implantable Lead Connection Status: 753985
Implantable Lead Implant Date: 20080331
Implantable Lead Implant Date: 20230616
Implantable Lead Location: 753859
Implantable Lead Location: 753860
Implantable Lead Model: 5592
Implantable Lead Model: 6935
Implantable Pulse Generator Implant Date: 20230616
Lead Channel Impedance Value: 285 Ohm
Lead Channel Impedance Value: 361 Ohm
Lead Channel Impedance Value: 456 Ohm
Lead Channel Pacing Threshold Amplitude: 0.625 V
Lead Channel Pacing Threshold Pulse Width: 0.4 ms
Lead Channel Sensing Intrinsic Amplitude: 4 mV
Lead Channel Sensing Intrinsic Amplitude: 4 mV
Lead Channel Sensing Intrinsic Amplitude: 7.625 mV
Lead Channel Sensing Intrinsic Amplitude: 7.625 mV
Lead Channel Setting Pacing Amplitude: 2 V
Lead Channel Setting Pacing Pulse Width: 0.4 ms
Lead Channel Setting Sensing Sensitivity: 0.3 mV

## 2024-05-16 NOTE — Addendum Note (Signed)
 Addended by: Lott Rouleau A on: 05/16/2024 12:41 PM   Modules accepted: Orders

## 2024-05-16 NOTE — Progress Notes (Signed)
 Remote ICD transmission.

## 2024-07-01 ENCOUNTER — Ambulatory Visit: Payer: Medicare Other

## 2024-07-01 DIAGNOSIS — I442 Atrioventricular block, complete: Secondary | ICD-10-CM | POA: Diagnosis not present

## 2024-07-02 LAB — CUP PACEART REMOTE DEVICE CHECK
Battery Remaining Longevity: 87 mo
Battery Voltage: 3 V
Brady Statistic AP VP Percent: 0 %
Brady Statistic AP VS Percent: 0 %
Brady Statistic AS VP Percent: 99.45 %
Brady Statistic AS VS Percent: 0.55 %
Brady Statistic RA Percent Paced: 0 %
Brady Statistic RV Percent Paced: 99.44 %
Date Time Interrogation Session: 20250715043723
HighPow Impedance: 75 Ohm
Implantable Lead Connection Status: 753985
Implantable Lead Connection Status: 753985
Implantable Lead Implant Date: 20080331
Implantable Lead Implant Date: 20230616
Implantable Lead Location: 753859
Implantable Lead Location: 753860
Implantable Lead Model: 5592
Implantable Lead Model: 6935
Implantable Pulse Generator Implant Date: 20230616
Lead Channel Impedance Value: 285 Ohm
Lead Channel Impedance Value: 342 Ohm
Lead Channel Impedance Value: 456 Ohm
Lead Channel Pacing Threshold Amplitude: 0.625 V
Lead Channel Pacing Threshold Pulse Width: 0.4 ms
Lead Channel Sensing Intrinsic Amplitude: 4 mV
Lead Channel Sensing Intrinsic Amplitude: 4 mV
Lead Channel Sensing Intrinsic Amplitude: 8.375 mV
Lead Channel Sensing Intrinsic Amplitude: 8.375 mV
Lead Channel Setting Pacing Amplitude: 2 V
Lead Channel Setting Pacing Pulse Width: 0.4 ms
Lead Channel Setting Sensing Sensitivity: 0.3 mV

## 2024-07-07 ENCOUNTER — Ambulatory Visit: Payer: Self-pay | Admitting: Internal Medicine

## 2024-09-24 NOTE — Progress Notes (Signed)
 Remote ICD Transmission

## 2024-09-30 ENCOUNTER — Ambulatory Visit: Payer: Medicare Other

## 2024-09-30 DIAGNOSIS — I442 Atrioventricular block, complete: Secondary | ICD-10-CM | POA: Diagnosis not present

## 2024-10-01 LAB — CUP PACEART REMOTE DEVICE CHECK
Battery Remaining Longevity: 84 mo
Battery Voltage: 3 V
Brady Statistic AP VP Percent: 0 %
Brady Statistic AP VS Percent: 0 %
Brady Statistic AS VP Percent: 99.5 %
Brady Statistic AS VS Percent: 0.5 %
Brady Statistic RA Percent Paced: 0 %
Brady Statistic RV Percent Paced: 99.26 %
Date Time Interrogation Session: 20251014012503
HighPow Impedance: 74 Ohm
Implantable Lead Connection Status: 753985
Implantable Lead Connection Status: 753985
Implantable Lead Implant Date: 20080331
Implantable Lead Implant Date: 20230616
Implantable Lead Location: 753859
Implantable Lead Location: 753860
Implantable Lead Model: 5592
Implantable Lead Model: 6935
Implantable Pulse Generator Implant Date: 20230616
Lead Channel Impedance Value: 285 Ohm
Lead Channel Impedance Value: 361 Ohm
Lead Channel Impedance Value: 456 Ohm
Lead Channel Pacing Threshold Amplitude: 0.625 V
Lead Channel Pacing Threshold Pulse Width: 0.4 ms
Lead Channel Sensing Intrinsic Amplitude: 4 mV
Lead Channel Sensing Intrinsic Amplitude: 4 mV
Lead Channel Sensing Intrinsic Amplitude: 6.875 mV
Lead Channel Sensing Intrinsic Amplitude: 6.875 mV
Lead Channel Setting Pacing Amplitude: 2 V
Lead Channel Setting Pacing Pulse Width: 0.4 ms
Lead Channel Setting Sensing Sensitivity: 0.3 mV

## 2024-10-02 ENCOUNTER — Ambulatory Visit: Payer: Self-pay | Admitting: Internal Medicine

## 2024-10-03 NOTE — Progress Notes (Signed)
 Remote ICD Transmission

## 2024-12-30 ENCOUNTER — Ambulatory Visit: Payer: Medicare Other

## 2024-12-30 DIAGNOSIS — I442 Atrioventricular block, complete: Secondary | ICD-10-CM | POA: Diagnosis not present

## 2024-12-31 ENCOUNTER — Institutional Professional Consult (permissible substitution): Admitting: Neurology

## 2024-12-31 ENCOUNTER — Encounter: Payer: Self-pay | Admitting: Neurology

## 2024-12-31 LAB — CUP PACEART REMOTE DEVICE CHECK
Battery Remaining Longevity: 81 mo
Battery Voltage: 2.99 V
Brady Statistic AP VP Percent: 0 %
Brady Statistic AP VS Percent: 0 %
Brady Statistic AS VP Percent: 96.45 %
Brady Statistic AS VS Percent: 3.55 %
Brady Statistic RA Percent Paced: 0 %
Brady Statistic RV Percent Paced: 99.02 %
Date Time Interrogation Session: 20260113033322
HighPow Impedance: 75 Ohm
Implantable Lead Connection Status: 753985
Implantable Lead Connection Status: 753985
Implantable Lead Implant Date: 20080331
Implantable Lead Implant Date: 20230616
Implantable Lead Location: 753859
Implantable Lead Location: 753860
Implantable Lead Model: 5592
Implantable Lead Model: 6935
Implantable Pulse Generator Implant Date: 20230616
Lead Channel Impedance Value: 285 Ohm
Lead Channel Impedance Value: 342 Ohm
Lead Channel Impedance Value: 456 Ohm
Lead Channel Pacing Threshold Amplitude: 0.625 V
Lead Channel Pacing Threshold Pulse Width: 0.4 ms
Lead Channel Sensing Intrinsic Amplitude: 4 mV
Lead Channel Sensing Intrinsic Amplitude: 4 mV
Lead Channel Sensing Intrinsic Amplitude: 8 mV
Lead Channel Sensing Intrinsic Amplitude: 8 mV
Lead Channel Setting Pacing Amplitude: 2 V
Lead Channel Setting Pacing Pulse Width: 0.4 ms
Lead Channel Setting Sensing Sensitivity: 0.3 mV

## 2024-12-31 NOTE — Progress Notes (Unsigned)
 " VISIT  NO SHOW - 12-31-2024,   This Patient, formerly of Dr Rosemarie,  is referred by PCP for sleep consultation-  with the stated goal of apnea evaluation,  referral lists OSA as a diagnosis, but there is no sleep study on record, no DME orders on record here.  Reportedly had been on CPAP ( started by ??, when ??)    CHF, diastolic , persistent atrial fib.( Eloquis) . ICD in place.  Had hemoptysis and was referred to Dr Rollin GI, 2025.    History of HTN, poorly controlled. HTN on  lasix , HCTZ. Metoprolol  ( also for rate control ? In ICD patient? ) Lisinopril  and amlodipine .  History of stroke ( ED note ) . DM on insulin , On Breo inhaler for asthma ( beta blocker ) ? COPD?  Former smoker.  CHF fluid overload treated with lasix - n potassium supplement, related wheezing.     ED visit in May 2024 Zamaria Brazzle Stach is a 79 y.o. female.   The history is provided by the patient.  Patient with history of heart failure, diabetes, CAD, complete heart block with pacemaker presents with chest pain and shortness of breath.  Patient reports she was watching a movie and started having burning and tightness in her chest.  She got up to drink a soda when she started feeling out of breath.  She took an albuterol  inhaler with some improvement.  No fevers or vomiting.  No worsening cough.  She is a former smoker. No recent flulike illness.   Patient has ICD in place but no recent shocks No active chest pain at this time No significant lower extremity edema     Past Medical History:  Diagnosis Date   Asthma     CHF (congestive heart failure) (HCC)     Diabetes mellitus without complication (HCC)     Heart block     Hyperlipidemia     Hypertension     Stroke [facility])          Dr Rosemarie, MD : CVA  Initial visit 11/06/2016 ;Ms Derringer is a 65 year Caucasian lady seen today for first office follow-up visit following hospital admission for stroke in September 2017. Zayla Agar Nardelli is an 79 y.o. female history  diabetes mellitus, hypertension, hyperlipidemia, asthma and CHF, presenting with weakness involving lower extremities, right greater than left as well as right upper extremity weakness. Lower extremity symptoms were first noticed 2 days ago. She noticed she had particular problems with her right lower extremity and to a lesser extent left upper extremity. One day prior to admission she noticed weakness of her right lower extremity with dropping objects as well as difficulty with writing. She had no change in speech. No facial droop is been reported. She has no previous history of stroke nor TIA. She takes aspirin  daily. 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 She was LKW on 09/02/2016, time unknown. Patient was not administered IV t-PA secondary to  Minimal deficits; beyond time window for treatment consideration. She was admitted  for further evaluation and treatment. She had a follow-up repeat CT scan the next day which did not show significant change in the infarct appearance or any additional infarct. CT angiogram of the brain and neck were both performed did not show significant large vessel stenosis or occlusion. LDL cholesterol was 77 mg percent and hemoglobin A1c was elevated at 9.1. Transthoracic echo showed normal ejection fraction without cardiac source of  embolism. Patient was seen by physical and occupation therapy and aspirin  was changed to Plavix . She stretches tolerating Plavix  well without bleeding or bruising but feels her taste is altered. This seems to have improved slightly off late. She is getting physical and occupation therapy that she has recently finished. Her walking and balance are much better. She still feels at times not sure of her balance but she has had no falls or injuries. She states her blood pressure is well controlled and today it is 119/53. She is working on better sugar control with a primary care physician. She does have some  numbness in the right hand but she feels this is old from carpal tunnel. She has no new complaints today. She is eating healthy and has lost 10 pounds. She plans to join the [facility] soon and be more active. 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 CT scan of the head showed no acute abnormality. Her blood pressure was elevated during this visit. Should she describes no recurrent vertigo episodes.  Update 05/21/2017 : She returns for follow-up after last visit 6 months ago. She states she is doing well without recurrent stroke or TIA symptoms. She made full recovery from his stroke and no residuals deficits. He however has noticed that her memory difficulties seem to have gotten worse since her stroke. She did have some mild prior short-term memory difficulties which now more apparent. At times she cannot remember names and information but may come back later. At times she has trouble completing sentences. The patient was recently found to have paroxysmal to fibrillation upon interrogation of the pacemaker by her cardiologist  Dr Ammon in [place] and has been switched from Plavix  to eliquis  which is tolerating well with only minor bruising and no significant bleeding. She states her blood pressure is well controlled and today it is 104/55. She states her diabetes is also well controlled. She cannot tell me when last A1c was checked. She is tolerating Pravachol  well without muscle aches or pains.    "

## 2025-01-02 NOTE — Progress Notes (Signed)
 Remote ICD Transmission

## 2025-03-31 ENCOUNTER — Ambulatory Visit: Payer: Medicare Other

## 2025-06-30 ENCOUNTER — Ambulatory Visit: Payer: Medicare Other

## 2025-09-29 ENCOUNTER — Ambulatory Visit: Payer: Medicare Other

## 2025-12-29 ENCOUNTER — Ambulatory Visit: Payer: Medicare Other
